# Patient Record
Sex: Male | Born: 1955 | Race: White | Hispanic: No | Smoking: Current some day smoker
Health system: Southern US, Community
[De-identification: ages and names within clinical notes are randomized; demographics above are authoritative.]

## PROBLEM LIST (undated history)

## (undated) DIAGNOSIS — Z8669 Personal history of other diseases of the nervous system and sense organs: Secondary | ICD-10-CM

## (undated) DIAGNOSIS — G2581 Restless legs syndrome: Secondary | ICD-10-CM

## (undated) DIAGNOSIS — J45909 Unspecified asthma, uncomplicated: Secondary | ICD-10-CM

## (undated) DIAGNOSIS — I2510 Atherosclerotic heart disease of native coronary artery without angina pectoris: Secondary | ICD-10-CM

## (undated) DIAGNOSIS — I5021 Acute systolic (congestive) heart failure: Secondary | ICD-10-CM

## (undated) DIAGNOSIS — H9191 Unspecified hearing loss, right ear: Secondary | ICD-10-CM

## (undated) DIAGNOSIS — S12100A Unspecified displaced fracture of second cervical vertebra, initial encounter for closed fracture: Secondary | ICD-10-CM

## (undated) DIAGNOSIS — I2119 ST elevation (STEMI) myocardial infarction involving other coronary artery of inferior wall: Secondary | ICD-10-CM

## (undated) DIAGNOSIS — M1990 Unspecified osteoarthritis, unspecified site: Secondary | ICD-10-CM

## (undated) DIAGNOSIS — G4700 Insomnia, unspecified: Secondary | ICD-10-CM

## (undated) DIAGNOSIS — J449 Chronic obstructive pulmonary disease, unspecified: Secondary | ICD-10-CM

## (undated) DIAGNOSIS — F419 Anxiety disorder, unspecified: Secondary | ICD-10-CM

## (undated) DIAGNOSIS — I10 Essential (primary) hypertension: Secondary | ICD-10-CM

## (undated) DIAGNOSIS — S7291XA Unspecified fracture of right femur, initial encounter for closed fracture: Secondary | ICD-10-CM

## (undated) DIAGNOSIS — K219 Gastro-esophageal reflux disease without esophagitis: Secondary | ICD-10-CM

## (undated) DIAGNOSIS — E785 Hyperlipidemia, unspecified: Secondary | ICD-10-CM

## (undated) DIAGNOSIS — F329 Major depressive disorder, single episode, unspecified: Secondary | ICD-10-CM

## (undated) HISTORY — DX: Insomnia, unspecified: G47.00

## (undated) HISTORY — PX: TONSILLECTOMY: SUR1361

## (undated) HISTORY — PX: CORONARY ARTERY BYPASS GRAFT: SHX141

## (undated) HISTORY — DX: Anxiety disorder, unspecified: F41.9

## (undated) HISTORY — DX: Chronic obstructive pulmonary disease, unspecified: J44.9

## (undated) HISTORY — DX: Gastro-esophageal reflux disease without esophagitis: K21.9

## (undated) HISTORY — DX: Major depressive disorder, single episode, unspecified: F32.9

## (undated) HISTORY — DX: Unspecified osteoarthritis, unspecified site: M19.90

## (undated) HISTORY — DX: Restless legs syndrome: G25.81

## (undated) HISTORY — DX: Unspecified asthma, uncomplicated: J45.909

## (undated) HISTORY — PX: TRACHEOSTOMY: SUR1362

## (undated) HISTORY — DX: Personal history of other diseases of the nervous system and sense organs: Z86.69

## (undated) HISTORY — DX: Hyperlipidemia, unspecified: E78.5

## (undated) HISTORY — PX: CORONARY STENT PLACEMENT: SHX1402

## (undated) HISTORY — DX: Essential (primary) hypertension: I10

## (undated) HISTORY — DX: Atherosclerotic heart disease of native coronary artery without angina pectoris: I25.10

## (undated) HISTORY — DX: Unspecified hearing loss, right ear: H91.91

---
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
## 1984-03-13 DIAGNOSIS — I10 Essential (primary) hypertension: Secondary | ICD-10-CM

## 1984-03-13 HISTORY — DX: Essential (primary) hypertension: I10

## 1988-03-13 DIAGNOSIS — F32A Depression, unspecified: Secondary | ICD-10-CM

## 1988-03-13 DIAGNOSIS — K219 Gastro-esophageal reflux disease without esophagitis: Secondary | ICD-10-CM

## 1988-03-13 DIAGNOSIS — F419 Anxiety disorder, unspecified: Secondary | ICD-10-CM

## 1988-03-13 HISTORY — DX: Anxiety disorder, unspecified: F41.9

## 1988-03-13 HISTORY — DX: Depression, unspecified: F32.A

## 1988-03-13 HISTORY — DX: Gastro-esophageal reflux disease without esophagitis: K21.9

## 1994-03-13 DIAGNOSIS — E785 Hyperlipidemia, unspecified: Secondary | ICD-10-CM

## 1994-03-13 HISTORY — DX: Hyperlipidemia, unspecified: E78.5

## 1997-10-07 ENCOUNTER — Ambulatory Visit (HOSPITAL_COMMUNITY): Admission: RE | Admit: 1997-10-07 | Discharge: 1997-10-07 | Payer: Self-pay | Admitting: Family Medicine

## 1997-10-26 ENCOUNTER — Encounter: Admission: RE | Admit: 1997-10-26 | Discharge: 1998-01-24 | Payer: Self-pay | Admitting: *Deleted

## 1998-03-02 ENCOUNTER — Inpatient Hospital Stay (HOSPITAL_COMMUNITY): Admission: EM | Admit: 1998-03-02 | Discharge: 1998-03-03 | Payer: Self-pay | Admitting: Emergency Medicine

## 1998-03-02 ENCOUNTER — Encounter: Payer: Self-pay | Admitting: Emergency Medicine

## 1998-03-03 ENCOUNTER — Encounter: Payer: Self-pay | Admitting: Cardiology

## 1999-03-14 DIAGNOSIS — M1990 Unspecified osteoarthritis, unspecified site: Secondary | ICD-10-CM

## 1999-03-14 HISTORY — DX: Unspecified osteoarthritis, unspecified site: M19.90

## 1999-05-06 ENCOUNTER — Encounter: Payer: Self-pay | Admitting: Emergency Medicine

## 1999-05-06 ENCOUNTER — Emergency Department (HOSPITAL_COMMUNITY): Admission: EM | Admit: 1999-05-06 | Discharge: 1999-05-06 | Payer: Self-pay | Admitting: Emergency Medicine

## 1999-05-13 ENCOUNTER — Emergency Department (HOSPITAL_COMMUNITY): Admission: EM | Admit: 1999-05-13 | Discharge: 1999-05-13 | Payer: Self-pay | Admitting: Emergency Medicine

## 2001-05-21 ENCOUNTER — Emergency Department (HOSPITAL_COMMUNITY): Admission: EM | Admit: 2001-05-21 | Discharge: 2001-05-21 | Payer: Self-pay | Admitting: Emergency Medicine

## 2001-05-21 ENCOUNTER — Encounter: Payer: Self-pay | Admitting: Emergency Medicine

## 2001-12-17 ENCOUNTER — Emergency Department (HOSPITAL_COMMUNITY): Admission: EM | Admit: 2001-12-17 | Discharge: 2001-12-17 | Payer: Self-pay | Admitting: Emergency Medicine

## 2001-12-17 ENCOUNTER — Encounter: Payer: Self-pay | Admitting: Emergency Medicine

## 2002-02-24 ENCOUNTER — Ambulatory Visit (HOSPITAL_COMMUNITY): Admission: RE | Admit: 2002-02-24 | Discharge: 2002-02-24 | Payer: Self-pay | Admitting: Internal Medicine

## 2002-02-24 ENCOUNTER — Encounter: Payer: Self-pay | Admitting: Internal Medicine

## 2002-08-13 ENCOUNTER — Encounter: Payer: Self-pay | Admitting: *Deleted

## 2002-08-13 ENCOUNTER — Encounter: Admission: RE | Admit: 2002-08-13 | Discharge: 2002-08-13 | Payer: Self-pay | Admitting: *Deleted

## 2002-08-26 ENCOUNTER — Encounter: Admission: RE | Admit: 2002-08-26 | Discharge: 2002-08-26 | Payer: Self-pay | Admitting: *Deleted

## 2002-08-26 ENCOUNTER — Encounter: Payer: Self-pay | Admitting: *Deleted

## 2002-09-09 ENCOUNTER — Encounter: Payer: Self-pay | Admitting: *Deleted

## 2002-09-09 ENCOUNTER — Encounter: Admission: RE | Admit: 2002-09-09 | Discharge: 2002-09-09 | Payer: Self-pay | Admitting: *Deleted

## 2004-01-26 ENCOUNTER — Encounter: Admission: RE | Admit: 2004-01-26 | Discharge: 2004-01-26 | Payer: Self-pay | Admitting: Family Medicine

## 2004-02-12 ENCOUNTER — Encounter: Admission: RE | Admit: 2004-02-12 | Discharge: 2004-02-12 | Payer: Self-pay | Admitting: Family Medicine

## 2004-02-25 ENCOUNTER — Encounter: Admission: RE | Admit: 2004-02-25 | Discharge: 2004-02-25 | Payer: Self-pay | Admitting: Family Medicine

## 2004-03-13 DIAGNOSIS — J449 Chronic obstructive pulmonary disease, unspecified: Secondary | ICD-10-CM

## 2004-03-13 HISTORY — DX: Chronic obstructive pulmonary disease, unspecified: J44.9

## 2004-08-19 ENCOUNTER — Encounter: Admission: RE | Admit: 2004-08-19 | Discharge: 2004-08-19 | Payer: Self-pay | Admitting: Family Medicine

## 2010-05-18 DIAGNOSIS — R0609 Other forms of dyspnea: Secondary | ICD-10-CM | POA: Insufficient documentation

## 2010-05-18 DIAGNOSIS — R0602 Shortness of breath: Secondary | ICD-10-CM | POA: Insufficient documentation

## 2010-09-12 DIAGNOSIS — R059 Cough, unspecified: Secondary | ICD-10-CM | POA: Insufficient documentation

## 2010-09-12 DIAGNOSIS — J40 Bronchitis, not specified as acute or chronic: Secondary | ICD-10-CM | POA: Insufficient documentation

## 2010-09-12 DIAGNOSIS — R05 Cough: Secondary | ICD-10-CM | POA: Insufficient documentation

## 2012-03-20 DIAGNOSIS — Z1389 Encounter for screening for other disorder: Secondary | ICD-10-CM | POA: Insufficient documentation

## 2012-12-07 DIAGNOSIS — E785 Hyperlipidemia, unspecified: Secondary | ICD-10-CM | POA: Insufficient documentation

## 2012-12-11 DIAGNOSIS — I2510 Atherosclerotic heart disease of native coronary artery without angina pectoris: Secondary | ICD-10-CM

## 2012-12-11 HISTORY — DX: Atherosclerotic heart disease of native coronary artery without angina pectoris: I25.10

## 2013-01-16 DIAGNOSIS — I10 Essential (primary) hypertension: Secondary | ICD-10-CM | POA: Insufficient documentation

## 2013-02-12 DIAGNOSIS — Z951 Presence of aortocoronary bypass graft: Secondary | ICD-10-CM | POA: Insufficient documentation

## 2013-05-07 DIAGNOSIS — I2119 ST elevation (STEMI) myocardial infarction involving other coronary artery of inferior wall: Secondary | ICD-10-CM | POA: Insufficient documentation

## 2013-05-07 HISTORY — DX: ST elevation (STEMI) myocardial infarction involving other coronary artery of inferior wall: I21.19

## 2013-08-22 DIAGNOSIS — N179 Acute kidney failure, unspecified: Secondary | ICD-10-CM | POA: Insufficient documentation

## 2014-03-13 DIAGNOSIS — H9191 Unspecified hearing loss, right ear: Secondary | ICD-10-CM

## 2014-03-13 HISTORY — PX: ORIF FEMUR FRACTURE: SHX2119

## 2014-03-13 HISTORY — PX: CERVICAL FUSION: SHX112

## 2014-03-13 HISTORY — DX: Unspecified hearing loss, right ear: H91.91

## 2014-08-15 DIAGNOSIS — F329 Major depressive disorder, single episode, unspecified: Secondary | ICD-10-CM | POA: Insufficient documentation

## 2014-08-15 DIAGNOSIS — F32A Depression, unspecified: Secondary | ICD-10-CM | POA: Insufficient documentation

## 2014-09-20 DIAGNOSIS — S0101XA Laceration without foreign body of scalp, initial encounter: Secondary | ICD-10-CM | POA: Insufficient documentation

## 2014-09-20 DIAGNOSIS — S12100A Unspecified displaced fracture of second cervical vertebra, initial encounter for closed fracture: Secondary | ICD-10-CM

## 2014-09-20 DIAGNOSIS — T1490XA Injury, unspecified, initial encounter: Secondary | ICD-10-CM | POA: Insufficient documentation

## 2014-09-20 HISTORY — DX: Unspecified displaced fracture of second cervical vertebra, initial encounter for closed fracture: S12.100A

## 2014-09-22 DIAGNOSIS — S7291XA Unspecified fracture of right femur, initial encounter for closed fracture: Secondary | ICD-10-CM | POA: Insufficient documentation

## 2014-09-22 DIAGNOSIS — J810 Acute pulmonary edema: Secondary | ICD-10-CM | POA: Insufficient documentation

## 2014-09-22 DIAGNOSIS — N179 Acute kidney failure, unspecified: Secondary | ICD-10-CM | POA: Insufficient documentation

## 2014-09-22 DIAGNOSIS — M532X2 Spinal instabilities, cervical region: Secondary | ICD-10-CM | POA: Insufficient documentation

## 2014-09-22 HISTORY — DX: Unspecified fracture of right femur, initial encounter for closed fracture: S72.91XA

## 2015-01-04 ENCOUNTER — Encounter: Payer: Self-pay | Admitting: Physician Assistant

## 2015-01-04 ENCOUNTER — Ambulatory Visit: Payer: Self-pay | Admitting: Physician Assistant

## 2015-01-04 VITALS — BP 168/100 | HR 102 | Temp 98.2°F | Ht 71.0 in | Wt 206.6 lb

## 2015-01-04 DIAGNOSIS — E785 Hyperlipidemia, unspecified: Secondary | ICD-10-CM

## 2015-01-04 DIAGNOSIS — I2510 Atherosclerotic heart disease of native coronary artery without angina pectoris: Secondary | ICD-10-CM | POA: Insufficient documentation

## 2015-01-04 DIAGNOSIS — Z131 Encounter for screening for diabetes mellitus: Secondary | ICD-10-CM

## 2015-01-04 DIAGNOSIS — I10 Essential (primary) hypertension: Secondary | ICD-10-CM | POA: Insufficient documentation

## 2015-01-04 DIAGNOSIS — J449 Chronic obstructive pulmonary disease, unspecified: Secondary | ICD-10-CM

## 2015-01-04 LAB — GLUCOSE, POCT (MANUAL RESULT ENTRY): POC Glucose: 108 mg/dl — AB (ref 70–99)

## 2015-01-04 MED ORDER — OMEPRAZOLE 20 MG PO CPDR: 20.0000 mg | DELAYED_RELEASE_CAPSULE | Freq: Every day | ORAL | Status: DC

## 2015-01-04 MED ORDER — METOPROLOL SUCCINATE ER 25 MG PO TB24: 25.0000 mg | ORAL_TABLET | Freq: Every day | ORAL | Status: DC

## 2015-01-04 MED ORDER — CLOPIDOGREL BISULFATE 75 MG PO TABS: 75.0000 mg | ORAL_TABLET | Freq: Every day | ORAL | Status: DC

## 2015-01-04 MED ORDER — ATORVASTATIN CALCIUM 80 MG PO TABS: 80.0000 mg | ORAL_TABLET | Freq: Every day | ORAL | Status: DC
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
## 2015-02-09 ENCOUNTER — Ambulatory Visit: Payer: Self-pay | Admitting: Physician Assistant

## 2015-03-14 DIAGNOSIS — I719 Aortic aneurysm of unspecified site, without rupture: Secondary | ICD-10-CM | POA: Insufficient documentation

## 2015-03-14 HISTORY — DX: Aortic aneurysm of unspecified site, without rupture: I71.9

## 2015-04-09 ENCOUNTER — Other Ambulatory Visit: Payer: Self-pay | Admitting: Physician Assistant

## 2015-11-22 DIAGNOSIS — F17200 Nicotine dependence, unspecified, uncomplicated: Secondary | ICD-10-CM | POA: Insufficient documentation

## 2015-11-22 DIAGNOSIS — J449 Chronic obstructive pulmonary disease, unspecified: Secondary | ICD-10-CM | POA: Insufficient documentation

## 2015-11-25 ENCOUNTER — Ambulatory Visit (HOSPITAL_COMMUNITY)
Admission: RE | Admit: 2015-11-25 | Discharge: 2015-11-25 | Disposition: A | Discharge status: Home / Self Care | Payer: Medicaid Other | Source: Ambulatory Visit | Attending: Physician Assistant | Admitting: Physician Assistant

## 2015-11-25 ENCOUNTER — Other Ambulatory Visit (HOSPITAL_COMMUNITY): Payer: Self-pay | Admitting: Physician Assistant

## 2015-11-25 DIAGNOSIS — R918 Other nonspecific abnormal finding of lung field: Secondary | ICD-10-CM | POA: Diagnosis not present

## 2015-11-25 DIAGNOSIS — R0602 Shortness of breath: Secondary | ICD-10-CM

## 2015-11-25 DIAGNOSIS — I700 Atherosclerosis of aorta: Secondary | ICD-10-CM | POA: Insufficient documentation

## 2015-11-29 ENCOUNTER — Other Ambulatory Visit (HOSPITAL_COMMUNITY): Payer: Self-pay | Admitting: Physician Assistant

## 2015-11-29 DIAGNOSIS — R918 Other nonspecific abnormal finding of lung field: Secondary | ICD-10-CM

## 2015-12-07 ENCOUNTER — Ambulatory Visit (HOSPITAL_COMMUNITY)
Admission: RE | Admit: 2015-12-07 | Discharge: 2015-12-07 | Disposition: A | Discharge status: Home / Self Care | Payer: Medicaid Other | Source: Ambulatory Visit | Attending: Physician Assistant | Admitting: Physician Assistant

## 2015-12-07 DIAGNOSIS — I2510 Atherosclerotic heart disease of native coronary artery without angina pectoris: Secondary | ICD-10-CM | POA: Insufficient documentation

## 2015-12-07 DIAGNOSIS — I700 Atherosclerosis of aorta: Secondary | ICD-10-CM | POA: Diagnosis not present

## 2015-12-07 DIAGNOSIS — Z951 Presence of aortocoronary bypass graft: Secondary | ICD-10-CM | POA: Insufficient documentation

## 2015-12-07 DIAGNOSIS — I712 Thoracic aortic aneurysm, without rupture: Secondary | ICD-10-CM | POA: Diagnosis not present

## 2015-12-07 DIAGNOSIS — R918 Other nonspecific abnormal finding of lung field: Secondary | ICD-10-CM | POA: Insufficient documentation

## 2015-12-07 MED ORDER — IOPAMIDOL (ISOVUE-300) INJECTION 61%
75.0000 mL | Freq: Once | INTRAVENOUS | Status: AC | PRN
  Administered 2015-12-07: 75 mL via INTRAVENOUS

## 2015-12-14 ENCOUNTER — Ambulatory Visit (HOSPITAL_COMMUNITY): Payer: Medicaid Other

## 2015-12-21 ENCOUNTER — Encounter: Payer: Self-pay | Admitting: Thoracic Surgery (Cardiothoracic Vascular Surgery)

## 2015-12-21 ENCOUNTER — Institutional Professional Consult (permissible substitution) (INDEPENDENT_AMBULATORY_CARE_PROVIDER_SITE_OTHER): Payer: Medicaid Other | Admitting: Thoracic Surgery (Cardiothoracic Vascular Surgery)

## 2015-12-21 VITALS — BP 111/77 | HR 89 | Resp 20 | Ht 71.0 in | Wt 218.0 lb

## 2015-12-21 DIAGNOSIS — I712 Thoracic aortic aneurysm, without rupture, unspecified: Secondary | ICD-10-CM

## 2015-12-21 DIAGNOSIS — R079 Chest pain, unspecified: Secondary | ICD-10-CM | POA: Insufficient documentation
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
## 2015-12-28 ENCOUNTER — Institutional Professional Consult (permissible substitution): Payer: Medicaid Other | Admitting: Internal Medicine

## 2016-01-13 ENCOUNTER — Institutional Professional Consult (permissible substitution): Payer: Medicaid Other | Admitting: Internal Medicine

## 2016-11-29 ENCOUNTER — Other Ambulatory Visit: Payer: Self-pay | Admitting: *Deleted

## 2016-11-29 DIAGNOSIS — I712 Thoracic aortic aneurysm, without rupture, unspecified: Secondary | ICD-10-CM

## 2017-01-09 ENCOUNTER — Inpatient Hospital Stay: Admission: RE | Admit: 2017-01-09 | Payer: Self-pay | Source: Ambulatory Visit

## 2017-01-09 ENCOUNTER — Encounter: Payer: Self-pay | Admitting: Thoracic Surgery (Cardiothoracic Vascular Surgery)

## 2017-03-11 ENCOUNTER — Emergency Department (HOSPITAL_COMMUNITY): Payer: Medicaid Other

## 2017-03-11 ENCOUNTER — Encounter (HOSPITAL_COMMUNITY)
Admission: EM | Disposition: A | Discharge status: Home / Self Care | Payer: Self-pay | Source: Home / Self Care | Attending: Cardiovascular Disease

## 2017-03-11 ENCOUNTER — Encounter (HOSPITAL_COMMUNITY): Payer: Self-pay | Admitting: *Deleted

## 2017-03-11 ENCOUNTER — Inpatient Hospital Stay (HOSPITAL_COMMUNITY)
Admission: EM | Admit: 2017-03-11 | Discharge: 2017-03-14 | DRG: 228 | Disposition: A | Discharge status: Home / Self Care | Payer: Medicaid Other | Attending: Cardiovascular Disease | Admitting: Cardiovascular Disease

## 2017-03-11 DIAGNOSIS — Z79899 Other long term (current) drug therapy: Secondary | ICD-10-CM

## 2017-03-11 DIAGNOSIS — I110 Hypertensive heart disease with heart failure: Secondary | ICD-10-CM | POA: Diagnosis present

## 2017-03-11 DIAGNOSIS — Z955 Presence of coronary angioplasty implant and graft: Secondary | ICD-10-CM | POA: Diagnosis not present

## 2017-03-11 DIAGNOSIS — Z9114 Patient's other noncompliance with medication regimen: Secondary | ICD-10-CM

## 2017-03-11 DIAGNOSIS — F419 Anxiety disorder, unspecified: Secondary | ICD-10-CM | POA: Diagnosis present

## 2017-03-11 DIAGNOSIS — I2119 ST elevation (STEMI) myocardial infarction involving other coronary artery of inferior wall: Secondary | ICD-10-CM | POA: Diagnosis present

## 2017-03-11 DIAGNOSIS — I10 Essential (primary) hypertension: Secondary | ICD-10-CM | POA: Diagnosis present

## 2017-03-11 DIAGNOSIS — Z981 Arthrodesis status: Secondary | ICD-10-CM

## 2017-03-11 DIAGNOSIS — F17210 Nicotine dependence, cigarettes, uncomplicated: Secondary | ICD-10-CM | POA: Diagnosis present

## 2017-03-11 DIAGNOSIS — I5023 Acute on chronic systolic (congestive) heart failure: Secondary | ICD-10-CM | POA: Diagnosis present

## 2017-03-11 DIAGNOSIS — J440 Chronic obstructive pulmonary disease with acute lower respiratory infection: Secondary | ICD-10-CM | POA: Diagnosis present

## 2017-03-11 DIAGNOSIS — Z9119 Patient's noncompliance with other medical treatment and regimen: Secondary | ICD-10-CM

## 2017-03-11 DIAGNOSIS — Z599 Problem related to housing and economic circumstances, unspecified: Secondary | ICD-10-CM

## 2017-03-11 DIAGNOSIS — J189 Pneumonia, unspecified organism: Secondary | ICD-10-CM | POA: Diagnosis present

## 2017-03-11 DIAGNOSIS — K219 Gastro-esophageal reflux disease without esophagitis: Secondary | ICD-10-CM | POA: Diagnosis present

## 2017-03-11 DIAGNOSIS — I712 Thoracic aortic aneurysm, without rupture: Secondary | ICD-10-CM | POA: Diagnosis present

## 2017-03-11 DIAGNOSIS — I252 Old myocardial infarction: Secondary | ICD-10-CM

## 2017-03-11 DIAGNOSIS — F329 Major depressive disorder, single episode, unspecified: Secondary | ICD-10-CM | POA: Diagnosis present

## 2017-03-11 DIAGNOSIS — Z9981 Dependence on supplemental oxygen: Secondary | ICD-10-CM

## 2017-03-11 DIAGNOSIS — I5043 Acute on chronic combined systolic (congestive) and diastolic (congestive) heart failure: Secondary | ICD-10-CM | POA: Diagnosis present

## 2017-03-11 DIAGNOSIS — I213 ST elevation (STEMI) myocardial infarction of unspecified site: Secondary | ICD-10-CM

## 2017-03-11 DIAGNOSIS — H9191 Unspecified hearing loss, right ear: Secondary | ICD-10-CM | POA: Diagnosis present

## 2017-03-11 DIAGNOSIS — I2111 ST elevation (STEMI) myocardial infarction involving right coronary artery: Principal | ICD-10-CM | POA: Diagnosis present

## 2017-03-11 DIAGNOSIS — R0602 Shortness of breath: Secondary | ICD-10-CM

## 2017-03-11 DIAGNOSIS — Z8249 Family history of ischemic heart disease and other diseases of the circulatory system: Secondary | ICD-10-CM | POA: Diagnosis not present

## 2017-03-11 DIAGNOSIS — J449 Chronic obstructive pulmonary disease, unspecified: Secondary | ICD-10-CM

## 2017-03-11 DIAGNOSIS — I25709 Atherosclerosis of coronary artery bypass graft(s), unspecified, with unspecified angina pectoris: Secondary | ICD-10-CM | POA: Diagnosis present

## 2017-03-11 DIAGNOSIS — E785 Hyperlipidemia, unspecified: Secondary | ICD-10-CM | POA: Diagnosis present

## 2017-03-11 DIAGNOSIS — I2510 Atherosclerotic heart disease of native coronary artery without angina pectoris: Secondary | ICD-10-CM | POA: Diagnosis present

## 2017-03-11 DIAGNOSIS — Z951 Presence of aortocoronary bypass graft: Secondary | ICD-10-CM

## 2017-03-11 HISTORY — DX: Acute systolic (congestive) heart failure: I50.21

## 2017-03-11 HISTORY — PX: CORONARY/GRAFT ACUTE MI REVASCULARIZATION: CATH118305

## 2017-03-11 HISTORY — PX: LEFT HEART CATH AND CORONARY ANGIOGRAPHY: CATH118249

## 2017-03-11 HISTORY — PX: CORONARY THROMBECTOMY: CATH118304

## 2017-03-11 HISTORY — PX: CORONARY STENT INTERVENTION: CATH118234

## 2017-03-11 LAB — BASIC METABOLIC PANEL
ANION GAP: 13 (ref 5–15)
BUN: 16 mg/dL (ref 6–20)
CALCIUM: 9.1 mg/dL (ref 8.9–10.3)
CO2: 23 mmol/L (ref 22–32)
Chloride: 102 mmol/L (ref 101–111)
Creatinine, Ser: 1.21 mg/dL (ref 0.61–1.24)
Glucose, Bld: 109 mg/dL — ABNORMAL HIGH (ref 65–99)
POTASSIUM: 4.1 mmol/L (ref 3.5–5.1)
Sodium: 138 mmol/L (ref 135–145)

## 2017-03-11 LAB — CBC WITH DIFFERENTIAL/PLATELET
BASOS ABS: 0 10*3/uL (ref 0.0–0.1)
BASOS PCT: 0 %
Eosinophils Absolute: 0.2 10*3/uL (ref 0.0–0.7)
Eosinophils Relative: 1 %
HEMATOCRIT: 50.3 % (ref 39.0–52.0)
HEMOGLOBIN: 17.1 g/dL — AB (ref 13.0–17.0)
LYMPHS PCT: 26 %
Lymphs Abs: 3.7 10*3/uL (ref 0.7–4.0)
MCH: 31.8 pg (ref 26.0–34.0)
MCHC: 34 g/dL (ref 30.0–36.0)
MCV: 93.7 fL (ref 78.0–100.0)
Monocytes Absolute: 0.5 10*3/uL (ref 0.1–1.0)
Monocytes Relative: 4 %
NEUTROS ABS: 9.6 10*3/uL — AB (ref 1.7–7.7)
NEUTROS PCT: 69 %
Platelets: 271 10*3/uL (ref 150–400)
RBC: 5.37 MIL/uL (ref 4.22–5.81)
RDW: 14 % (ref 11.5–15.5)
WBC: 14 10*3/uL — ABNORMAL HIGH (ref 4.0–10.5)

## 2017-03-11 LAB — I-STAT CHEM 8, ED
BUN: 19 mg/dL (ref 6–20)
Calcium, Ion: 1.06 mmol/L — ABNORMAL LOW (ref 1.15–1.40)
Chloride: 104 mmol/L (ref 101–111)
Creatinine, Ser: 1.2 mg/dL (ref 0.61–1.24)
GLUCOSE: 110 mg/dL — AB (ref 65–99)
HEMATOCRIT: 53 % — AB (ref 39.0–52.0)
HEMOGLOBIN: 18 g/dL — AB (ref 13.0–17.0)
POTASSIUM: 4.2 mmol/L (ref 3.5–5.1)
SODIUM: 140 mmol/L (ref 135–145)
TCO2: 23 mmol/L (ref 22–32)

## 2017-03-11 LAB — HEMOGLOBIN A1C
HEMOGLOBIN A1C: 5.3 % (ref 4.8–5.6)
MEAN PLASMA GLUCOSE: 105.41 mg/dL

## 2017-03-11 LAB — TROPONIN I: TROPONIN I: 0.04 ng/mL — AB (ref <0.03)

## 2017-03-11 LAB — I-STAT TROPONIN, ED: Troponin i, poc: 0.01 ng/mL (ref 0.00–0.08)

## 2017-03-11 LAB — BRAIN NATRIURETIC PEPTIDE: B NATRIURETIC PEPTIDE 5: 43.8 pg/mL (ref 0.0–100.0)

## 2017-03-11 SURGERY — LEFT HEART CATH AND CORONARY ANGIOGRAPHY
Anesthesia: LOCAL

## 2017-03-11 MED ORDER — IOPAMIDOL (ISOVUE-370) INJECTION 76%
INTRAVENOUS | Status: AC
  Filled 2017-03-11: qty 100

## 2017-03-11 MED ORDER — HEPARIN SODIUM (PORCINE) 1000 UNIT/ML IJ SOLN
INTRAMUSCULAR | Status: AC
  Filled 2017-03-11: qty 1

## 2017-03-11 MED ORDER — FENTANYL CITRATE (PF) 100 MCG/2ML IJ SOLN
INTRAMUSCULAR | Status: AC
  Filled 2017-03-11: qty 2

## 2017-03-11 MED ORDER — ADENOSINE 6 MG/2ML IV SOLN
INTRAVENOUS | Status: AC
  Filled 2017-03-11: qty 2

## 2017-03-11 MED ORDER — TICAGRELOR 90 MG PO TABS
ORAL_TABLET | ORAL | Status: AC
  Filled 2017-03-11: qty 2

## 2017-03-11 MED ORDER — ASPIRIN EC 81 MG PO TBEC
81.0000 mg | DELAYED_RELEASE_TABLET | Freq: Every day | ORAL | Status: DC
  Administered 2017-03-12 – 2017-03-14 (×3): 81 mg via ORAL
  Filled 2017-03-11 (×3): qty 1

## 2017-03-11 MED ORDER — LABETALOL HCL 5 MG/ML IV SOLN: 10.0000 mg | INTRAVENOUS | Status: AC | PRN

## 2017-03-11 MED ORDER — VERAPAMIL HCL 2.5 MG/ML IV SOLN
INTRAVENOUS | Status: DC | PRN
  Administered 2017-03-11: 100 ug via INTRACORONARY
  Administered 2017-03-11: 200 ug via INTRACORONARY
  Administered 2017-03-11: 100 ug via INTRACORONARY
  Administered 2017-03-11: 200 ug via INTRACORONARY
  Administered 2017-03-11 (×4): 100 ug via INTRACORONARY

## 2017-03-11 MED ORDER — HYDRALAZINE HCL 20 MG/ML IJ SOLN
5.0000 mg | INTRAMUSCULAR | Status: AC | PRN
  Filled 2017-03-11: qty 1

## 2017-03-11 MED ORDER — MIDAZOLAM HCL 2 MG/2ML IJ SOLN
INTRAMUSCULAR | Status: AC
  Filled 2017-03-11: qty 2

## 2017-03-11 MED ORDER — MIDAZOLAM HCL 2 MG/2ML IJ SOLN
INTRAMUSCULAR | Status: DC | PRN
  Administered 2017-03-11 (×2): 1 mg via INTRAVENOUS

## 2017-03-11 MED ORDER — LIDOCAINE HCL (PF) 1 % IJ SOLN
INTRAMUSCULAR | Status: AC
  Filled 2017-03-11: qty 30

## 2017-03-11 MED ORDER — NITROGLYCERIN 1 MG/10 ML FOR IR/CATH LAB
INTRA_ARTERIAL | Status: AC
  Filled 2017-03-11: qty 10

## 2017-03-11 MED ORDER — LIDOCAINE HCL (PF) 1 % IJ SOLN
INTRAMUSCULAR | Status: DC | PRN
  Administered 2017-03-11: 15 mL

## 2017-03-11 MED ORDER — NITROGLYCERIN 0.4 MG SL SUBL: 0.4000 mg | SUBLINGUAL_TABLET | SUBLINGUAL | Status: DC | PRN

## 2017-03-11 MED ORDER — BIVALIRUDIN TRIFLUOROACETATE 250 MG IV SOLR
INTRAVENOUS | Status: AC
  Filled 2017-03-11: qty 250

## 2017-03-11 MED ORDER — FENTANYL CITRATE (PF) 100 MCG/2ML IJ SOLN
INTRAMUSCULAR | Status: DC | PRN
  Administered 2017-03-11 (×2): 25 ug via INTRAVENOUS

## 2017-03-11 MED ORDER — METOPROLOL SUCCINATE ER 25 MG PO TB24
25.0000 mg | ORAL_TABLET | Freq: Every day | ORAL | Status: DC
  Administered 2017-03-12 – 2017-03-14 (×3): 25 mg via ORAL
  Filled 2017-03-11 (×3): qty 1

## 2017-03-11 MED ORDER — PANTOPRAZOLE SODIUM 40 MG PO TBEC
40.0000 mg | DELAYED_RELEASE_TABLET | Freq: Every day | ORAL | Status: DC
  Administered 2017-03-12 – 2017-03-14 (×3): 40 mg via ORAL
  Filled 2017-03-11 (×3): qty 1

## 2017-03-11 MED ORDER — HEPARIN (PORCINE) IN NACL 2-0.9 UNIT/ML-% IJ SOLN
INTRAMUSCULAR | Status: AC
  Filled 2017-03-11: qty 500

## 2017-03-11 MED ORDER — SODIUM CHLORIDE 0.9 % WEIGHT BASED INFUSION
1.0000 mL/kg/h | INTRAVENOUS | Status: AC
  Administered 2017-03-11: 1 mL/kg/h via INTRAVENOUS

## 2017-03-11 MED ORDER — SODIUM CHLORIDE 0.9 % IV SOLN
INTRAVENOUS | Status: AC | PRN
  Administered 2017-03-11 (×2): 1.75 mg/kg/h via INTRAVENOUS

## 2017-03-11 MED ORDER — SODIUM CHLORIDE 0.9% FLUSH
3.0000 mL | INTRAVENOUS | Status: DC | PRN
  Administered 2017-03-14: 3 mL via INTRAVENOUS
  Filled 2017-03-11: qty 3

## 2017-03-11 MED ORDER — LIDOCAINE-EPINEPHRINE 1 %-1:100000 IJ SOLN
INTRAMUSCULAR | Status: AC
Start: 2017-03-11 — End: ?
  Filled 2017-03-11: qty 1

## 2017-03-11 MED ORDER — ONDANSETRON HCL 4 MG/2ML IJ SOLN
4.0000 mg | Freq: Four times a day (QID) | INTRAMUSCULAR | Status: DC | PRN
  Administered 2017-03-12 – 2017-03-13 (×3): 4 mg via INTRAVENOUS
  Filled 2017-03-11 (×3): qty 2

## 2017-03-11 MED ORDER — OXYCODONE HCL 5 MG PO TABS
5.0000 mg | ORAL_TABLET | ORAL | Status: DC | PRN
  Administered 2017-03-13: 5 mg via ORAL
  Filled 2017-03-11: qty 1

## 2017-03-11 MED ORDER — VERAPAMIL HCL 2.5 MG/ML IV SOLN
INTRAVENOUS | Status: AC
  Filled 2017-03-11: qty 2

## 2017-03-11 MED ORDER — VERAPAMIL HCL 2.5 MG/ML IV SOLN
INTRAVENOUS | Status: DC | PRN
  Administered 2017-03-11: 200 ug via INTRACORONARY
  Administered 2017-03-11 (×2): 100 ug via INTRACORONARY

## 2017-03-11 MED ORDER — SODIUM CHLORIDE 0.9 % IV SOLN: 250.0000 mL | INTRAVENOUS | Status: DC | PRN

## 2017-03-11 MED ORDER — ESCITALOPRAM OXALATE 20 MG PO TABS
20.0000 mg | ORAL_TABLET | Freq: Every day | ORAL | Status: DC
  Administered 2017-03-12 – 2017-03-14 (×3): 20 mg via ORAL
  Filled 2017-03-11: qty 2
  Filled 2017-03-11: qty 1
  Filled 2017-03-11: qty 2

## 2017-03-11 MED ORDER — AMITRIPTYLINE HCL 10 MG PO TABS
10.0000 mg | ORAL_TABLET | Freq: Every day | ORAL | Status: DC
Start: 2017-03-12 — End: 2017-03-14
  Administered 2017-03-12 – 2017-03-14 (×3): 10 mg via ORAL
  Filled 2017-03-11 (×3): qty 1

## 2017-03-11 MED ORDER — ACETAMINOPHEN 325 MG PO TABS: 650.0000 mg | ORAL_TABLET | ORAL | Status: DC | PRN

## 2017-03-11 MED ORDER — HEPARIN BOLUS VIA INFUSION: 4000.0000 [IU] | Freq: Once | INTRAVENOUS | Status: DC

## 2017-03-11 MED ORDER — TICAGRELOR 90 MG PO TABS
ORAL_TABLET | ORAL | Status: DC | PRN
  Administered 2017-03-11: 180 mg via ORAL

## 2017-03-11 MED ORDER — HEPARIN (PORCINE) IN NACL 2-0.9 UNIT/ML-% IJ SOLN
INTRAMUSCULAR | Status: AC
Start: 2017-03-11 — End: ?
  Filled 2017-03-11: qty 1000

## 2017-03-11 MED ORDER — BIVALIRUDIN BOLUS VIA INFUSION - CUPID
INTRAVENOUS | Status: DC | PRN
  Administered 2017-03-11: 74.25 mg via INTRAVENOUS

## 2017-03-11 MED ORDER — ROPINIROLE HCL 1 MG PO TABS
1.0000 mg | ORAL_TABLET | Freq: Every day | ORAL | Status: DC
  Administered 2017-03-12 – 2017-03-14 (×3): 1 mg via ORAL
  Filled 2017-03-11 (×3): qty 1

## 2017-03-11 MED ORDER — NITROGLYCERIN 1 MG/10 ML FOR IR/CATH LAB
INTRA_ARTERIAL | Status: DC | PRN
  Administered 2017-03-11: 200 ug via INTRACORONARY

## 2017-03-11 MED ORDER — MORPHINE SULFATE (PF) 4 MG/ML IV SOLN
2.0000 mg | INTRAVENOUS | Status: DC | PRN
  Administered 2017-03-12 (×4): 2 mg via INTRAVENOUS
  Filled 2017-03-11 (×4): qty 1

## 2017-03-11 MED ORDER — ATORVASTATIN CALCIUM 80 MG PO TABS
80.0000 mg | ORAL_TABLET | Freq: Every day | ORAL | Status: DC
  Administered 2017-03-12 – 2017-03-14 (×3): 80 mg via ORAL
  Filled 2017-03-11 (×3): qty 1

## 2017-03-11 MED ORDER — TICAGRELOR 90 MG PO TABS
90.0000 mg | ORAL_TABLET | Freq: Two times a day (BID) | ORAL | Status: DC
  Administered 2017-03-12 – 2017-03-13 (×3): 90 mg via ORAL
  Filled 2017-03-11 (×3): qty 1

## 2017-03-11 MED ORDER — AMLODIPINE BESYLATE 10 MG PO TABS
10.0000 mg | ORAL_TABLET | Freq: Every day | ORAL | Status: DC
  Administered 2017-03-12 – 2017-03-13 (×2): 10 mg via ORAL
  Filled 2017-03-11 (×2): qty 1

## 2017-03-11 MED ORDER — IOPAMIDOL (ISOVUE-370) INJECTION 76%
INTRAVENOUS | Status: DC | PRN
  Administered 2017-03-11: 160 mL via INTRA_ARTERIAL

## 2017-03-11 MED ORDER — LIDOCAINE-EPINEPHRINE 1 %-1:100000 IJ SOLN
INTRAMUSCULAR | Status: DC | PRN
  Administered 2017-03-11: 3 mL

## 2017-03-11 MED ORDER — SODIUM CHLORIDE 0.9% FLUSH
3.0000 mL | Freq: Two times a day (BID) | INTRAVENOUS | Status: DC
  Administered 2017-03-12 – 2017-03-14 (×4): 3 mL via INTRAVENOUS

## 2017-03-11 MED ORDER — HEPARIN (PORCINE) IN NACL 2-0.9 UNIT/ML-% IJ SOLN
INTRAMUSCULAR | Status: AC | PRN
  Administered 2017-03-11: 1000 mL

## 2017-03-11 SURGICAL SUPPLY — 23 items
BALLN SAPPHIRE 2.5X12 (BALLOONS) ×2
BALLOON SAPPHIRE 2.5X12 (BALLOONS) ×1 IMPLANT
CATH ANGIOJET SPIRO ULTR 135CM (CATHETERS) ×2 IMPLANT
CATH INFINITI 5 FR JL6.0 (CATHETERS) ×2 IMPLANT
CATH INFINITI 5FR ANG PIGTAIL (CATHETERS) ×2 IMPLANT
CATH INFINITI 5FR JL4 (CATHETERS) ×2 IMPLANT
CATH INFINITI 5FR JL5 (CATHETERS) ×2 IMPLANT
CATH INFINITI JR4 5F (CATHETERS) ×2 IMPLANT
CATH S G BIP PACING (SET/KITS/TRAYS/PACK) ×2 IMPLANT
CATH VISTA GUIDE RCB (CATHETERS) ×2 IMPLANT
DEVICE CLOSURE PERCLS PRGLD 6F (VASCULAR PRODUCTS) ×1 IMPLANT
KIT ENCORE 26 ADVANTAGE (KITS) ×2 IMPLANT
KIT HEART LEFT (KITS) ×2 IMPLANT
PACK CARDIAC CATHETERIZATION (CUSTOM PROCEDURE TRAY) ×2 IMPLANT
PERCLOSE PROGLIDE 6F (VASCULAR PRODUCTS) ×2
SHEATH PINNACLE 5F 10CM (SHEATH) ×2 IMPLANT
SHEATH PINNACLE 6F 10CM (SHEATH) ×4 IMPLANT
SLEEVE REPOSITIONING LENGTH 30 (MISCELLANEOUS) ×2 IMPLANT
STENT SYNERGY DES 3.5X16 (Permanent Stent) ×2 IMPLANT
TRANSDUCER W/STOPCOCK (MISCELLANEOUS) ×2 IMPLANT
TUBING CIL FLEX 10 FLL-RA (TUBING) ×2 IMPLANT
VALVE GUARDIAN II ~~LOC~~ HEMO (MISCELLANEOUS) ×2 IMPLANT
WIRE COUGAR XT STRL 190CM (WIRE) ×2 IMPLANT
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
## 2017-03-11 NOTE — ED Triage Notes (Signed)
Pt reports severe cp that radiates to his L arm around 1730 today thinking it was acid refux.  Pt has hx of cardiac stents and CABG in 2014.

## 2017-03-11 NOTE — ED Notes (Signed)
Pt received Morphine 10mg , ASA 81mg  x 3, nitro SL x 1 en route

## 2017-03-12 ENCOUNTER — Inpatient Hospital Stay (HOSPITAL_COMMUNITY): Payer: Medicaid Other

## 2017-03-12 ENCOUNTER — Encounter (HOSPITAL_COMMUNITY): Payer: Self-pay | Admitting: Cardiovascular Disease

## 2017-03-12 DIAGNOSIS — Z951 Presence of aortocoronary bypass graft: Secondary | ICD-10-CM

## 2017-03-12 DIAGNOSIS — I25709 Atherosclerosis of coronary artery bypass graft(s), unspecified, with unspecified angina pectoris: Secondary | ICD-10-CM

## 2017-03-12 DIAGNOSIS — I10 Essential (primary) hypertension: Secondary | ICD-10-CM

## 2017-03-12 DIAGNOSIS — I2119 ST elevation (STEMI) myocardial infarction involving other coronary artery of inferior wall: Secondary | ICD-10-CM

## 2017-03-12 DIAGNOSIS — I5030 Unspecified diastolic (congestive) heart failure: Secondary | ICD-10-CM

## 2017-03-12 DIAGNOSIS — I5033 Acute on chronic diastolic (congestive) heart failure: Secondary | ICD-10-CM

## 2017-03-12 DIAGNOSIS — E785 Hyperlipidemia, unspecified: Secondary | ICD-10-CM

## 2017-03-12 DIAGNOSIS — J449 Chronic obstructive pulmonary disease, unspecified: Secondary | ICD-10-CM

## 2017-03-12 DIAGNOSIS — I5023 Acute on chronic systolic (congestive) heart failure: Secondary | ICD-10-CM | POA: Diagnosis present

## 2017-03-12 LAB — LIPID PANEL
CHOLESTEROL: 237 mg/dL — AB (ref 0–200)
HDL: 43 mg/dL (ref >40)
LDL Cholesterol: 151 mg/dL — ABNORMAL HIGH (ref 0–99)
Total CHOL/HDL Ratio: 5.5 RATIO
Triglycerides: 217 mg/dL — ABNORMAL HIGH (ref <150)
VLDL: 43 mg/dL — ABNORMAL HIGH (ref 0–40)

## 2017-03-12 LAB — ECHOCARDIOGRAM COMPLETE
AOASC: 45 cm
CHL CUP MV DEC (S): 197
CHL CUP STROKE VOLUME: 27 mL
E decel time: 197 msec
EERAT: 7.26
FS: 17 % — AB (ref 28–44)
HEIGHTINCHES: 71 in
IV/PV OW: 1.21
LA diam index: 1.63 cm/m2
LA vol A4C: 45.8 ml
LASIZE: 35 mm
LAVOL: 45.8 mL
LAVOLIN: 21.3 mL/m2
LDCA: 3.8 cm2
LEFT ATRIUM END SYS DIAM: 35 mm
LV PW d: 14 mm — AB (ref 0.6–1.1)
LV dias vol: 73 mL (ref 62–150)
LV sys vol index: 22 mL/m2
LV sys vol: 46 mL (ref 21–61)
LVDIAVOLIN: 34 mL/m2
LVEEAVG: 7.26
LVEEMED: 7.26
LVELAT: 7.8 cm/s
LVOT VTI: 17.4 cm
LVOT diameter: 22 mm
LVOT peak grad rest: 3 mmHg
LVOT peak vel: 92 cm/s
LVOTSV: 66 mL
MV pk A vel: 77.1 m/s
MVPKEVEL: 56.6 m/s
P 1/2 time: 58 ms
Simpson's disk: 37
TAPSE: 11.2 mm
TDI e' lateral: 7.8
TDI e' medial: 8.61
WEIGHTICAEL: 3361.57 [oz_av]

## 2017-03-12 LAB — BASIC METABOLIC PANEL
Anion gap: 11 (ref 5–15)
BUN: 13 mg/dL (ref 6–20)
CALCIUM: 8.4 mg/dL — AB (ref 8.9–10.3)
CHLORIDE: 102 mmol/L (ref 101–111)
CO2: 22 mmol/L (ref 22–32)
CREATININE: 0.92 mg/dL (ref 0.61–1.24)
GFR calc Af Amer: >60 mL/min (ref >60)
Glucose, Bld: 109 mg/dL — ABNORMAL HIGH (ref 65–99)
Potassium: 3.7 mmol/L (ref 3.5–5.1)
SODIUM: 135 mmol/L (ref 135–145)

## 2017-03-12 LAB — CBC
HCT: 47.3 % (ref 39.0–52.0)
Hemoglobin: 15.8 g/dL (ref 13.0–17.0)
MCH: 31.3 pg (ref 26.0–34.0)
MCHC: 33.4 g/dL (ref 30.0–36.0)
MCV: 93.8 fL (ref 78.0–100.0)
PLATELETS: 241 10*3/uL (ref 150–400)
RBC: 5.04 MIL/uL (ref 4.22–5.81)
RDW: 14.1 % (ref 11.5–15.5)
WBC: 10.7 10*3/uL — AB (ref 4.0–10.5)

## 2017-03-12 LAB — POCT ACTIVATED CLOTTING TIME
Activated Clotting Time: 417 seconds
Activated Clotting Time: 164 seconds

## 2017-03-12 LAB — TROPONIN I
TROPONIN I: 25.89 ng/mL — AB (ref <0.03)
TROPONIN I: 40.89 ng/mL — AB (ref <0.03)
Troponin I: 19.88 ng/mL (ref <0.03)

## 2017-03-12 LAB — HIV ANTIBODY (ROUTINE TESTING W REFLEX): HIV Screen 4th Generation wRfx: NONREACTIVE

## 2017-03-12 LAB — MRSA PCR SCREENING: MRSA by PCR: NEGATIVE

## 2017-03-12 MED ORDER — ALBUTEROL SULFATE (2.5 MG/3ML) 0.083% IN NEBU
2.5000 mg | INHALATION_SOLUTION | Freq: Four times a day (QID) | RESPIRATORY_TRACT | Status: DC | PRN
  Administered 2017-03-14: 2.5 mg via RESPIRATORY_TRACT
  Filled 2017-03-12: qty 3

## 2017-03-12 MED ORDER — MOMETASONE FURO-FORMOTEROL FUM 200-5 MCG/ACT IN AERO
2.0000 | INHALATION_SPRAY | Freq: Two times a day (BID) | RESPIRATORY_TRACT | Status: DC
Start: 2017-03-12 — End: 2017-03-14
  Administered 2017-03-12 – 2017-03-14 (×4): 2 via RESPIRATORY_TRACT
  Filled 2017-03-12 (×2): qty 8.8

## 2017-03-12 MED ORDER — FUROSEMIDE 10 MG/ML IJ SOLN
40.0000 mg | Freq: Once | INTRAMUSCULAR | Status: AC
  Administered 2017-03-12: 40 mg via INTRAVENOUS
  Filled 2017-03-12: qty 4

## 2017-03-12 MED ORDER — PERFLUTREN LIPID MICROSPHERE
INTRAVENOUS | Status: AC
  Administered 2017-03-12: 2 mL
  Filled 2017-03-12: qty 10

## 2017-03-12 MED ORDER — PERFLUTREN LIPID MICROSPHERE
1.0000 mL | INTRAVENOUS | Status: AC | PRN
  Administered 2017-03-12: 2 mL via INTRAVENOUS
  Filled 2017-03-12: qty 10

## 2017-03-12 MED FILL — Heparin Sodium (Porcine) Inj 1000 Unit/ML: INTRAMUSCULAR | Qty: 10 | Status: AC

## 2017-03-12 MED FILL — Heparin Sodium (Porcine) 2 Unit/ML in Sodium Chloride 0.9%: INTRAMUSCULAR | Qty: 500 | Status: AC

## 2017-03-12 NOTE — Progress Notes (Signed)
  Echocardiogram 2D Echocardiogram has been performed.  Janalyn HarderWest, Zohaib Heeney R 03/12/2017, 9:52 AM

## 2017-03-12 NOTE — Progress Notes (Signed)
Progress Note  Patient Name: Kevin CaperRoger Lynde Date of Encounter: 03/12/2017  Primary Cardiologist: No primary care provider on file.   Patient Profile     61 y.o. male with history of CAD s/p CABG (01/2013, LIMA to LAD, SVG to OM, SVG to PDA), subsequent STEMI in 04/2013 with SVG to OM lesion as the culprit, ultimately fixed the native LCx despite attempts to stent the vein grafts. He presented overnight 12/30-31 with inferior STEMI found to have a acute occlusion of the SVG-RCA with extensive thrombus and downstream embolization.  This was treated with related thrombectomy followed by PTCA and DES PCI. He also has a history of 4.7 mm Thoracic Ascending Aortic Aneurysm  Subjective   No further chest pain/angina -he notes that he just feels "weird " Mostly notes some tightness with associated breathing and wheezing rated his COPD.  Apparently he is on Symbicort and inhalers at home.  These are being ordered No nosebleeds or coughing.  Notable wheezing.  Inpatient Medications    Scheduled Meds: . amitriptyline  10 mg Oral Daily  . amLODipine  10 mg Oral Daily  . aspirin EC  81 mg Oral Daily  . atorvastatin  80 mg Oral Daily  . escitalopram  20 mg Oral Daily  . metoprolol succinate  25 mg Oral Daily  . mometasone-formoterol  2 puff Inhalation BID  . pantoprazole  40 mg Oral Daily  . rOPINIRole  1 mg Oral Daily  . sodium chloride flush  3 mL Intravenous Q12H  . ticagrelor  90 mg Oral Q12H   Continuous Infusions: . sodium chloride     PRN Meds: sodium chloride, acetaminophen, albuterol, morphine injection, nitroGLYCERIN, ondansetron (ZOFRAN) IV, oxyCODONE, sodium chloride flush   Vital Signs    Vitals:   03/12/17 0400 03/12/17 0500 03/12/17 0600 03/12/17 0700  BP: (!) 133/100 (!) 132/97 (!) 127/95 (!) 127/97  Pulse: 77 72 76 73  Resp: 13 13 11 12   Temp:      TempSrc:      SpO2: 95% 95% 93% 94%  Weight:      Height:        Intake/Output Summary (Last 24 hours) at  03/12/2017 0845 Last data filed at 03/12/2017 0700 Gross per 24 hour  Intake 1305.35 ml  Output -  Net 1305.35 ml   Filed Weights   03/11/17 2130  Weight: 210 lb 1.6 oz (95.3 kg)    Telemetry    Sinus rhythm in the 70s.  Inferior infarct, age undetermined- Personally Reviewed  ECG    Sinus rhythm with RBBB- Personally Reviewed  Physical Exam   Physical Exam  Constitutional: He is oriented to person, place, and time. He appears well-developed and well-nourished. No distress.  HENT:  Head: Normocephalic and atraumatic.  Eyes: EOM are normal.  Neck: Neck supple. JVD (Mildly elevated) present.  Posterior cervical scar noted  Cardiovascular: Normal rate, regular rhythm and normal pulses.  No extrasystoles are present. PMI is not displaced. Exam reveals gallop and S4 (Cannot exclude soft). Exam reveals no friction rub.  No murmur heard. Pulmonary/Chest: No respiratory distress. He has wheezes. He has no rales.  Mild baseline neck accessory muscle use for breathing, but no distress. Diffuse coarse rhonchi and rales.  Abdominal: Soft. Bowel sounds are normal. He exhibits no distension. There is no tenderness. There is no rebound and no guarding.  Musculoskeletal: Normal range of motion. He exhibits no edema.  Neurological: He is alert and oriented to person, place, and time.  Skin: Skin is warm and dry. No rash noted. No erythema.  Psychiatric: He has a normal mood and affect. His behavior is normal. Judgment and thought content normal.  Nursing note and vitals reviewed.   Labs    Chemistry Recent Labs  Lab 03/11/17 1853 03/11/17 1857 03/12/17 0316  NA 138 140 135  K 4.1 4.2 3.7  CL 102 104 102  CO2 23  --  22  GLUCOSE 109* 110* 109*  BUN 16 19 13   CREATININE 1.21 1.20 0.92  CALCIUM 9.1  --  8.4*  GFRNONAA >60  --  >60  GFRAA >60  --  >60  ANIONGAP 13  --  11     Hematology Recent Labs  Lab 03/11/17 1853 03/11/17 1857 03/12/17 0316  WBC 14.0*  --  10.7*    RBC 5.37  --  5.04  HGB 17.1* 18.0* 15.8  HCT 50.3 53.0* 47.3  MCV 93.7  --  93.8  MCH 31.8  --  31.3  MCHC 34.0  --  33.4  RDW 14.0  --  14.1  PLT 271  --  241    Cardiac Enzymes Recent Labs  Lab 03/11/17 1853 03/12/17 0100 03/12/17 0316  TROPONINI 0.04* 19.88* 25.89*    Recent Labs  Lab 03/11/17 1855  TROPIPOC 0.01     BNP Recent Labs  Lab 03/11/17 2230  BNP 43.8     DDimer No results for input(s): DDIMER in the last 168 hours.   Radiology    No results found.  Cardiac Studies    Cardiac Cath/PCI 03/11/2017: 1. Severe native vessel CAD with total occlusion of the RCA and LAD 2. Patent native left circumflex and left main 3. S/P CABG with patency of the LIMA-LAD and chronic total occlusion of the SVG-OM 4. Acute total occlusion of the SVG-PDA--> PTCA, angiojet thrombectomy, and DES PCI (Synergy Des 3.5x16) 5. Moderate segmental LV systolic dysfunction with elevated LVEDP (25 mmHg = acute diastolic heart failure) Recommend: DAPT with ASA and ticagrelor at least 12 months, tobacco cessation, medication adherence (pt does not take ASA any longer).  Anticipate DC 48 hours if no complications arise. Check echo tomorrow for better assessment of LV function. Post-Intervention Diagram     2D echocardiogram pending  Assessment & Plan    Principal Problem:   Acute ST elevation myocardial infarction (STEMI) of inferior wall (HCC) Active Problems:   S/P CABG x 3   Acute on chronic diastolic heart failure (HCC)   Coronary artery disease involving 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 (HCC)   Essential hypertension, benign   COPD with chronic bronchitis and emphysema (HCC)   Hyperlipidemia with target LDL less than 70  Principal Problem:   Acute ST elevation myocardial infarction (STEMI) of inferior wall (HCC) / S/P CABG x 3 /Coronary artery disease involving 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 (HCC) --despite  extensive thrombus in distal embolization, troponin only peaked at roughly 26.  Successful PCI of culprit lesion in the SVG-RCA.  (Personally reviewed films with Dr. Excell Seltzerooper) extensive thrombus noted throughout the graft treated with related thrombectomy.  There was diffuse distal embolization, however there appeared to be dense inferior hypokinesis likely from a prior infarct.  Currently on aspirin and Brilinta (had issues with Brilinta cost in the past) will need to ensure that he will be taking dual antiplatelet therapy for minimum 1 year.  High-dose statin, beta-blocker ordered  2D echo ordered to better assess EF/wall  motion and filling pressures      Acute on chronic diastolic heart failure (HCC): Mild dyspnea and rales on exam.  Most likely related to COPD  Will give 1 dose IV Lasix today  Currently on Toprol and amlodipine for afterload reduction.    Was not an ARB prior to arrival, will hold for now until we see how her renal function and blood pressure responxds to Lasix.  Echo pending      Essential hypertension, benign  Blood pressure well controlled currently on amlodipine and Toprol.  Would consider ARB if blood pressure allows    COPD with chronic bronchitis and emphysema (HCC) -wheezing on exam.  ordered nebulizers and Dulera (as replacement for Symbicort)  We will check chest x-ray    Hyperlipidemia with target LDL less than 70 -  high-dose atorvastatin  He is on Elavil and Lexapro for anxiety/depression.  Continue  He has a history of medical nonadherence mostly because of financial issues.  May have some concerns with Brilinta.  Will ask care management to help with assessment.  May need to convert back to Plavix.  Relatively stable currently, but still recovering from pneumonia.  We would like to monitor in the ICU setting for now.  If stable would be okay to transfer out later on this afternoon versus this evening.  Per Dr. Excell Seltzer, if remains stable  anticipate possible discharge tomorrow versus the next day --likely depending on his dyspnea and echo.   Time Spent Directly with Patient:  I have spent a total of  30 minutes with the patient reviewing hospitalnotes, telemetry, EKGs, labs and examining the patient as well as establishing an assessment and plan that was discussed personally with the patient.>50% of time was spent in direct patient care.   For questions or updates, please contact [facility] Please consult [email or web address] for contact info under Cardiology/STEMI.      Signed, Bryan Lemma, MD  03/12/2017, 8:45 AM

## 2017-03-12 NOTE — Progress Notes (Signed)
CARDIAC REHAB PHASE I   PRE:  Rate/Rhythm: 78 SR  BP:  Supine: 123/86  Sitting:   Standing:    SaO2: 95% 2L  MODE:  Ambulation: 290 ft   POST:  Rate/Rhythm: 100 SR  BP:  Supine:   Sitting: 122/97  Standing:    SaO2: 95%RA  95 2L 1020-1132 Pt walked 290 ft on RA with little assistance. Did well until he had to cough severely and stated he felt a little dizzy. To recliner after walk and sats good on RA. Put back on oxygen since pt appeared SOB and for comfort. No CP. MI education done and I noticed pt grimacing and rubbing chest. Stated he thought the coughing made his chest hurt. 4 on scale of 1 to 10. Asked to go back to bed. Called for RN and assisted pt back to bed. RN in to see pt.  Reviewed MI restrictions, stent/brilinta, heart healthy food choices, ex ed and NTG use. Needs to see case manager re brilinta.  Discussed smoking cessation and gave pt fake cigarette and smoking cessation handout. Discussed CRP 2 and will refer to [place]. Pt will not be able to attend CRP 2 due to not driving.  Pt drinks about 5 can of regular soda a day and puts 10 teaspoons of sugar in each cup of coffee he has. Discussed cutting back on sugar and drinking more water.   Luetta Nuttingharlene Chaniyah Jahr, RN BSN  03/12/2017 11:28 AM

## 2017-03-12 NOTE — Care Management Note (Signed)
Case Management Note Donn PieriniKristi Shataya Winkles RN, BSN Unit 4E-Case Manager-- 2H coverage [phone]   Patient Details  Name: Katharina CaperRoger Fera MRN: [id number] Date of Birth: 10/27/1955  Subjective/Objective:    Pt admitted with STEMI, s/p DES- started on Brilinta              Action/Plan: PTA pt lived at home- referral for Brilinta needs- spoke with pt at bedside to discuss Brilinta- per pt he will have both Medicare/Medicaid coverage starting tomorrow Jan. 1- Brilinta is covered under Medicaid- copay $3.70- pt uses [facility] in [place]- pt provided 30 day free card to use on discharge with his medicaid -   Expected Discharge Date:                  Expected Discharge Plan:  Home/Self Care  In-House Referral:     Discharge planning Services  CM Consult, Medication Assistance  Post Acute Care Choice:    Choice offered to:     DME Arranged:    DME Agency:     HH Arranged:    HH Agency:     Status of Service:  In process, will continue to follow  If discussed at Long Length of Stay Meetings, dates discussed:    Discharge Disposition:   Additional Comments:  Darrold SpanWebster, Kayleena Eke Hall, RN 03/12/2017, 1:39 PM

## 2017-03-13 ENCOUNTER — Inpatient Hospital Stay (HOSPITAL_COMMUNITY): Payer: Medicaid Other

## 2017-03-13 ENCOUNTER — Other Ambulatory Visit: Payer: Self-pay

## 2017-03-13 ENCOUNTER — Encounter (HOSPITAL_COMMUNITY): Payer: Self-pay

## 2017-03-13 DIAGNOSIS — I5043 Acute on chronic combined systolic (congestive) and diastolic (congestive) heart failure: Secondary | ICD-10-CM

## 2017-03-13 MED ORDER — CLOPIDOGREL BISULFATE 75 MG PO TABS
300.0000 mg | ORAL_TABLET | Freq: Once | ORAL | Status: AC
  Administered 2017-03-13: 300 mg via ORAL
  Filled 2017-03-13: qty 4

## 2017-03-13 MED ORDER — CLOPIDOGREL BISULFATE 75 MG PO TABS: 75.0000 mg | ORAL_TABLET | Freq: Every day | ORAL | Status: DC

## 2017-03-13 MED ORDER — LOSARTAN POTASSIUM 25 MG PO TABS
25.0000 mg | ORAL_TABLET | Freq: Every day | ORAL | Status: DC
  Administered 2017-03-14: 25 mg via ORAL
  Filled 2017-03-13: qty 1

## 2017-03-13 NOTE — Progress Notes (Signed)
Progress Note  Patient Name: Kevin CaperRoger Iwanicki Date of Encounter: 03/13/2017  Primary Cardiologist: No primary care provider on file.   Subjective   The patient is complaining of shortness of breath this morning.  States it is difficult to take a deep breath.  No chest pain.  Inpatient Medications    Scheduled Meds: . amitriptyline  10 mg Oral Daily  . amLODipine  10 mg Oral Daily  . aspirin EC  81 mg Oral Daily  . atorvastatin  80 mg Oral Daily  . escitalopram  20 mg Oral Daily  . metoprolol succinate  25 mg Oral Daily  . mometasone-formoterol  2 puff Inhalation BID  . pantoprazole  40 mg Oral Daily  . rOPINIRole  1 mg Oral Daily  . sodium chloride flush  3 mL Intravenous Q12H  . ticagrelor  90 mg Oral Q12H   Continuous Infusions: . sodium chloride     PRN Meds: sodium chloride, acetaminophen, albuterol, morphine injection, nitroGLYCERIN, ondansetron (ZOFRAN) IV, oxyCODONE, sodium chloride flush   Vital Signs    Vitals:   03/13/17 0820 03/13/17 0900 03/13/17 0915 03/13/17 0933  BP: 109/84 98/79 105/89   Pulse: 77 76 78   Resp: 16 (!) 21 15   Temp: 98.3 F (36.8 C)     TempSrc: Oral     SpO2: 96% 95% 94% 93%  Weight:      Height:        Intake/Output Summary (Last 24 hours) at 03/13/2017 [id number] Last data filed at 03/13/2017 0600 Gross per 24 hour  Intake 300 ml  Output 1840 ml  Net -1540 ml   Filed Weights   03/11/17 2130  Weight: 210 lb 1.6 oz (95.3 kg)    Telemetry    Sinus rhythm with occasional PVCs, no sustained arrhythmia- Personally Reviewed   Physical Exam  Alert, oriented male in no distress GEN: No acute distress.   Neck: No JVD Cardiac: RRR, no murmurs, rubs, or gallops.  Respiratory: Clear to auscultation bilaterally. GI: Soft, nontender, non-distended  MS: No edema; No deformity.  Right groin site is clear with mild tenderness but no hematoma or ecchymosis Neuro:  Nonfocal  Psych: Normal affect   Labs    Chemistry Recent Labs  Lab  03/11/17 1853 03/11/17 1857 03/12/17 0316  NA 138 140 135  K 4.1 4.2 3.7  CL 102 104 102  CO2 23  --  22  GLUCOSE 109* 110* 109*  BUN 16 19 13   CREATININE 1.21 1.20 0.92  CALCIUM 9.1  --  8.4*  GFRNONAA >60  --  >60  GFRAA >60  --  >60  ANIONGAP 13  --  11     Hematology Recent Labs  Lab 03/11/17 1853 03/11/17 1857 03/12/17 0316  WBC 14.0*  --  10.7*  RBC 5.37  --  5.04  HGB 17.1* 18.0* 15.8  HCT 50.3 53.0* 47.3  MCV 93.7  --  93.8  MCH 31.8  --  31.3  MCHC 34.0  --  33.4  RDW 14.0  --  14.1  PLT 271  --  241    Cardiac Enzymes Recent Labs  Lab 03/11/17 1853 03/12/17 0100 03/12/17 0316 03/12/17 1004  TROPONINI 0.04* 19.88* 25.89* 40.89*    Recent Labs  Lab 03/11/17 1855  TROPIPOC 0.01     BNP Recent Labs  Lab 03/11/17 2230  BNP 43.8     DDimer No results for input(s): DDIMER in the last 168 hours.   Radiology  Dg Chest Port 1 View  Result Date: 03/12/2017 CLINICAL DATA:  Shortness of Breath EXAM: PORTABLE CHEST 1 VIEW COMPARISON:  November 25, 2015 and December 07, 2015 FINDINGS: There is a small area of consolidation in the left base. Lungs elsewhere are clear. Heart is borderline enlarged with pulmonary vascularity within normal limits. No adenopathy. Patient is status post coronary artery bypass grafting. There is aortic atherosclerosis. No evident bone lesions. IMPRESSION: Smaller left base consolidation felt to represent focal pneumonia. Lungs elsewhere clear. Heart borderline enlarged. There is aortic atherosclerosis. Status post coronary artery bypass grafting. Aortic Atherosclerosis (ICD10-I70.0). Followup PA and lateral chest radiographs recommended in 3-4 weeks following trial of antibiotic therapy to ensure resolution and exclude underlying malignancy. Electronically Signed   By: Bretta Bang III M.D.   On: 03/12/2017 10:28    Cardiac Studies   2D Echo: Study Conclusions  - Left ventricle: The cavity size was normal. There  was severe   concentric hypertrophy. Systolic function was mildly to   moderately reduced. The estimated ejection fraction was in the   range of 40% to 45%. Doppler parameters are consistent with   abnormal left ventricular relaxation (grade 1 diastolic   dysfunction). Doppler parameters are consistent with high   ventricular filling pressure. - Aortic valve: There was trivial regurgitation. - Aortic root: The aortic root was mildly dilated measuring 41 mm. - Ascending aorta: The ascending aorta was moderately dilated   measuring 45 mm. - Mitral valve: There was trivial regurgitation. - Left atrium: The atrium was normal in size. - Right ventricle: Systolic function was normal. - Right atrium: The atrium was normal in size. - Tricuspid valve: There was trivial regurgitation. - Pulmonary arteries: Systolic pressure was within the normal   range. - Inferior vena cava: The vessel was normal in size. - Pericardium, extracardiac: There was no pericardial effusion.  Impressions:  - Akinesis of the mid and basal inferior and inferolateral walls.   LVEF 40-45%. Dilated aortic root and ascending aorta measuring 45   mm. No prior echocardiogram is available for comparison.   Patient Profile     62 y.o. male with extensive CAD status post remote CABG presents with an acute inferior MI secondary to saphenous vein graft occlusion  Assessment & Plan    1.  Acute STEMI of the inferior wall: The patient underwent PCI with AngioJet thrombectomy and stenting of the saphenous vein graft to PDA.  Currently on aspirin and ticagrelor for antiplatelet therapy.  The patient's description of shortness of breath is somewhat typical for ticagrelor-related side effects.  I am going to switch him to clopidogrel today.  Will add a low-dose ARB in the setting of his reduced LVEF.  Continue a high intensity statin and beta-blocker.  2.  Acute on chronic combined systolic and diastolic heart failure: We will  continue medical therapy.  Hold on diuretics as he does not appear acutely volume overloaded.  Add ARB as outlined.  Repeat PA and lateral chest x-ray.  3.  COPD: Apparently has been advised to be on home O2 at times but does not do this.  Appears stable without evidence of acute exacerbation.  4.  Hyperlipidemia: Continue high intensity statin drug.  5.  Tobacco abuse: Cessation counseling done today.  He smokes about a pack per day and rolls his own cigarettes.   6.  Medication nonadherence: Counseled today regarding the importance of medication adherence in the setting of his recent infarct, saphenous vein graft occlusion, and extensive  CAD.  Disposition: Today will check a chest x-ray, change ticagrelor to clopidogrel.  Will transfer out of the cardiovascular ICU to a telemetry bed.  Possible discharge tomorrow.  For questions or updates, please contact [facility] Please consult [email or web address] for contact info under Cardiology/STEMI.      Signed, Tonny Bollman, MD  03/13/2017, 9:38 AM

## 2017-03-13 NOTE — Progress Notes (Signed)
Pt transferred from [facility]. VSS. Telemetry applied. CHG bath complete. Pt oriented to room. Dinner tray ordered. Will continue to monitor.  Versie StarksHanna  Rhianon Zabawa, RN

## 2017-03-14 ENCOUNTER — Encounter (HOSPITAL_COMMUNITY): Payer: Self-pay | Admitting: Cardiology

## 2017-03-14 DIAGNOSIS — Z955 Presence of coronary angioplasty implant and graft: Secondary | ICD-10-CM

## 2017-03-14 DIAGNOSIS — I5023 Acute on chronic systolic (congestive) heart failure: Secondary | ICD-10-CM

## 2017-03-14 MED ORDER — PANTOPRAZOLE SODIUM 40 MG PO TBEC: 40.0000 mg | DELAYED_RELEASE_TABLET | Freq: Every day | ORAL | 0 refills | Status: DC

## 2017-03-14 MED ORDER — ATORVASTATIN CALCIUM 80 MG PO TABS: 80.0000 mg | ORAL_TABLET | Freq: Every day | ORAL | 0 refills | Status: DC

## 2017-03-14 MED ORDER — ASPIRIN 81 MG PO TBEC: 81.0000 mg | DELAYED_RELEASE_TABLET | Freq: Every day | ORAL | Status: DC

## 2017-03-14 MED ORDER — NITROGLYCERIN 0.4 MG SL SUBL: 0.4000 mg | SUBLINGUAL_TABLET | SUBLINGUAL | 0 refills | Status: DC | PRN

## 2017-03-14 MED ORDER — LOSARTAN POTASSIUM 25 MG PO TABS: 25.0000 mg | ORAL_TABLET | Freq: Every day | ORAL | 0 refills | Status: DC

## 2017-03-14 MED ORDER — METOPROLOL SUCCINATE ER 25 MG PO TB24: 25.0000 mg | ORAL_TABLET | Freq: Every day | ORAL | 0 refills | Status: DC

## 2017-03-14 MED ORDER — CLOPIDOGREL BISULFATE 75 MG PO TABS: 75.0000 mg | ORAL_TABLET | Freq: Every day | ORAL | 0 refills | Status: DC

## 2017-03-14 NOTE — Progress Notes (Addendum)
NCM checked with financial counseling, patient does not have medicaid yet.  MD has switched patient to plavix. NCM informed patient of this information, patient has hospital follow up with patient care center on Jan 17th at 1pm , he can also utilize the [facility] for medication ast.  NCM gave him brouchures.  [facility] closes at 5:30 pm.

## 2017-03-14 NOTE — Discharge Summary (Signed)
Discharge Summary    Patient ID: Kevin Smith,  MRN: [id number], DOB/AGE: 1955-08-23 62 y.o.  Admit date: 03/11/2017 Discharge date: 03/14/2017  Primary Care Provider: Domenick Bookbinder Primary Cardiologist: Roosvelt Maser)  Discharge Diagnoses    Principal Problem:   Acute ST elevation myocardial infarction (STEMI) of inferior wall (HCC) Active Problems:   Essential hypertension, benign   COPD with chronic bronchitis and emphysema (HCC)   Hyperlipidemia with target LDL less than 70   S/P CABG x 3   Acute on chronic systolic heart failure (HCC)   Coronary artery disease involving 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 (HCC)   Allergies No Known Allergies  Diagnostic Studies/Procedures    Cath: 03/11/17  Conclusion   1. Severe native vessel CAD with total occlusion of the RCA and LAD 2. Patent native left circumflex and left main 3. S/P CABG with patency of the LIMA-LAD and chronic total occlusion of the SVG-OM 4. Acute total occlusion of the SVG-PDA, treated successfully with PTCA, angiojet thrombectomy, and stenting 5. Moderate segmental LV systolic dysfunction with elevated LVEDP (acute diastolic heart failure)  Recommend: DAPT with ASA and ticagrelor at least 12 months, tobacco cessation, medication adherence (pt does not take ASA any longer).  Anticipate DC 48 hours if no complications arise. Check echo tomorrow for better assessment of LV function.   TTE: 03/12/17  Study Conclusions  - Left ventricle: The cavity size was normal. There was severe   concentric hypertrophy. Systolic function was mildly to   moderately reduced. The estimated ejection fraction was in the   range of 40% to 45%. Doppler parameters are consistent with   abnormal left ventricular relaxation (grade 1 diastolic   dysfunction). Doppler parameters are consistent with high   ventricular filling pressure. - Aortic valve: There was trivial regurgitation. - Aortic  root: The aortic root was mildly dilated measuring 41 mm. - Ascending aorta: The ascending aorta was moderately dilated   measuring 45 mm. - Mitral valve: There was trivial regurgitation. - Left atrium: The atrium was normal in size. - Right ventricle: Systolic function was normal. - Right atrium: The atrium was normal in size. - Tricuspid valve: There was trivial regurgitation. - Pulmonary arteries: Systolic pressure was within the normal   range. - Inferior vena cava: The vessel was normal in size. - Pericardium, extracardiac: There was no pericardial effusion.  Impressions:  - Akinesis of the mid and basal inferior and inferolateral walls.   LVEF 40-45%. Dilated aortic root and ascending aorta measuring 45   mm. No prior echocardiogram is available for comparison. _____________   History of Present Illness     62 y.o. male with history of CAD s/p CABG (01/2015, LIMA to LAD, SVG to OM, SVG to PDA), subsequent STEMI in 04/2013 with SVG to OM lesion as the culprit, ultimately fixed the native LCx despite attempts to stent the vein grafts. The patient also has a history of 4.7 cm thoracic ascending aortic aneurysm. He presented the evening of admission with acute chest pain and was found to have inferior ST elevations on ECG. He had no neurologic symptoms and symmetric pulses bilaterally.  He was given full dose ASA and 4000 units IV heparin between EMS and the ED. Transferred to the Cath lab for emergent cardiac cath.   Hospital Course     Underwent cardiac cath noted above with severe native disease in the RCA and LAD with patent LCx stent. Patent LIMA--LAD, with CTO  of SVG--OM, and acute occlusion of SVG-PDA with successful PTCA, thrombectomy and DESx1 placed. Planned for DAPT with ASA/Brilinta for at least 12 months, but patient developed significant dyspnea with Brilinta. Therefore was loaded with plavix and switched to 75mg  daily. Troponin peaked at 40.89. LDL 151. LV gram showed  decreased EF of 35-45%. Follow up echo showed EF of 40-45% with akinesis in the mid/basal inferior and inferolateral walls. He was continued on BB, and high dose statin. Added low dose ARB given his reduced EF. Worked well with cardiac rehab without recurrent chest pain. Tobacco cessation discussed with the patient. Seen by the CM to set up for PCP prior to discharge. Plans to use [facility] for Rx, and given paper copies at discharge.   General: Well developed, well nourished, male appearing in no acute distress. Head: Normocephalic, atraumatic.  Neck: Supple without bruits, JVD. Lungs:  Resp regular and unlabored, CTA. Heart: RRR, S1, S2, no S3, S4, or murmur; no rub. Abdomen: Soft, non-tender, non-distended with normoactive bowel sounds. No hepatomegaly. No rebound/guarding. No obvious abdominal masses. Extremities: No clubbing, cyanosis, edema. Distal pedal pulses are 2+ bilaterally.  Neuro: Alert and oriented X 3. Moves all extremities spontaneously. Psych: Normal affect.  Tadeo Besecker was seen by Dr. Herbie Baltimore and determined stable for discharge home. Follow up in the office has been arranged. Medications are listed below.   _____________  Discharge Vitals Blood pressure 120/79, pulse 86, temperature 97.9 F (36.6 C), temperature source Oral, resp. rate 18, height 5\' 11"  (1.803 m), weight 210 lb 1.6 oz (95.3 kg), SpO2 94 %.  Filed Weights   03/11/17 2130  Weight: 210 lb 1.6 oz (95.3 kg)    Labs & Radiologic Studies    CBC Recent Labs    03/11/17 1853 03/11/17 1857 03/12/17 0316  WBC 14.0*  --  10.7*  NEUTROABS 9.6*  --   --   HGB 17.1* 18.0* 15.8  HCT 50.3 53.0* 47.3  MCV 93.7  --  93.8  PLT 271  --  241   Basic Metabolic Panel Recent Labs    54/09/81 1853 03/11/17 1857 03/12/17 0316  NA 138 140 135  K 4.1 4.2 3.7  CL 102 104 102  CO2 23  --  22  GLUCOSE 109* 110* 109*  BUN 16 19 13   CREATININE 1.21 1.20 0.92  CALCIUM 9.1  --  8.4*   Liver Function Tests No  results for input(s): AST, ALT, ALKPHOS, BILITOT, PROT, ALBUMIN in the last 72 hours. No results for input(s): LIPASE, AMYLASE in the last 72 hours. Cardiac Enzymes Recent Labs    03/12/17 0100 03/12/17 0316 03/12/17 1004  TROPONINI 19.88* 25.89* 40.89*   BNP Invalid input(s): POCBNP D-Dimer No results for input(s): DDIMER in the last 72 hours. Hemoglobin A1C Recent Labs    03/11/17 2230  HGBA1C 5.3   Fasting Lipid Panel Recent Labs    03/12/17 0316  CHOL 237*  HDL 43  LDLCALC 151*  TRIG 217*  CHOLHDL 5.5   Thyroid Function Tests No results for input(s): TSH, T4TOTAL, T3FREE, THYROIDAB in the last 72 hours.  Invalid input(s): FREET3 _____________  Dg Chest 2 View  Result Date: 03/13/2017 CLINICAL DATA:  Chest pain EXAM: CHEST  2 VIEW COMPARISON:  03/12/2017 FINDINGS: Lungs are hyperexpanded. Interstitial markings are diffusely coarsened with chronic features. Stable atelectasis or infiltrate at the left base. Cardiopericardial silhouette is at upper limits of normal for size. Patient is status post CABG. Telemetry leads overlie the chest. Old  right rib fractures again noted. IMPRESSION: Stable exam. Electronically Signed   By: Kennith Center M.D.   On: 03/13/2017 12:07   Dg Chest Port 1 View  Result Date: 03/12/2017 CLINICAL DATA:  Shortness of Breath EXAM: PORTABLE CHEST 1 VIEW COMPARISON:  November 25, 2015 and December 07, 2015 FINDINGS: There is a small area of consolidation in the left base. Lungs elsewhere are clear. Heart is borderline enlarged with pulmonary vascularity within normal limits. No adenopathy. Patient is status post coronary artery bypass grafting. There is aortic atherosclerosis. No evident bone lesions. IMPRESSION: Smaller left base consolidation felt to represent focal pneumonia. Lungs elsewhere clear. Heart borderline enlarged. There is aortic atherosclerosis. Status post coronary artery bypass grafting. Aortic Atherosclerosis (ICD10-I70.0). Followup  PA and lateral chest radiographs recommended in 3-4 weeks following trial of antibiotic therapy to ensure resolution and exclude underlying malignancy. Electronically Signed   By: Bretta Bang III M.D.   On: 03/12/2017 10:28   Disposition   Pt is being discharged home today in good condition.  Follow-up Plans & Appointments    Follow-up Information    [facility] Follow up on 03/29/2017.   Why:  1 pm for hospital follow up Contact information: [address] [id number] [place] [address] [phone]       [facility] Follow up.   Why:  you can utilize this clinic pharmacy for medication ast. Contact information: [address] [facility] [address] [phone]       Dyann Kief, PA-C Follow up on 03/28/2017.   Specialty:  Cardiology Why:  at 11am for your follow up appt.  Contact information: [address] [place] [address] [phone]          Discharge Instructions    Amb Referral to Cardiac Rehabilitation   Complete by:  As directed    Diagnosis:   STEMI Coronary Stents     Call MD for:  redness, tenderness, or signs of infection (pain, swelling, redness, odor or green/yellow discharge around incision site)   Complete by:  As directed    Diet - low sodium heart healthy   Complete by:  As directed    Discharge instructions   Complete by:  As directed    Groin Site Care Refer to this sheet in the next few weeks. These instructions provide you with information on caring for yourself after your procedure. Your caregiver may also give you more specific instructions. Your treatment has been planned according to current medical practices, but problems sometimes occur. Call your caregiver if you have any problems or questions after your procedure. HOME CARE INSTRUCTIONS You may shower 24 hours after the procedure. Remove the bandage (dressing) and gently wash the  site with plain soap and water. Gently pat the site dry.  Do not apply powder or lotion to the site.  Do not sit in a bathtub, swimming pool, or whirlpool for 5 to 7 days.  No bending, squatting, or lifting anything over 10 pounds (4.5 kg) as directed by your caregiver.  Inspect the site at least twice daily.  Do not drive home if you are discharged the same day of the procedure. Have someone else drive you.  You may drive 24 hours after the procedure unless otherwise instructed by your caregiver.  What to expect: Any bruising will usually fade within 1 to 2 weeks.  Blood that collects in the tissue (hematoma) may be painful  to the touch. It should usually decrease in size and tenderness within 1 to 2 weeks.  SEEK IMMEDIATE MEDICAL CARE IF: You have unusual pain at the groin site or down the affected leg.  You have redness, warmth, swelling, or pain at the groin site.  You have drainage (other than a small amount of blood on the dressing).  You have chills.  You have a fever or persistent symptoms for more than 72 hours.  You have a fever and your symptoms suddenly get worse.  Your leg becomes pale, cool, tingly, or numb.  You have heavy bleeding from the site. Hold pressure on the site. Marland Kitchen  PLEASE DO NOT MISS ANY DOSES OF YOUR PLAVIX!!!!! Also keep a log of you blood pressures and bring back to your follow up appt. Please call the office with any questions.   Patients taking blood thinners should generally stay away from medicines like ibuprofen, Advil, Motrin, naproxen, and Aleve due to risk of stomach bleeding. You may take Tylenol as directed or talk to your primary doctor about alternatives.  Some studies suggest Prilosec/Omeprazole interacts with Plavix. We changed your Prilosec/Omeprazole to the equivalent dose of Protonix for less chance of interaction.   Increase activity slowly   Complete by:  As directed       Discharge Medications     Medication List    STOP taking  these medications   amLODipine 10 MG tablet Commonly known as:  NORVASC   omeprazole 20 MG capsule Commonly known as:  PRILOSEC Replaced by:  pantoprazole 40 MG tablet     TAKE these medications   amitriptyline 50 MG tablet Commonly known as:  ELAVIL Take 50 mg by mouth at bedtime as needed for sleep.   aspirin 81 MG EC tablet Take 1 tablet (81 mg total) by mouth daily. Start taking on:  03/15/2017   atorvastatin 80 MG tablet Commonly known as:  LIPITOR Take 1 tablet (80 mg total) by mouth daily.   clopidogrel 75 MG tablet Commonly known as:  PLAVIX Take 1 tablet (75 mg total) by mouth daily.   escitalopram 20 MG tablet Commonly known as:  LEXAPRO Take 20 mg by mouth daily.   losartan 25 MG tablet Commonly known as:  COZAAR Take 1 tablet (25 mg total) by mouth daily. Start taking on:  03/15/2017   metoprolol succinate 25 MG 24 hr tablet Commonly known as:  TOPROL-XL Take 1 tablet (25 mg total) by mouth daily.   nitroGLYCERIN 0.4 MG SL tablet Commonly known as:  NITROSTAT Place 1 tablet (0.4 mg total) under the tongue every 5 (five) minutes x 3 doses as needed for chest pain.   pantoprazole 40 MG tablet Commonly known as:  PROTONIX Take 1 tablet (40 mg total) by mouth daily. Start taking on:  03/15/2017 Replaces:  omeprazole 20 MG capsule   rOPINIRole 1 MG tablet Commonly known as:  REQUIP Take 1 mg by mouth at bedtime.        Aspirin prescribed at discharge?  Yes High Intensity Statin Prescribed? (Lipitor 40-80mg  or Crestor 20-40mg ): Yes Beta Blocker Prescribed? Yes For EF <40%, was ACEI/ARB Prescribed? Yes ADP Receptor Inhibitor Prescribed? (i.e. Plavix etc.-Includes Medically Managed Patients): Yes For EF <40%, Aldosterone Inhibitor Prescribed? No Was EF assessed during THIS hospitalization? Yes Was Cardiac Rehab II ordered? (Included Medically managed Patients): Yes   Outstanding Labs/Studies   FLP/LFTs in 6 weeks if tolerating statin.   Duration of  Discharge Encounter   Greater than  30 minutes including physician time.  Signed, Laverda PageLindsay Munachimso Palin NP-C 03/14/2017, 1:08 PM

## 2017-03-14 NOTE — Progress Notes (Signed)
Patient is ready for discharge. Patient has all of his belongings. Patient has had all of his questions regarding discharge answered. Patient is alert and oriented and VS are stable. Patient IV has been DC'd and he tolerated well. Patient telemetry has been DC'd and removed. Patient will be transported home by his daughter, April. Patient will leave unit via wheelchair and meet daughter at the front entrance of the hospital.

## 2017-03-14 NOTE — Progress Notes (Signed)
CARDIAC REHAB PHASE I   PRE:  Rate/Rhythm: 79 SR  BP:  Supine: 120/79  Sitting:   Standing:    SaO2: 94%RA  MODE:  Ambulation: 470 ft   POST:  Rate/Rhythm: 93 SR  BP:  Supine:   Sitting: 130/88  Standing:    SaO2: 100%RA 1050-1122 Pt walked 470 ft on RA with steady gait. Not as SOB as last walk we had. He tolerated well. No CP. Encouraged pt to take his plavix and not to smoke. Wants to go home.   Luetta Nuttingharlene Maycol Hoying, RN BSN  03/14/2017 11:18 AM

## 2017-03-28 ENCOUNTER — Ambulatory Visit: Payer: Self-pay | Admitting: Physician Assistant

## 2017-03-29 ENCOUNTER — Ambulatory Visit: Payer: Self-pay | Admitting: Family Medicine

## 2017-04-16 ENCOUNTER — Other Ambulatory Visit: Payer: Self-pay | Admitting: Cardiology

## 2017-04-17 NOTE — Telephone Encounter (Signed)
Pt was in the hospital and was D/C with instructions to see Herm[place]le [place]. Pt does not have an appt set up yet. Please address
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
## 2017-04-30 ENCOUNTER — Encounter: Payer: Self-pay | Admitting: Student

## 2017-04-30 ENCOUNTER — Ambulatory Visit (INDEPENDENT_AMBULATORY_CARE_PROVIDER_SITE_OTHER): Payer: Medicare HMO | Admitting: Student

## 2017-04-30 VITALS — BP 120/76 | HR 87 | Ht 71.0 in | Wt 217.0 lb

## 2017-04-30 DIAGNOSIS — I712 Thoracic aortic aneurysm, without rupture, unspecified: Secondary | ICD-10-CM

## 2017-04-30 DIAGNOSIS — I10 Essential (primary) hypertension: Secondary | ICD-10-CM

## 2017-04-30 DIAGNOSIS — I25810 Atherosclerosis of coronary artery bypass graft(s) without angina pectoris: Secondary | ICD-10-CM | POA: Diagnosis not present

## 2017-04-30 DIAGNOSIS — E785 Hyperlipidemia, unspecified: Secondary | ICD-10-CM | POA: Diagnosis not present

## 2017-04-30 DIAGNOSIS — I255 Ischemic cardiomyopathy: Secondary | ICD-10-CM | POA: Diagnosis not present

## 2017-04-30 DIAGNOSIS — Z720 Tobacco use: Secondary | ICD-10-CM | POA: Diagnosis not present

## 2017-04-30 MED ORDER — CLOPIDOGREL BISULFATE 75 MG PO TABS: 75.0000 mg | ORAL_TABLET | Freq: Every day | ORAL | 3 refills | Status: DC

## 2017-04-30 MED ORDER — METOPROLOL SUCCINATE ER 25 MG PO TB24: 25.0000 mg | ORAL_TABLET | Freq: Every day | ORAL | 3 refills | Status: DC

## 2017-04-30 MED ORDER — ATORVASTATIN CALCIUM 80 MG PO TABS: 80.0000 mg | ORAL_TABLET | Freq: Every day | ORAL | 3 refills | Status: DC

## 2017-04-30 MED ORDER — LOSARTAN POTASSIUM 25 MG PO TABS: 25.0000 mg | ORAL_TABLET | Freq: Every day | ORAL | 3 refills | Status: DC

## 2017-04-30 NOTE — Patient Instructions (Signed)
Medication Instructions:  Your physician recommends that you continue on your current medications as directed. Please refer to the Current Medication list given to you today.   Labwork: NONE  Testing/Procedures: NOME  Follow-Up: Your physician recommends that you schedule a follow-up appointment in: 3-4 MONTHS   Any Other Special Instructions Will Be Listed Below (If Applicable).     If you need a refill on your cardiac medications before your next appointment, please call your pharmacy.

## 2017-05-14 ENCOUNTER — Other Ambulatory Visit (HOSPITAL_COMMUNITY): Payer: Self-pay | Admitting: Physician Assistant

## 2017-05-14 ENCOUNTER — Ambulatory Visit (HOSPITAL_COMMUNITY)
Admission: RE | Admit: 2017-05-14 | Discharge: 2017-05-14 | Disposition: A | Discharge status: Home / Self Care | Payer: Medicare HMO | Source: Ambulatory Visit | Attending: Physician Assistant | Admitting: Physician Assistant

## 2017-05-14 DIAGNOSIS — J209 Acute bronchitis, unspecified: Secondary | ICD-10-CM | POA: Diagnosis present

## 2017-05-14 DIAGNOSIS — R05 Cough: Secondary | ICD-10-CM

## 2017-05-14 DIAGNOSIS — R918 Other nonspecific abnormal finding of lung field: Secondary | ICD-10-CM | POA: Insufficient documentation

## 2017-05-14 DIAGNOSIS — R059 Cough, unspecified: Secondary | ICD-10-CM

## 2017-05-24 ENCOUNTER — Other Ambulatory Visit: Payer: Self-pay | Admitting: Cardiology

## 2017-05-24 NOTE — Telephone Encounter (Signed)
This is a  pt.  °

## 2017-08-09 ENCOUNTER — Ambulatory Visit: Payer: Medicare HMO | Admitting: Cardiovascular Disease

## 2017-08-10 ENCOUNTER — Encounter: Payer: Self-pay | Admitting: Cardiovascular Disease

## 2017-10-17 ENCOUNTER — Encounter: Payer: Self-pay | Admitting: Cardiovascular Disease

## 2017-12-03 ENCOUNTER — Other Ambulatory Visit: Payer: Self-pay | Admitting: Student

## 2018-04-22 ENCOUNTER — Other Ambulatory Visit: Payer: Self-pay | Admitting: Student

## 2018-05-27 ENCOUNTER — Other Ambulatory Visit: Payer: Self-pay | Admitting: Student

## 2018-06-22 ENCOUNTER — Other Ambulatory Visit: Payer: Self-pay | Admitting: Student

## 2018-06-24 ENCOUNTER — Other Ambulatory Visit: Payer: Self-pay

## 2018-06-24 MED ORDER — PANTOPRAZOLE SODIUM 40 MG PO TBEC: 40.0000 mg | DELAYED_RELEASE_TABLET | Freq: Every day | ORAL | 1 refills | Status: DC

## 2018-06-24 NOTE — Telephone Encounter (Signed)
Refilled protonix

## 2018-07-06 ENCOUNTER — Other Ambulatory Visit: Payer: Self-pay | Admitting: Student

## 2018-07-08 ENCOUNTER — Telehealth: Payer: Self-pay | Admitting: Student

## 2018-07-08 ENCOUNTER — Other Ambulatory Visit: Payer: Self-pay | Admitting: Student

## 2018-07-08 MED ORDER — METOPROLOL SUCCINATE ER 25 MG PO TB24: 25.0000 mg | ORAL_TABLET | Freq: Every day | ORAL | 3 refills | Status: DC

## 2018-07-08 MED ORDER — LOSARTAN POTASSIUM 25 MG PO TABS: 25.0000 mg | ORAL_TABLET | Freq: Every day | ORAL | 3 refills | Status: DC

## 2018-07-08 NOTE — Telephone Encounter (Signed)
losartan (COZAAR) 25 MG tablet   metoprolol succinate (TOPROL-XL) 25 MG 24 hr tablet

## 2018-07-08 NOTE — Telephone Encounter (Signed)
Done

## 2018-07-08 NOTE — Telephone Encounter (Signed)
°*  STAT* If patient is at the pharmacy, call can be transferred to refill team.   1. Which medications need to be refilled? atorvastatin (LIPITOR) 80 MG tablet   2. Which pharmacy/location (including street and city if local pharmacy) is medication to be sent to? [facility] - [place], [place]  3. Do they need a 30 day or 90 day supply? 30

## 2018-08-27 LAB — HM HEPATITIS C SCREENING LAB: HM Hepatitis Screen: NEGATIVE

## 2018-09-15 ENCOUNTER — Other Ambulatory Visit: Payer: Self-pay

## 2018-09-15 ENCOUNTER — Encounter (HOSPITAL_COMMUNITY): Payer: Self-pay | Admitting: Emergency Medicine

## 2018-09-15 ENCOUNTER — Observation Stay (HOSPITAL_COMMUNITY)
Admission: EM | Admit: 2018-09-15 | Discharge: 2018-09-16 | Disposition: A | Discharge status: Home / Self Care | Payer: Medicare HMO | Attending: Internal Medicine | Admitting: Internal Medicine

## 2018-09-15 ENCOUNTER — Emergency Department (HOSPITAL_COMMUNITY): Payer: Medicare HMO

## 2018-09-15 DIAGNOSIS — I160 Hypertensive urgency: Secondary | ICD-10-CM | POA: Diagnosis not present

## 2018-09-15 DIAGNOSIS — I208 Other forms of angina pectoris: Secondary | ICD-10-CM

## 2018-09-15 DIAGNOSIS — J45909 Unspecified asthma, uncomplicated: Secondary | ICD-10-CM | POA: Diagnosis not present

## 2018-09-15 DIAGNOSIS — I110 Hypertensive heart disease with heart failure: Secondary | ICD-10-CM | POA: Diagnosis not present

## 2018-09-15 DIAGNOSIS — I2510 Atherosclerotic heart disease of native coronary artery without angina pectoris: Secondary | ICD-10-CM | POA: Insufficient documentation

## 2018-09-15 DIAGNOSIS — Z79899 Other long term (current) drug therapy: Secondary | ICD-10-CM | POA: Insufficient documentation

## 2018-09-15 DIAGNOSIS — R079 Chest pain, unspecified: Secondary | ICD-10-CM | POA: Diagnosis not present

## 2018-09-15 DIAGNOSIS — I10 Essential (primary) hypertension: Secondary | ICD-10-CM | POA: Diagnosis not present

## 2018-09-15 DIAGNOSIS — Z951 Presence of aortocoronary bypass graft: Secondary | ICD-10-CM | POA: Diagnosis not present

## 2018-09-15 DIAGNOSIS — J441 Chronic obstructive pulmonary disease with (acute) exacerbation: Secondary | ICD-10-CM | POA: Diagnosis not present

## 2018-09-15 DIAGNOSIS — Z7902 Long term (current) use of antithrombotics/antiplatelets: Secondary | ICD-10-CM | POA: Insufficient documentation

## 2018-09-15 DIAGNOSIS — Z03818 Encounter for observation for suspected exposure to other biological agents ruled out: Secondary | ICD-10-CM | POA: Insufficient documentation

## 2018-09-15 DIAGNOSIS — F17210 Nicotine dependence, cigarettes, uncomplicated: Secondary | ICD-10-CM | POA: Insufficient documentation

## 2018-09-15 DIAGNOSIS — I252 Old myocardial infarction: Secondary | ICD-10-CM | POA: Diagnosis not present

## 2018-09-15 DIAGNOSIS — I25709 Atherosclerosis of coronary artery bypass graft(s), unspecified, with unspecified angina pectoris: Secondary | ICD-10-CM | POA: Diagnosis not present

## 2018-09-15 DIAGNOSIS — Z7982 Long term (current) use of aspirin: Secondary | ICD-10-CM | POA: Diagnosis not present

## 2018-09-15 DIAGNOSIS — E785 Hyperlipidemia, unspecified: Secondary | ICD-10-CM | POA: Diagnosis not present

## 2018-09-15 DIAGNOSIS — I5020 Unspecified systolic (congestive) heart failure: Secondary | ICD-10-CM | POA: Diagnosis not present

## 2018-09-15 LAB — CBC WITH DIFFERENTIAL/PLATELET
Abs Immature Granulocytes: 0.02 10*3/uL (ref 0.00–0.07)
Basophils Absolute: 0.1 10*3/uL (ref 0.0–0.1)
Basophils Relative: 1 %
Eosinophils Absolute: 0.2 10*3/uL (ref 0.0–0.5)
Eosinophils Relative: 2 %
HCT: 48.4 % (ref 39.0–52.0)
Hemoglobin: 16.4 g/dL (ref 13.0–17.0)
Immature Granulocytes: 0 %
Lymphocytes Relative: 28 %
Lymphs Abs: 2.9 10*3/uL (ref 0.7–4.0)
MCH: 32.3 pg (ref 26.0–34.0)
MCHC: 33.9 g/dL (ref 30.0–36.0)
MCV: 95.3 fL (ref 80.0–100.0)
Monocytes Absolute: 0.9 10*3/uL (ref 0.1–1.0)
Monocytes Relative: 8 %
Neutro Abs: 6.3 10*3/uL (ref 1.7–7.7)
Neutrophils Relative %: 61 %
Platelets: 216 10*3/uL (ref 150–400)
RBC: 5.08 MIL/uL (ref 4.22–5.81)
RDW: 14.9 % (ref 11.5–15.5)
WBC: 10.3 10*3/uL (ref 4.0–10.5)
nRBC: 0 % (ref 0.0–0.2)

## 2018-09-15 LAB — BASIC METABOLIC PANEL
Anion gap: 14 (ref 5–15)
BUN: 16 mg/dL (ref 8–23)
CO2: 24 mmol/L (ref 22–32)
Calcium: 9.3 mg/dL (ref 8.9–10.3)
Chloride: 100 mmol/L (ref 98–111)
Creatinine, Ser: 1.07 mg/dL (ref 0.61–1.24)
GFR calc Af Amer: >60 mL/min (ref >60)
GFR calc non Af Amer: >60 mL/min (ref >60)
Glucose, Bld: 96 mg/dL (ref 70–99)
Potassium: 3.9 mmol/L (ref 3.5–5.1)
Sodium: 138 mmol/L (ref 135–145)

## 2018-09-15 LAB — TROPONIN I (HIGH SENSITIVITY)
Troponin I (High Sensitivity): 9 ng/L (ref <18)
Troponin I (High Sensitivity): 8 ng/L (ref <18)
Troponin I (High Sensitivity): 6 ng/L (ref <18)
Troponin I (High Sensitivity): 4 ng/L (ref <18)

## 2018-09-15 LAB — SARS CORONAVIRUS 2 BY RT PCR (HOSPITAL ORDER, PERFORMED IN ~~LOC~~ HOSPITAL LAB): SARS Coronavirus 2: NEGATIVE

## 2018-09-15 MED ORDER — BUDESONIDE 0.5 MG/2ML IN SUSP
0.5000 mg | Freq: Two times a day (BID) | RESPIRATORY_TRACT | Status: DC
  Administered 2018-09-15 – 2018-09-16 (×2): 0.5 mg via RESPIRATORY_TRACT
  Filled 2018-09-15 (×2): qty 2

## 2018-09-15 MED ORDER — ROPINIROLE HCL 1 MG PO TABS
1.0000 mg | ORAL_TABLET | Freq: Every day | ORAL | Status: DC
  Administered 2018-09-15: 1 mg via ORAL
  Filled 2018-09-15: qty 1

## 2018-09-15 MED ORDER — CLOPIDOGREL BISULFATE 75 MG PO TABS
75.0000 mg | ORAL_TABLET | Freq: Every day | ORAL | Status: DC
  Administered 2018-09-15 – 2018-09-16 (×2): 75 mg via ORAL
  Filled 2018-09-15 (×2): qty 1

## 2018-09-15 MED ORDER — ENOXAPARIN SODIUM 40 MG/0.4ML ~~LOC~~ SOLN
40.0000 mg | SUBCUTANEOUS | Status: DC
  Administered 2018-09-15: 18:00:00 40 mg via SUBCUTANEOUS
  Filled 2018-09-15: qty 0.4

## 2018-09-15 MED ORDER — PANTOPRAZOLE SODIUM 40 MG PO TBEC
40.0000 mg | DELAYED_RELEASE_TABLET | Freq: Every day | ORAL | Status: DC
  Administered 2018-09-15 – 2018-09-16 (×2): 40 mg via ORAL
  Filled 2018-09-15 (×2): qty 1

## 2018-09-15 MED ORDER — METHYLPREDNISOLONE SODIUM SUCC 125 MG IJ SOLR
60.0000 mg | Freq: Two times a day (BID) | INTRAMUSCULAR | Status: DC
  Administered 2018-09-16: 03:00:00 60 mg via INTRAVENOUS
  Filled 2018-09-15: qty 2

## 2018-09-15 MED ORDER — ONDANSETRON HCL 4 MG PO TABS: 4.0000 mg | ORAL_TABLET | Freq: Four times a day (QID) | ORAL | Status: DC | PRN

## 2018-09-15 MED ORDER — METOPROLOL TARTRATE 25 MG PO TABS
25.0000 mg | ORAL_TABLET | Freq: Once | ORAL | Status: AC
  Administered 2018-09-15: 25 mg via ORAL
  Filled 2018-09-15: qty 1

## 2018-09-15 MED ORDER — ACETAMINOPHEN 325 MG PO TABS
650.0000 mg | ORAL_TABLET | Freq: Four times a day (QID) | ORAL | Status: DC | PRN
  Administered 2018-09-16: 08:00:00 650 mg via ORAL
  Filled 2018-09-15: qty 2

## 2018-09-15 MED ORDER — LOSARTAN POTASSIUM 50 MG PO TABS
25.0000 mg | ORAL_TABLET | Freq: Every day | ORAL | Status: DC
  Administered 2018-09-15 – 2018-09-16 (×2): 25 mg via ORAL
  Filled 2018-09-15 (×2): qty 1

## 2018-09-15 MED ORDER — ATORVASTATIN CALCIUM 40 MG PO TABS
80.0000 mg | ORAL_TABLET | Freq: Every day | ORAL | Status: DC
  Administered 2018-09-15: 80 mg via ORAL
  Filled 2018-09-15 (×2): qty 2

## 2018-09-15 MED ORDER — ESCITALOPRAM OXALATE 10 MG PO TABS
20.0000 mg | ORAL_TABLET | Freq: Every day | ORAL | Status: DC
  Administered 2018-09-15 – 2018-09-16 (×2): 20 mg via ORAL
  Filled 2018-09-15 (×2): qty 2

## 2018-09-15 MED ORDER — SODIUM CHLORIDE 0.9% FLUSH
3.0000 mL | Freq: Once | INTRAVENOUS | Status: AC
  Administered 2018-09-15: 3 mL via INTRAVENOUS

## 2018-09-15 MED ORDER — AMITRIPTYLINE HCL 25 MG PO TABS: 50.0000 mg | ORAL_TABLET | Freq: Every evening | ORAL | Status: DC | PRN

## 2018-09-15 MED ORDER — ACETAMINOPHEN 650 MG RE SUPP: 650.0000 mg | Freq: Four times a day (QID) | RECTAL | Status: DC | PRN

## 2018-09-15 MED ORDER — IPRATROPIUM-ALBUTEROL 0.5-2.5 (3) MG/3ML IN SOLN
3.0000 mL | Freq: Once | RESPIRATORY_TRACT | Status: AC
  Administered 2018-09-15: 14:00:00 3 mL via RESPIRATORY_TRACT
  Filled 2018-09-15: qty 3

## 2018-09-15 MED ORDER — LOSARTAN POTASSIUM 25 MG PO TABS
25.0000 mg | ORAL_TABLET | Freq: Once | ORAL | Status: AC
  Administered 2018-09-15: 10:00:00 25 mg via ORAL
  Filled 2018-09-15: qty 1

## 2018-09-15 MED ORDER — ASPIRIN 81 MG PO CHEW: 324.0000 mg | CHEWABLE_TABLET | Freq: Once | ORAL | Status: AC

## 2018-09-15 MED ORDER — BUPROPION HCL ER (XL) 150 MG PO TB24
150.0000 mg | ORAL_TABLET | Freq: Every morning | ORAL | Status: DC
  Administered 2018-09-16: 08:00:00 150 mg via ORAL
  Filled 2018-09-15: qty 1

## 2018-09-15 MED ORDER — METHYLPREDNISOLONE SODIUM SUCC 125 MG IJ SOLR
125.0000 mg | Freq: Once | INTRAMUSCULAR | Status: AC
  Administered 2018-09-15: 125 mg via INTRAVENOUS
  Filled 2018-09-15: qty 2

## 2018-09-15 MED ORDER — ASPIRIN EC 81 MG PO TBEC
81.0000 mg | DELAYED_RELEASE_TABLET | Freq: Every day | ORAL | Status: DC
  Administered 2018-09-15 – 2018-09-16 (×2): 81 mg via ORAL
  Filled 2018-09-15 (×4): qty 1

## 2018-09-15 MED ORDER — ONDANSETRON HCL 4 MG/2ML IJ SOLN
4.0000 mg | Freq: Four times a day (QID) | INTRAMUSCULAR | Status: DC | PRN
  Administered 2018-09-16: 4 mg via INTRAVENOUS
  Filled 2018-09-15: qty 2

## 2018-09-15 MED ORDER — LABETALOL HCL 5 MG/ML IV SOLN
10.0000 mg | Freq: Once | INTRAVENOUS | Status: AC
  Administered 2018-09-15: 13:00:00 10 mg via INTRAVENOUS
  Filled 2018-09-15: qty 4

## 2018-09-15 MED ORDER — IPRATROPIUM-ALBUTEROL 0.5-2.5 (3) MG/3ML IN SOLN
3.0000 mL | Freq: Four times a day (QID) | RESPIRATORY_TRACT | Status: DC
  Administered 2018-09-15 (×2): 3 mL via RESPIRATORY_TRACT
  Filled 2018-09-15: qty 3

## 2018-09-15 MED ORDER — METOPROLOL SUCCINATE ER 25 MG PO TB24
25.0000 mg | ORAL_TABLET | Freq: Every day | ORAL | Status: DC
  Administered 2018-09-15 – 2018-09-16 (×2): 25 mg via ORAL
  Filled 2018-09-15 (×2): qty 1

## 2018-09-15 NOTE — ED Triage Notes (Signed)
Patient came in by EMS. Patient states chest pain that started about 5 hours ago. Patient states chest pressure. Patient does have a cardiac history. EMS EKG report depression and a bundle branch block. Patient is hypertensive and ran out of his blood pressure medications today. Patient last took blood pressure medication yesterday. Patient had taken 324 mg's of ASA and 1 nitro given by EMS.
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
## 2018-09-15 NOTE — Progress Notes (Signed)
Patient requesting prescription for Albuterol for his home nebulizer machine/

## 2018-09-16 ENCOUNTER — Observation Stay (HOSPITAL_BASED_OUTPATIENT_CLINIC_OR_DEPARTMENT_OTHER): Payer: Medicare HMO

## 2018-09-16 ENCOUNTER — Other Ambulatory Visit: Payer: Self-pay

## 2018-09-16 ENCOUNTER — Encounter (HOSPITAL_COMMUNITY): Payer: Self-pay | Admitting: Physician Assistant

## 2018-09-16 DIAGNOSIS — I10 Essential (primary) hypertension: Secondary | ICD-10-CM | POA: Diagnosis not present

## 2018-09-16 DIAGNOSIS — R0789 Other chest pain: Secondary | ICD-10-CM

## 2018-09-16 DIAGNOSIS — I351 Nonrheumatic aortic (valve) insufficiency: Secondary | ICD-10-CM

## 2018-09-16 DIAGNOSIS — R079 Chest pain, unspecified: Secondary | ICD-10-CM | POA: Diagnosis not present

## 2018-09-16 DIAGNOSIS — I25709 Atherosclerosis of coronary artery bypass graft(s), unspecified, with unspecified angina pectoris: Secondary | ICD-10-CM | POA: Diagnosis not present

## 2018-09-16 DIAGNOSIS — J441 Chronic obstructive pulmonary disease with (acute) exacerbation: Secondary | ICD-10-CM | POA: Diagnosis not present

## 2018-09-16 LAB — HEMOGLOBIN A1C
Hgb A1c MFr Bld: 5.4 % (ref 4.8–5.6)
Mean Plasma Glucose: 108.28 mg/dL

## 2018-09-16 LAB — BASIC METABOLIC PANEL
Anion gap: 12 (ref 5–15)
BUN: 20 mg/dL (ref 8–23)
CO2: 23 mmol/L (ref 22–32)
Calcium: 9.3 mg/dL (ref 8.9–10.3)
Chloride: 100 mmol/L (ref 98–111)
Creatinine, Ser: 1.13 mg/dL (ref 0.61–1.24)
GFR calc Af Amer: >60 mL/min (ref >60)
GFR calc non Af Amer: >60 mL/min (ref >60)
Glucose, Bld: 136 mg/dL — ABNORMAL HIGH (ref 70–99)
Potassium: 4.1 mmol/L (ref 3.5–5.1)
Sodium: 135 mmol/L (ref 135–145)

## 2018-09-16 LAB — LIPID PANEL
Cholesterol: 189 mg/dL (ref 0–200)
HDL: 67 mg/dL (ref >40)
LDL Cholesterol: 105 mg/dL — ABNORMAL HIGH (ref 0–99)
Total CHOL/HDL Ratio: 2.8 RATIO
Triglycerides: 87 mg/dL (ref <150)
VLDL: 17 mg/dL (ref 0–40)

## 2018-09-16 LAB — ECHOCARDIOGRAM COMPLETE
Height: 71 in
Weight: 3499.14 oz

## 2018-09-16 LAB — TROPONIN I (HIGH SENSITIVITY): Troponin I (High Sensitivity): 4 ng/L (ref <18)

## 2018-09-16 MED ORDER — PREDNISONE 20 MG PO TABS: 50.0000 mg | ORAL_TABLET | Freq: Every day | ORAL | Status: DC

## 2018-09-16 MED ORDER — NICOTINE 21 MG/24HR TD PT24
21.0000 mg | MEDICATED_PATCH | Freq: Every day | TRANSDERMAL | Status: DC
  Administered 2018-09-16: 21 mg via TRANSDERMAL
  Filled 2018-09-16: qty 1

## 2018-09-16 MED ORDER — IPRATROPIUM-ALBUTEROL 0.5-2.5 (3) MG/3ML IN SOLN: 3.0000 mL | Freq: Three times a day (TID) | RESPIRATORY_TRACT | 0 refills | Status: DC

## 2018-09-16 MED ORDER — IPRATROPIUM-ALBUTEROL 0.5-2.5 (3) MG/3ML IN SOLN
3.0000 mL | Freq: Three times a day (TID) | RESPIRATORY_TRACT | Status: DC
  Administered 2018-09-16 (×2): 3 mL via RESPIRATORY_TRACT
  Filled 2018-09-16 (×2): qty 3

## 2018-09-16 MED ORDER — PREDNISONE 50 MG PO TABS: 50.0000 mg | ORAL_TABLET | Freq: Every day | ORAL | 0 refills | Status: DC

## 2018-09-16 NOTE — Care Management Obs Status (Signed)
[place] NOTIFICATION   Patient Details  Name: Kevin Smith MRN: [id number] Date of Birth: April 04, 1955   Medicare Observation Status Notification Given:  Yes    Tommy Medal 09/16/2018, 2:52 PM
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
## 2018-09-16 NOTE — Progress Notes (Signed)
*  PRELIMINARY RESULTS* Echocardiogram 2D Echocardiogram has been performed.  Leavy Cella 09/16/2018, 11:58 AM

## 2018-09-16 NOTE — Progress Notes (Signed)
Iv, tele removed. D/C instructions reviewed with patient, verbalized understanding. Left unit ambulatory accompanied by this nurse to shirt stay entrance to await ride.

## 2018-09-16 NOTE — Discharge Summary (Signed)
Physician Discharge Summary  Katharina CaperRoger Canepa ZOX:[id number] DOB: 03/27/1955 DOA: 09/15/2018  PCP: Domenick BookbinderHess, Kristin M, PA-C  Admit date: 09/15/2018 Discharge date: 09/16/2018  Admitted From: Home Disposition:  Home  Recommendations for Outpatient Follow-up:  1. Follow up with PCP in 1-2 weeks 2. Please obtain BMP/CBC in one week   Discharge Condition: Stable CODE STATUS:FULL Diet recommendation: Heart Healthy    Brief/Interim Summary: 63 y.o. male with medical history of thoracic aortic aneurysm COPD, tobacco abuse, hypertension, depression, hyperlipidemia, and coronary artery disease presenting with 2-day history of chest discomfort with associated shortness of breath.  He is s/p CABG in 01/2013 with LIMA-LAD, SVG-OM, and SVG-PDA, subsequent STEMI in 2015 with occluded SVG-OM and intervention with DES to native LCx at that time.  The patient had a code [facility]I at [facility] on 07/10/2016 and underwent urgent cardiac catheterization he was noted to have an acute total occlusion of the SVG-PDA which was treated with PTCA, AngioJet thrombectomy, and stent placement.  He was discharged home with aspirin and Plavix.  He has been lost to follow-up for over 1 year, but continues to endorse compliance with his medicines.  However when reviewing his medication bottles, it appears that he has been out of medications for approximately 3 to 4 days.  Nevertheless, the patient states that he has noted some chest discomfort with walking that started on 10/10/2018.  In addition, the patient was "up and about" in the early morning of 09/15/2018 and experienced chest discomfort with associated shortness of breath and nausea.  EMS was activated.  The patient was given aspirin and nitroglycerin by EMS with relief of his symptoms.  He has pain-free at the time of my interview.  The patient is a vague historian requiring asking similar questions more on multiple occasions.  In essence, the patient has endorsed "not feeling right"  with shortness of breath and chest discomfort for the past 2 days.  He denies any fevers, chills, nausea, vomiting, diarrhea, abdominal pain, coughing, hemoptysis, flulike symptoms.  Unfortunately, he continues to smoke 2 packs/day.  When he is stressed out, he states that he is can smoke up to 80 cigarettes a day.  In the emergency department, the patient was afebrile hemodynamically stable saturating 95% on room air.  BMP was unremarkable.  Troponin was 9 >>>8.  EKG shows sinus rhythm with right bundle branch block.  Patient was admitted for further evaluation and observation.  Discharge Diagnoses:  COPD exacerbation -Started duo nebs -Started Pulmicort -Started IV Solu-Medrol>>>d/c home with prednisone burst 50 mg daily x 4 more days -ambulatory pulse ox on the day of d/c did not show any desaturation <88%  Chest pain -Concerning for angina -appreciate cardiology consult-->with stable echo, pt can d/c then follow up outpt for myoview -troponin 9>>>8>>>6>>>4 -Echocardiogram--EF 45-50%, impaired relaxation, mild AI -Continue aspirin and Plavix -Continue statin  Tobacco abuse -He has little desire to quit -I have discussed tobacco cessation with the patient.  I have counseled the patient regarding the negative impacts of continued tobacco use including but not limited to lung cancer, COPD, and cardiovascular disease.  I have discussed alternatives to tobacco and modalities that may help facilitate tobacco cessation including but not limited to biofeedback, hypnosis, and medications.  Total time spent with tobacco counseling was 4 minutes.  Essential hypertension -After reviewing his bottles, the patient appears to be out of medications for the last 3 to 4 days -restarted metoprolol succinate, losartan  Coronary artery disease s/p CABG in 01/2013 with LIMA-LAD,  SVG-OM, and SVG-PDA, subsequent STEMI in 2015 with occluded SVG-OM and intervention with DES to native LCx at that  time. -Continue aspirin, Plavix, metoprolol, statin  Hyperlipidemia -Continue statin    Discharge Instructions   Allergies as of 09/16/2018      Reactions   Sulfa Antibiotics Rash      Medication List    TAKE these medications   acetaminophen 325 MG tablet Commonly known as: TYLENOL Take 650 mg by mouth every 6 (six) hours as needed.   amitriptyline 50 MG tablet Commonly known as: ELAVIL Take 50 mg by mouth at bedtime as needed for sleep.   aspirin 81 MG EC tablet Take 1 tablet (81 mg total) by mouth daily.   atorvastatin 80 MG tablet Commonly known as: LIPITOR TAKE 1 TABLET BY MOUTH ONCE DAILY. PLEASE CALL TO SCHEDULE OVERDUE APPT FOR FUTURE REFILLS   budesonide-formoterol 160-4.5 MCG/ACT inhaler Commonly known as: SYMBICORT Inhale 2 puffs into the lungs 2 (two) times a day.   buPROPion 150 MG 24 hr tablet Commonly known as: WELLBUTRIN XL Take 150 mg by mouth every morning.   clopidogrel 75 MG tablet Commonly known as: PLAVIX Take 1 tablet (75 mg total) by mouth daily.   escitalopram 20 MG tablet Commonly known as: LEXAPRO Take 20 mg by mouth daily.   ipratropium-albuterol 0.5-2.5 (3) MG/3ML Soln Commonly known as: DUONEB Take 3 mLs by nebulization 3 (three) times daily.   losartan 25 MG tablet Commonly known as: COZAAR Take 1 tablet (25 mg total) by mouth daily. Please call to schedule overdue appt for future refills (1st attempt)   metoprolol succinate 25 MG 24 hr tablet Commonly known as: TOPROL-XL Take 1 tablet (25 mg total) by mouth daily. Please schedule appt for future refills 1st attempt.   nitroGLYCERIN 0.4 MG SL tablet Commonly known as: NITROSTAT Place 1 tablet (0.4 mg total) under the tongue every 5 (five) minutes x 3 doses as needed for chest pain.   pantoprazole 40 MG tablet Commonly known as: PROTONIX Take 1 tablet (40 mg total) by mouth daily.   predniSONE 50 MG tablet Commonly known as: DELTASONE Take 1 tablet (50 mg total) by  mouth daily with breakfast. Start taking on: September 17, 2018   rOPINIRole 1 MG tablet Commonly known as: REQUIP Take 1 mg by mouth at bedtime.   Thera Tabs Take 1 tablet by mouth daily.   Ventolin HFA 108 (90 Base) MCG/ACT inhaler Generic drug: albuterol Inhale 2 puffs into the lungs every 6 (six) hours as needed.       Allergies  Allergen Reactions   Sulfa Antibiotics Rash    Consultations:  cardiology   Procedures/Studies: Dg Chest Portable 1 View  Result Date: 09/15/2018 CLINICAL DATA:  Chest pain beginning 5 hours ago.  Chest pressure. EXAM: PORTABLE CHEST 1 VIEW COMPARISON:  05/14/2017 FINDINGS: Previous median sternotomy and CABG. Heart size upper limits of normal. Aortic atherosclerosis. The pulmonary vascularity is normal. Lungs show mild scarring but are otherwise clear. No effusions. No acute bone finding. IMPRESSION: No active disease.  Previous CABG.  Aortic atherosclerosis. Electronically Signed   By: Paulina FusiMark  Shogry M.D.   On: 09/15/2018 07:07        Discharge Exam: Vitals:   09/16/18 0714 09/16/18 1250  BP: (!) 139/98 127/80  Pulse: 87 85  Resp: 18 19  Temp: 98.2 F (36.8 C) 98.5 F (36.9 C)  SpO2: (!) 89% 91%   Vitals:   09/15/18 1949 09/15/18 2333 09/16/18 [id number]  09/16/18 1250  BP:  (!) 142/99 (!) 139/98 127/80  Pulse: 82 88 87 85  Resp: 18 18 18 19   Temp:  98.2 F (36.8 C) 98.2 F (36.8 C) 98.5 F (36.9 C)  TempSrc:  Oral    SpO2: 92% 92% (!) 89% 91%  Weight:      Height:        General: Pt is alert, awake, not in acute distress Cardiovascular: RRR, S1/S2 +, no rubs, no gallops Respiratory: bibasilar rales. No wheeze Abdominal: Soft, NT, ND, bowel sounds + Extremities: no edema, no cyanosis   The results of significant diagnostics from this hospitalization (including imaging, microbiology, ancillary and laboratory) are listed below for reference.    Significant Diagnostic Studies: Dg Chest Portable 1 View  Result Date:  09/15/2018 CLINICAL DATA:  Chest pain beginning 5 hours ago.  Chest pressure. EXAM: PORTABLE CHEST 1 VIEW COMPARISON:  05/14/2017 FINDINGS: Previous median sternotomy and CABG. Heart size upper limits of normal. Aortic atherosclerosis. The pulmonary vascularity is normal. Lungs show mild scarring but are otherwise clear. No effusions. No acute bone finding. IMPRESSION: No active disease.  Previous CABG.  Aortic atherosclerosis. Electronically Signed   By: Paulina FusiMark  Shogry M.D.   On: 09/15/2018 07:07     Microbiology: Recent Results (from the past 240 hour(s))  SARS Coronavirus 2 (CEPHEID - Performed in [facility] hospital lab), Hosp Order     Status: None   Collection Time: 09/15/18 12:40 PM   Specimen: Nasopharyngeal Swab  Result Value Ref Range Status   SARS Coronavirus 2 NEGATIVE NEGATIVE Final    Comment: (NOTE) If result is NEGATIVE SARS-CoV-2 target nucleic acids are NOT DETECTED. The SARS-CoV-2 RNA is generally detectable in upper and lower  respiratory specimens during the acute phase of infection. The lowest  concentration of SARS-CoV-2 viral copies this assay can detect is 250  copies / mL. A negative result does not preclude SARS-CoV-2 infection  and should not be used as the sole basis for treatment or other  patient management decisions.  A negative result may occur with  improper specimen collection / handling, submission of specimen other  than nasopharyngeal swab, presence of viral mutation(s) within the  areas targeted by this assay, and inadequate number of viral copies  (<250 copies / mL). A negative result must be combined with clinical  observations, patient history, and epidemiological information. If result is POSITIVE SARS-CoV-2 target nucleic acids are DETECTED. The SARS-CoV-2 RNA is generally detectable in upper and lower  respiratory specimens dur ing the acute phase of infection.  Positive  results are indicative of active infection with SARS-CoV-2.  Clinical   correlation with patient history and other diagnostic information is  necessary to determine patient infection status.  Positive results do  not rule out bacterial infection or co-infection with other viruses. If result is PRESUMPTIVE POSTIVE SARS-CoV-2 nucleic acids MAY BE PRESENT.   A presumptive positive result was obtained on the submitted specimen  and confirmed on repeat testing.  While 2019 novel coronavirus  (SARS-CoV-2) nucleic acids may be present in the submitted sample  additional confirmatory testing may be necessary for epidemiological  and / or clinical management purposes  to differentiate between  SARS-CoV-2 and other Sarbecovirus currently known to infect humans.  If clinically indicated additional testing with an alternate test  methodology [phone] is advised. The SARS-CoV-2 RNA is generally  detectable in upper and lower respiratory sp ecimens during the acute  phase of infection. The expected result is  Negative. Fact Sheet for Patients:  StrictlyIdeas.no Fact Sheet for Healthcare Providers: BankingDealers.co.za This test is not yet approved or cleared by the [place] FDA and has been authorized for detection and/or diagnosis of SARS-CoV-2 by FDA under an Emergency Use Authorization (EUA).  This EUA will remain in effect (meaning this test can be used) for the duration of the COVID-19 declaration under Section 564(b)(1) of the Act, 21 U.S.C. section 360bbb-3(b)(1), unless the authorization is terminated or revoked sooner. Performed at [facility], [address] [place], [place] [address]      Labs: Basic Metabolic Panel: Recent Labs  Lab 09/15/18 0755 09/16/18 0504  NA 138 135  K 3.9 4.1  CL 100 100  CO2 24 23  GLUCOSE 96 136*  BUN 16 20  CREATININE 1.07 1.13  CALCIUM 9.3 9.3   Liver Function Tests: No results for input(s): AST, ALT, ALKPHOS, BILITOT, PROT, ALBUMIN in the last 168 hours. No  results for input(s): LIPASE, AMYLASE in the last 168 hours. No results for input(s): AMMONIA in the last 168 hours. CBC: Recent Labs  Lab 09/15/18 0755  WBC 10.3  NEUTROABS 6.3  HGB 16.4  HCT 48.4  MCV 95.3  PLT 216   Cardiac Enzymes: No results for input(s): CKTOTAL, CKMB, CKMBINDEX, TROPONINI in the last 168 hours. BNP: Invalid input(s): POCBNP CBG: No results for input(s): GLUCAP in the last 168 hours.  Time coordinating discharge:  36 minutes  Signed:  Orson Eva, DO [facility] Pager: [phone] 09/16/2018, 2:59 PM

## 2018-09-17 LAB — HIV ANTIBODY (ROUTINE TESTING W REFLEX): HIV Screen 4th Generation wRfx: NONREACTIVE

## 2018-10-10 ENCOUNTER — Ambulatory Visit (INDEPENDENT_AMBULATORY_CARE_PROVIDER_SITE_OTHER): Payer: Medicare HMO | Admitting: Student

## 2018-10-10 ENCOUNTER — Other Ambulatory Visit: Payer: Self-pay

## 2018-10-10 ENCOUNTER — Encounter: Payer: Self-pay | Admitting: Student

## 2018-10-10 VITALS — BP 145/95 | HR 92 | Temp 97.1°F | Ht 71.0 in | Wt 220.0 lb

## 2018-10-10 DIAGNOSIS — I712 Thoracic aortic aneurysm, without rupture, unspecified: Secondary | ICD-10-CM

## 2018-10-10 DIAGNOSIS — E785 Hyperlipidemia, unspecified: Secondary | ICD-10-CM

## 2018-10-10 DIAGNOSIS — I2510 Atherosclerotic heart disease of native coronary artery without angina pectoris: Secondary | ICD-10-CM

## 2018-10-10 DIAGNOSIS — I255 Ischemic cardiomyopathy: Secondary | ICD-10-CM

## 2018-10-10 DIAGNOSIS — Z720 Tobacco use: Secondary | ICD-10-CM

## 2018-10-10 DIAGNOSIS — I10 Essential (primary) hypertension: Secondary | ICD-10-CM

## 2018-10-10 DIAGNOSIS — F1010 Alcohol abuse, uncomplicated: Secondary | ICD-10-CM

## 2018-10-10 MED ORDER — CLOPIDOGREL BISULFATE 75 MG PO TABS: 75.0000 mg | ORAL_TABLET | Freq: Every day | ORAL | 3 refills | Status: DC

## 2018-10-10 MED ORDER — LOSARTAN POTASSIUM 25 MG PO TABS: 25.0000 mg | ORAL_TABLET | Freq: Every day | ORAL | 3 refills | Status: DC

## 2018-10-10 MED ORDER — ATORVASTATIN CALCIUM 80 MG PO TABS: ORAL_TABLET | ORAL | 3 refills | Status: DC

## 2018-10-10 MED ORDER — METOPROLOL SUCCINATE ER 25 MG PO TB24: 25.0000 mg | ORAL_TABLET | Freq: Every day | ORAL | 3 refills | Status: DC

## 2018-10-10 NOTE — Patient Instructions (Signed)
Medication Instructions:  Your physician recommends that you continue on your current medications as directed. Please refer to the Current Medication list given to you today.  If you need a refill on your cardiac medications before your next appointment, please call your pharmacy.   Lab work: NONE   If you have labs (blood work) drawn today and your tests are completely normal, you will receive your results only by: . MyChart Message (if you have MyChart) OR . A paper copy in the mail If you have any lab test that is abnormal or we need to change your treatment, we will call you to review the results.  Testing/Procedures: NONE   Follow-Up: At [facility], you and your health needs are our priority.  As part of our continuing mission to provide you with exceptional heart care, we have created designated Provider Care Teams.  These Care Teams include your primary Cardiologist (physician) and Advanced Practice Providers (APPs -  Physician Assistants and Nurse Practitioners) who all work together to provide you with the care you need, when you need it. You will need a follow up appointment in 6 months.  Please call our office 2 months in advance to schedule this appointment.  You may see No primary care provider on file. or one of the following Advanced Practice Providers on your designated Care Team:   Brittany Strader, PA-C ([place] Office) . Michele Lenze, PA-C ( Office)  Any Other Special Instructions Will Be Listed Below (If Applicable). Thank you for choosing [facility]!     
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
## 2018-10-18 ENCOUNTER — Encounter (INDEPENDENT_AMBULATORY_CARE_PROVIDER_SITE_OTHER): Payer: Self-pay

## 2018-10-18 ENCOUNTER — Other Ambulatory Visit: Payer: Self-pay

## 2018-10-18 ENCOUNTER — Ambulatory Visit (INDEPENDENT_AMBULATORY_CARE_PROVIDER_SITE_OTHER): Payer: Medicare HMO | Admitting: Family Medicine

## 2018-10-18 ENCOUNTER — Encounter: Payer: Self-pay | Admitting: Family Medicine

## 2018-10-18 VITALS — BP 125/88 | HR 89 | Temp 96.9°F | Ht 71.0 in | Wt 222.0 lb

## 2018-10-18 DIAGNOSIS — N529 Male erectile dysfunction, unspecified: Secondary | ICD-10-CM | POA: Diagnosis not present

## 2018-10-18 DIAGNOSIS — F331 Major depressive disorder, recurrent, moderate: Secondary | ICD-10-CM

## 2018-10-18 DIAGNOSIS — J449 Chronic obstructive pulmonary disease, unspecified: Secondary | ICD-10-CM

## 2018-10-18 DIAGNOSIS — K219 Gastro-esophageal reflux disease without esophagitis: Secondary | ICD-10-CM

## 2018-10-18 DIAGNOSIS — R399 Unspecified symptoms and signs involving the genitourinary system: Secondary | ICD-10-CM

## 2018-10-18 DIAGNOSIS — G479 Sleep disorder, unspecified: Secondary | ICD-10-CM

## 2018-10-18 MED ORDER — AMITRIPTYLINE HCL 50 MG PO TABS: 50.0000 mg | ORAL_TABLET | Freq: Every day | ORAL | 1 refills | Status: DC

## 2018-10-18 MED ORDER — PANTOPRAZOLE SODIUM 40 MG PO TBEC: 40.0000 mg | DELAYED_RELEASE_TABLET | Freq: Every day | ORAL | 3 refills | Status: DC

## 2018-10-18 MED ORDER — TRELEGY ELLIPTA 100-62.5-25 MCG/INH IN AEPB: 1.0000 | INHALATION_SPRAY | Freq: Every day | RESPIRATORY_TRACT | 2 refills | Status: DC

## 2018-10-18 MED ORDER — ESCITALOPRAM OXALATE 20 MG PO TABS: 20.0000 mg | ORAL_TABLET | Freq: Every day | ORAL | 3 refills | Status: DC

## 2018-10-18 MED ORDER — BUPROPION HCL ER (XL) 150 MG PO TB24: 150.0000 mg | ORAL_TABLET | Freq: Every day | ORAL | 3 refills | Status: DC

## 2018-10-18 MED ORDER — VENTOLIN HFA 108 (90 BASE) MCG/ACT IN AERS: 2.0000 | INHALATION_SPRAY | Freq: Four times a day (QID) | RESPIRATORY_TRACT | 5 refills | Status: DC | PRN

## 2018-10-18 MED ORDER — VARDENAFIL HCL 20 MG PO TABS: 10.0000 mg | ORAL_TABLET | Freq: Every day | ORAL | 2 refills | Status: DC | PRN
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
## 2018-10-18 NOTE — Patient Instructions (Signed)

## 2018-10-21 NOTE — Progress Notes (Signed)
I have read nurse practitioner Joyce's note and agree with her assessment and plan. Some things to consider with regards to difficulty with sleep and depressive state, could consider switching from Lexapro and amitriptyline to mirtazapine which would tackle both sleep and mood disorder.  Amitriptyline may be an issue as the patient ages.  Is listed amongst the beers list of medications which may have adverse side effects on the elderly.  Again, he is less than age 63 so this is not such an issue now but going forward if it is found that his sleep and depressive symptoms are not well controlled with his current medication regimen and transition over to mirtazapine may be helpful.  Otherwise, excellent note with very detailed assessment and plan.  Guido Comp M. Lajuana Ripple, [facility]
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
## 2018-12-13 ENCOUNTER — Other Ambulatory Visit: Payer: Self-pay

## 2018-12-13 ENCOUNTER — Ambulatory Visit: Payer: Medicare HMO | Admitting: Family Medicine

## 2018-12-16 ENCOUNTER — Encounter: Payer: Self-pay | Admitting: Family Medicine

## 2019-01-16 ENCOUNTER — Telehealth: Payer: Self-pay | Admitting: Family Medicine

## 2019-01-16 NOTE — Telephone Encounter (Signed)
Left message to please call our office to schedule at daughter's number. Patient phone does not work.

## 2019-01-16 NOTE — Telephone Encounter (Signed)
Can we please call patient and see if he will schedule appointment to f/u on COPD and depression? He missed f/u that was scheduled for 12/13/2018.

## 2019-03-17 ENCOUNTER — Other Ambulatory Visit: Payer: Self-pay | Admitting: Family Medicine

## 2019-03-17 DIAGNOSIS — J449 Chronic obstructive pulmonary disease, unspecified: Secondary | ICD-10-CM

## 2019-04-14 ENCOUNTER — Other Ambulatory Visit: Payer: Self-pay | Admitting: Family Medicine

## 2019-04-14 DIAGNOSIS — J449 Chronic obstructive pulmonary disease, unspecified: Secondary | ICD-10-CM

## 2019-05-04 ENCOUNTER — Other Ambulatory Visit: Payer: Self-pay | Admitting: Family Medicine

## 2019-05-04 DIAGNOSIS — J449 Chronic obstructive pulmonary disease, unspecified: Secondary | ICD-10-CM

## 2019-07-15 ENCOUNTER — Encounter: Payer: Self-pay | Admitting: Family Medicine

## 2019-07-15 ENCOUNTER — Ambulatory Visit (INDEPENDENT_AMBULATORY_CARE_PROVIDER_SITE_OTHER): Payer: Medicare HMO | Admitting: Family Medicine

## 2019-07-15 DIAGNOSIS — J449 Chronic obstructive pulmonary disease, unspecified: Secondary | ICD-10-CM

## 2019-07-15 MED ORDER — VENTOLIN HFA 108 (90 BASE) MCG/ACT IN AERS: 2.0000 | INHALATION_SPRAY | Freq: Four times a day (QID) | RESPIRATORY_TRACT | 5 refills | Status: DC | PRN

## 2019-07-15 MED ORDER — BUDESONIDE-FORMOTEROL FUMARATE 160-4.5 MCG/ACT IN AERO: 2.0000 | INHALATION_SPRAY | Freq: Two times a day (BID) | RESPIRATORY_TRACT | 3 refills | Status: DC
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
## 2019-07-16 ENCOUNTER — Other Ambulatory Visit: Payer: Self-pay

## 2019-07-16 ENCOUNTER — Ambulatory Visit (INDEPENDENT_AMBULATORY_CARE_PROVIDER_SITE_OTHER): Payer: Medicare HMO | Admitting: Pharmacist

## 2019-07-16 DIAGNOSIS — J449 Chronic obstructive pulmonary disease, unspecified: Secondary | ICD-10-CM | POA: Diagnosis not present

## 2019-07-16 MED ORDER — BREZTRI AEROSPHERE 160-9-4.8 MCG/ACT IN AERO: 2.0000 | INHALATION_SPRAY | Freq: Two times a day (BID) | RESPIRATORY_TRACT | 3 refills | Status: DC

## 2019-07-16 NOTE — Progress Notes (Signed)
07/16/2019 Name: Kevin Smith MRN: [id number] DOB: 08-Dec-1955  Referred by: Loman Brooklyn, FNP Reason for referral : COPD  HPI Patient presents today to Pharmacy Clinic for COPD.  Past medical history includes HTN, CAD, HLD, tobacco use, EF 45-50%  Number of hospitalizations in past year: 0 Number of COPD exacerbations in past year:   Per PCP notes and patient report: "Patient reports he is having a hard time breathing. He is using his duoneb nebulizer solution 5-6 times a day. He is out of his Ventolin inhaler and is not using the Trelegy inhaler as he reports he kept getting thrush. He is requesting Symbicort back. When he and I met on 10/18/2018 he was using Symbicort and was not well controlled as he was also using the duoneb once daily and Ventolin once to several times a day. He had been on Spiriva in the past and did not want to add this back at the time. "  Respiratory Medications Current: Ventolin PRN (uses 5-6 puffs daily), Trelegy (not using d/t thrush), uses 4-5 duonebs daily Tried in past: Symbicort Patient reports no known adherence challenges  OBJECTIVE Allergies  Allergen Reactions  . Sulfa Antibiotics Rash    Outpatient Encounter Medications as of 07/16/2019  Medication Sig  . acetaminophen (TYLENOL) 325 MG tablet Take 650 mg by mouth every 6 (six) hours as needed.  Marland Kitchen amitriptyline (ELAVIL) 50 MG tablet Take 1 tablet (50 mg total) by mouth at bedtime.  Marland Kitchen aspirin EC 81 MG EC tablet Take 1 tablet (81 mg total) by mouth daily.  Marland Kitchen atorvastatin (LIPITOR) 80 MG tablet TAKE 1 TABLET BY MOUTH ONCE DAILY.  Marland Kitchen Budeson-Glycopyrrol-Formoterol (BREZTRI AEROSPHERE) 160-9-4.8 MCG/ACT AERO Inhale 2 puffs into the lungs in the morning and at bedtime.  . budesonide-formoterol (SYMBICORT) 160-4.5 MCG/ACT inhaler Inhale 2 puffs into the lungs 2 (two) times daily.  Marland Kitchen buPROPion (WELLBUTRIN XL) 150 MG 24 hr tablet Take 1 tablet (150 mg total) by mouth daily.  . clopidogrel (PLAVIX) 75 MG  tablet Take 1 tablet (75 mg total) by mouth daily.  Marland Kitchen escitalopram (LEXAPRO) 20 MG tablet Take 1 tablet (20 mg total) by mouth daily.  Marland Kitchen ipratropium-albuterol (DUONEB) 0.5-2.5 (3) MG/3ML SOLN Take 3 mLs by nebulization 3 (three) times daily.  Marland Kitchen losartan (COZAAR) 25 MG tablet Take 1 tablet (25 mg total) by mouth daily.  . metoprolol succinate (TOPROL-XL) 25 MG 24 hr tablet Take 1 tablet (25 mg total) by mouth daily.  . Multiple Vitamin (THERA) TABS Take 1 tablet by mouth daily.  . nitroGLYCERIN (NITROSTAT) 0.4 MG SL tablet Place 1 tablet (0.4 mg total) under the tongue every 5 (five) minutes x 3 doses as needed for chest pain. (Patient not taking: Reported on 10/18/2018)  . pantoprazole (PROTONIX) 40 MG tablet Take 1 tablet (40 mg total) by mouth daily.  Marland Kitchen rOPINIRole (REQUIP) 1 MG tablet Take 1 mg by mouth at bedtime.   . vardenafil (LEVITRA) 20 MG tablet Take 0.5-1 tablets (10-20 mg total) by mouth daily as needed for erectile dysfunction.  . VENTOLIN HFA 108 (90 Base) MCG/ACT inhaler Inhale 2 puffs into the lungs every 6 (six) hours as needed for wheezing or shortness of breath.   No facility-administered encounter medications on file as of 07/16/2019.     Immunization History  Administered Date(s) Administered  . Tdap 01/06/2014     Eosinophils Most recent blood eosinophil count was 2 cells/microL taken on 09/15/18.   Assessment   1. Inhaler Optimization  Optimal inhaler for patient would be Breztria MDI considering Trelegy DPI was irritating to the back of his throat.  Patient likely has a low PIFr which infers he would respond well to a MDI (unable to complete test in office at this time due to [place]).  Patient reported increased thrush with DPI use (Trelegy).  Patient was counseled on the purpose, proper use, and adverse effects of Breztri/maintenance inhaler.  Instructed patient to rinse mouth with water after using in order to prevent fungal infection.  Patient verbalized understanding.   Reviewed appropriate use of maintenance vs rescue inhalers.  Stressed importance of using maintenance inhaler daily and rescue inhaler only as needed.  Patient verbalized understanding.  Demonstrated proper inhaler technique using Breztri demo inhaler.  Patient able to demonstrate proper inhaler technique using teach back method. Patient was given sample in office today.  His inhaler was primed and able to administer first dose in office without issue.  2. Medication Reconciliation  A drug regimen assessment was performed, including review of allergies, interactions, disease-state management, dosing and immunization history. Medications were reviewed with the patient, including name, instructions, indication, goals of therapy, potential side effects, importance of adherence, and safe use.  Drug interaction(s): n/a  3. Immunizations  Patient is indicated for the influenzae, pneumonia, and shingles vaccinations.  PLAN  START Breztri (2 puffs twice daily)  Continue albuterol rescue inhaler 1-2 puffs q-6 hrs PRN--use ONLY as needed Sample given: WGY#[id number] [id number], EXP 8/22  All questions encouraged and answered.  Instructed patient to reach out with any further questions or concerns.  Thank you for allowing pharmacy to participate in this patient's care.  This appointment required 28 minutes of patient care (this includes precharting, chart review, review of results, face-to-face care, etc.).   Regina Eck, PharmD, BCPS Clinical Pharmacist, [place]  [facility] Phone [phone]

## 2019-07-24 ENCOUNTER — Encounter: Payer: Self-pay | Admitting: Pharmacist

## 2019-08-01 ENCOUNTER — Ambulatory Visit: Payer: Medicare HMO | Admitting: Family Medicine

## 2019-11-18 ENCOUNTER — Other Ambulatory Visit: Payer: Self-pay | Admitting: Family Medicine

## 2019-11-18 ENCOUNTER — Other Ambulatory Visit: Payer: Self-pay | Admitting: Student

## 2019-11-18 DIAGNOSIS — K219 Gastro-esophageal reflux disease without esophagitis: Secondary | ICD-10-CM

## 2019-11-18 DIAGNOSIS — F331 Major depressive disorder, recurrent, moderate: Secondary | ICD-10-CM

## 2019-12-17 ENCOUNTER — Telehealth: Payer: Self-pay | Admitting: Student

## 2019-12-17 ENCOUNTER — Telehealth: Payer: Self-pay

## 2019-12-17 NOTE — Telephone Encounter (Signed)
Pt will call in the morning with the meds that he needs refilled.

## 2019-12-17 NOTE — Telephone Encounter (Signed)
New message     *STAT* If patient is at the pharmacy, call can be transferred to refill team.   1. Which medications need to be refilled? (please list name of each medication and dose if known) all his medications that [place] fills   2. Which pharmacy/location (including street and city if local pharmacy) is medication to be sent to? [facility] [place]  3. Do they need a 30 day or 90 day supply?  30
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
## 2019-12-17 NOTE — Telephone Encounter (Signed)
New message      Patient Consent for Virtual Visit         Kevin Smith has provided verbal consent on 12/17/2019 for a virtual visit (video or telephone).   CONSENT FOR VIRTUAL VISIT FOR:  Kevin Smith  By participating in this virtual visit I agree to the following:  I hereby voluntarily request, consent and authorize [facility] and its employed or contracted physicians, physician assistants, nurse practitioners or other licensed health care professionals (the Practitioner), to provide me with telemedicine health care services (the "Services") as deemed necessary by the treating Practitioner. I acknowledge and consent to receive the Services by the Practitioner via telemedicine. I understand that the telemedicine visit will involve communicating with the Practitioner through live audiovisual communication technology and the disclosure of certain medical information by electronic transmission. I acknowledge that I have been given the opportunity to request an in-person assessment or other available alternative prior to the telemedicine visit and am voluntarily participating in the telemedicine visit.  I understand that I have the right to withhold or withdraw my consent to the use of telemedicine in the course of my care at any time, without affecting my right to future care or treatment, and that the Practitioner or I may terminate the telemedicine visit at any time. I understand that I have the right to inspect all information obtained and/or recorded in the course of the telemedicine visit and may receive copies of available information for a reasonable fee.  I understand that some of the potential risks of receiving the Services via telemedicine include:  Marland Kitchen Delay or interruption in medical evaluation due to technological equipment failure or disruption; . Information transmitted may not be sufficient (e.g. poor resolution of images) to allow for appropriate medical decision making by the  Practitioner; and/or  . In rare instances, security protocols could fail, causing a breach of personal health information.  Furthermore, I acknowledge that it is my responsibility to provide information about my medical history, conditions and care that is complete and accurate to the best of my ability. I acknowledge that Practitioner's advice, recommendations, and/or decision may be based on factors not within their control, such as incomplete or inaccurate data provided by me or distortions of diagnostic images or specimens that may result from electronic transmissions. I understand that the practice of medicine is not an exact science and that Practitioner makes no warranties or guarantees regarding treatment outcomes. I acknowledge that a copy of this consent can be made available to me via my patient portal [facility] MyChart), or I can request a printed copy by calling the office of [facility].    I understand that my insurance will be billed for this visit.   I have read or had this consent read to me. . I understand the contents of this consent, which adequately explains the benefits and risks of the Services being provided via telemedicine.  . I have been provided ample opportunity to ask questions regarding this consent and the Services and have had my questions answered to my satisfaction. . I give my informed consent for the services to be provided through the use of telemedicine in my medical care

## 2019-12-19 ENCOUNTER — Other Ambulatory Visit: Payer: Self-pay | Admitting: Physician Assistant

## 2019-12-19 ENCOUNTER — Telehealth: Payer: Self-pay

## 2019-12-19 DIAGNOSIS — K219 Gastro-esophageal reflux disease without esophagitis: Secondary | ICD-10-CM

## 2019-12-19 DIAGNOSIS — F331 Major depressive disorder, recurrent, moderate: Secondary | ICD-10-CM

## 2019-12-19 MED ORDER — BUPROPION HCL ER (XL) 150 MG PO TB24: 150.0000 mg | ORAL_TABLET | Freq: Every day | ORAL | 0 refills | Status: DC

## 2019-12-19 MED ORDER — ATORVASTATIN CALCIUM 80 MG PO TABS: ORAL_TABLET | ORAL | 1 refills | Status: DC

## 2019-12-19 MED ORDER — PANTOPRAZOLE SODIUM 40 MG PO TBEC: 40.0000 mg | DELAYED_RELEASE_TABLET | Freq: Every day | ORAL | 0 refills | Status: DC

## 2019-12-19 MED ORDER — LOSARTAN POTASSIUM 25 MG PO TABS
25.0000 mg | ORAL_TABLET | Freq: Every day | ORAL | 1 refills | Status: DC
Start: 2019-12-19 — End: 2019-12-22

## 2019-12-19 MED ORDER — CLOPIDOGREL BISULFATE 75 MG PO TABS: 75.0000 mg | ORAL_TABLET | Freq: Every day | ORAL | 1 refills | Status: DC

## 2019-12-19 MED ORDER — METOPROLOL SUCCINATE ER 25 MG PO TB24
25.0000 mg | ORAL_TABLET | Freq: Every day | ORAL | 1 refills | Status: DC
Start: 2019-12-19 — End: 2020-07-14

## 2019-12-19 MED ORDER — ESCITALOPRAM OXALATE 20 MG PO TABS: 20.0000 mg | ORAL_TABLET | Freq: Every day | ORAL | 0 refills | Status: DC

## 2019-12-19 NOTE — Telephone Encounter (Signed)
New message     *STAT* If patient is at the pharmacy, call can be transferred to refill team.   1. Which medications need to be refilled? (please list name of each medication and dose if known) atorvastatin (LIPITOR) 80 MG tablet clopidogrel (PLAVIX) 75 MG tablet losartan (COZAAR) 25 MG tablet metoprolol succinate (TOPROL-XL) 25 MG 24 hr tablet  2. Which pharmacy/location (including street and city if local pharmacy) is medication to be sent to? [facility] [place]  3. Do they need a 30 day or 90 day supply? 90

## 2019-12-19 NOTE — Telephone Encounter (Signed)
Pt called stating that he was out of his medicine and needs [facility] to send in refills. Pt aware of the message on his pill bottles that says "Needs to be seen for more refills" but waited until he ran out to call and schedule an appt. Pt was very disrespectful towards me about it. Explained to pt that [facility] does not have any openings for the rest of the month. Pt was even more disrespectful. Told pt I would put a message in about him needing an appt and refills to see what could be done.

## 2019-12-19 NOTE — Telephone Encounter (Signed)
I am not sure what patient is requesting a refill of, but he still doesn't have an appointment scheduled. I will be out for maternity leave, so he will need to schedule with one of the other providers.

## 2019-12-19 NOTE — Telephone Encounter (Signed)
meds are pantoprazole, wellbutrin and lexapro. I made him appt with Je on 01/02/20

## 2019-12-19 NOTE — Telephone Encounter (Signed)
Refilled as requested  

## 2019-12-19 NOTE — Telephone Encounter (Signed)
I sent refills to get him to the appointment.

## 2019-12-22 ENCOUNTER — Encounter: Payer: Self-pay | Admitting: Physician Assistant

## 2019-12-22 ENCOUNTER — Telehealth (INDEPENDENT_AMBULATORY_CARE_PROVIDER_SITE_OTHER): Payer: Medicare HMO | Admitting: Physician Assistant

## 2019-12-22 ENCOUNTER — Other Ambulatory Visit: Payer: Self-pay

## 2019-12-22 VITALS — Ht 71.0 in | Wt 222.0 lb

## 2019-12-22 DIAGNOSIS — I712 Thoracic aortic aneurysm, without rupture, unspecified: Secondary | ICD-10-CM

## 2019-12-22 DIAGNOSIS — I255 Ischemic cardiomyopathy: Secondary | ICD-10-CM

## 2019-12-22 DIAGNOSIS — I25810 Atherosclerosis of coronary artery bypass graft(s) without angina pectoris: Secondary | ICD-10-CM

## 2019-12-22 DIAGNOSIS — I10 Essential (primary) hypertension: Secondary | ICD-10-CM | POA: Diagnosis not present

## 2019-12-22 DIAGNOSIS — Z7189 Other specified counseling: Secondary | ICD-10-CM

## 2019-12-22 DIAGNOSIS — F1010 Alcohol abuse, uncomplicated: Secondary | ICD-10-CM

## 2019-12-22 DIAGNOSIS — Z720 Tobacco use: Secondary | ICD-10-CM

## 2019-12-22 DIAGNOSIS — E785 Hyperlipidemia, unspecified: Secondary | ICD-10-CM

## 2019-12-22 MED ORDER — LOSARTAN POTASSIUM 50 MG PO TABS: 50.0000 mg | ORAL_TABLET | Freq: Every day | ORAL | 3 refills | Status: DC

## 2019-12-22 NOTE — Addendum Note (Signed)
Addended by: Marlyn Corporal A on: 12/22/2019 03:06 PM   Modules accepted: Orders

## 2019-12-22 NOTE — Patient Instructions (Addendum)
Medication Instructions:  INCREASE Losartan to 50 mg daily  *If you need a refill on your cardiac medications before your next appointment, please call your pharmacy*   Lab Work:  You will need a BMET lab before CT  If you have labs (blood work) drawn today and your tests are completely normal, you will receive your results only by:  MyChart Message (if you have MyChart) OR  A paper copy in the mail If you have any lab test that is abnormal or we need to change your treatment, we will call you to review the results.   Testing/Procedures: Please schedule Chest Ct to check on aneurysm    Follow-Up: At [facility], you and your health needs are our priority.  As part of our continuing mission to provide you with exceptional heart care, we have created designated Provider Care Teams.  These Care Teams include your primary Cardiologist (physician) and Advanced Practice Providers (APPs -  Physician Assistants and Nurse Practitioners) who all work together to provide you with the care you need, when you need it.  We recommend signing up for the patient portal called "MyChart".  Sign up information is provided on this After Visit Summary.  MyChart is used to connect with patients for Virtual Visits (Telemedicine).  Patients are able to view lab/test results, encounter notes, upcoming appointments, etc.  Non-urgent messages can be sent to your provider as well.   To learn more about what you can do with MyChart, go to [email or web address].    Your next appointment:   4 month(s)  The format for your next appointment:   In Person  Provider:   Dina Rich, MD or Randall An, PA-C   Other Instructions We have contacted [employer] regarding help with obtaining a ride to the hospital.

## 2019-12-22 NOTE — Addendum Note (Signed)
Addended by: Marlyn Corporal A on: 12/22/2019 02:17 PM   Modules accepted: Orders

## 2019-12-23 ENCOUNTER — Telehealth: Payer: Self-pay | Admitting: Licensed Clinical Social Worker

## 2019-12-23 NOTE — Telephone Encounter (Signed)
CSW received referral to assist with transportation to [place] on 11/8 to arrive at 345 pm. CSW attempted to reach patient to inform that transport will be arranged the week prior to procedure. Unable to leave message. Lasandra Beech, LCSW, CCSW-MCS [phone]

## 2020-01-02 ENCOUNTER — Encounter: Payer: Self-pay | Admitting: Nurse Practitioner

## 2020-01-02 ENCOUNTER — Other Ambulatory Visit: Payer: Self-pay

## 2020-01-02 ENCOUNTER — Ambulatory Visit (INDEPENDENT_AMBULATORY_CARE_PROVIDER_SITE_OTHER): Payer: Medicare HMO | Admitting: Nurse Practitioner

## 2020-01-02 VITALS — BP 155/94 | HR 66 | Temp 97.6°F | Ht 71.0 in | Wt 218.6 lb

## 2020-01-02 DIAGNOSIS — F331 Major depressive disorder, recurrent, moderate: Secondary | ICD-10-CM

## 2020-01-02 DIAGNOSIS — R300 Dysuria: Secondary | ICD-10-CM | POA: Diagnosis not present

## 2020-01-02 DIAGNOSIS — K2100 Gastro-esophageal reflux disease with esophagitis, without bleeding: Secondary | ICD-10-CM

## 2020-01-02 DIAGNOSIS — J439 Emphysema, unspecified: Secondary | ICD-10-CM

## 2020-01-02 DIAGNOSIS — J449 Chronic obstructive pulmonary disease, unspecified: Secondary | ICD-10-CM | POA: Diagnosis not present

## 2020-01-02 DIAGNOSIS — N529 Male erectile dysfunction, unspecified: Secondary | ICD-10-CM

## 2020-01-02 DIAGNOSIS — K219 Gastro-esophageal reflux disease without esophagitis: Secondary | ICD-10-CM | POA: Insufficient documentation

## 2020-01-02 LAB — URINALYSIS, MICROSCOPIC ONLY
Cast Type: NONE SEEN
Casts: NONE SEEN /lpf
Crystal Type: NONE SEEN
Crystals: NONE SEEN
Trichomonas, UA: NONE SEEN
Yeast, UA: NONE SEEN

## 2020-01-02 MED ORDER — PANTOPRAZOLE SODIUM 40 MG PO TBEC: 40.0000 mg | DELAYED_RELEASE_TABLET | Freq: Every day | ORAL | 1 refills | Status: DC

## 2020-01-02 MED ORDER — AMITRIPTYLINE HCL 50 MG PO TABS: 50.0000 mg | ORAL_TABLET | Freq: Every day | ORAL | 1 refills | Status: DC

## 2020-01-02 MED ORDER — BREZTRI AEROSPHERE 160-9-4.8 MCG/ACT IN AERO: 2.0000 | INHALATION_SPRAY | Freq: Two times a day (BID) | RESPIRATORY_TRACT | 3 refills | Status: DC

## 2020-01-02 MED ORDER — CLOPIDOGREL BISULFATE 75 MG PO TABS: 75.0000 mg | ORAL_TABLET | Freq: Every day | ORAL | 1 refills | Status: DC

## 2020-01-02 MED ORDER — ESCITALOPRAM OXALATE 20 MG PO TABS: 20.0000 mg | ORAL_TABLET | Freq: Every day | ORAL | 1 refills | Status: DC

## 2020-01-02 MED ORDER — VENTOLIN HFA 108 (90 BASE) MCG/ACT IN AERS: 2.0000 | INHALATION_SPRAY | Freq: Four times a day (QID) | RESPIRATORY_TRACT | 5 refills | Status: DC | PRN

## 2020-01-02 MED ORDER — VARDENAFIL HCL 20 MG PO TABS: 10.0000 mg | ORAL_TABLET | Freq: Every day | ORAL | 2 refills | Status: DC | PRN

## 2020-01-02 MED ORDER — ROPINIROLE HCL 1 MG PO TABS: 1.0000 mg | ORAL_TABLET | Freq: Every day | ORAL | 1 refills | Status: DC

## 2020-01-02 MED ORDER — BUPROPION HCL ER (XL) 150 MG PO TB24: 150.0000 mg | ORAL_TABLET | Freq: Every day | ORAL | 1 refills | Status: DC

## 2020-01-02 MED ORDER — ATORVASTATIN CALCIUM 80 MG PO TABS: ORAL_TABLET | ORAL | 1 refills | Status: DC

## 2020-01-02 NOTE — Patient Instructions (Addendum)
Dysuria Dysuria is pain or discomfort while urinating. The pain or discomfort may be felt in the part of your body that drains urine from the bladder (urethra) or in the surrounding tissue of the genitals. The pain may also be felt in the groin area, lower abdomen, or lower back. You may have to urinate frequently or have the sudden feeling that you have to urinate (urgency). Dysuria can affect both men and women, but it is more common in women. Dysuria can be caused by many different things, including:  Urinary tract infection.  Kidney stones or bladder stones.  Certain sexually transmitted infections (STIs), such as chlamydia.  Dehydration.  Inflammation of the tissues of the vagina.  Use of certain medicines.  Use of certain soaps or scented products that cause irritation. Follow these instructions at home: General instructions  Watch your condition for any changes.  Urinate often. Avoid holding urine for long periods of time.  After a bowel movement or urination, women should cleanse from front to back, using each tissue only once.  Urinate after sexual intercourse.  Keep all follow-up visits as told by your health care provider. This is important.  If you had any tests done to find the cause of dysuria, it is up to you to get your test results. Ask your health care provider, or the department that is doing the test, when your results will be ready. Eating and drinking   Drink enough fluid to keep your urine pale yellow.  Avoid caffeine, tea, and alcohol. They can irritate the bladder and make dysuria worse. In men, alcohol may irritate the prostate. Medicines  Take over-the-counter and prescription medicines only as told by your health care provider.  If you were prescribed an antibiotic medicine, take it as told by your health care provider. Do not stop taking the antibiotic even if you start to feel better. Contact a health care provider if:  You have a  fever.  You develop pain in your back or sides.  You have nausea or vomiting.  You have blood in your urine.  You are not urinating as often as you usually do. Get help right away if:  Your pain is severe and not relieved with medicines.  You cannot eat or drink without vomiting.  You are confused.  You have a rapid heartbeat while at rest.  You have shaking or chills.  You feel extremely weak. Summary  Dysuria is pain or discomfort while urinating. Many different conditions can lead to dysuria.  If you have dysuria, you may have to urinate frequently or have the sudden feeling that you have to urinate (urgency).  Watch your condition for any changes. Keep all follow-up visits as told by your health care provider.  Make sure that you urinate often and drink enough fluid to keep your urine pale yellow. This information is not intended to replace advice given to you by your health care provider. Make sure you discuss any questions you have with your health care provider. Document Revised: 02/09/2017 Document Reviewed: 12/14/2016 Elsevier Patient Education  2020 [employer] Generalized Anxiety Disorder, Adult Generalized anxiety disorder (GAD) is a mental health disorder. People with this condition constantly worry about everyday events. Unlike normal anxiety, worry related to GAD is not triggered by a specific event. These worries also do not fade or get better with time. GAD interferes with life functions, including relationships, work, and school. GAD can vary from mild to severe. People with severe GAD can have  intense waves of anxiety with physical symptoms (panic attacks). What are the causes? The exact cause of GAD is not known. What increases the risk? This condition is more likely to develop in:  Women.  People who have a family history of anxiety disorders.  People who are very shy.  People who experience very stressful life events, such as the death of a  loved one.  People who have a very stressful family environment. What are the signs or symptoms? People with GAD often worry excessively about many things in their lives, such as their health and family. They may also be overly concerned about:  Doing well at work.  Being on time.  Natural disasters.  Friendships. Physical symptoms of GAD include:  Fatigue.  Muscle tension or having muscle twitches.  Trembling or feeling shaky.  Being easily startled.  Feeling like your heart is pounding or racing.  Feeling out of breath or like you cannot take a deep breath.  Having trouble falling asleep or staying asleep.  Sweating.  Nausea, diarrhea, or irritable bowel syndrome (IBS).  Headaches.  Trouble concentrating or remembering facts.  Restlessness.  Irritability. How is this diagnosed? Your health care provider can diagnose GAD based on your symptoms and medical history. You will also have a physical exam. The health care provider will ask specific questions about your symptoms, including how severe they are, when they started, and if they come and go. Your health care provider may ask you about your use of alcohol or drugs, including prescription medicines. Your health care provider may refer you to a mental health specialist for further evaluation. Your health care provider will do a thorough examination and may perform additional tests to rule out other possible causes of your symptoms. To be diagnosed with GAD, a person must have anxiety that:  Is out of his or her control.  Affects several different aspects of his or her life, such as work and relationships.  Causes distress that makes him or her unable to take part in normal activities.  Includes at least three physical symptoms of GAD, such as restlessness, fatigue, trouble concentrating, irritability, muscle tension, or sleep problems. Before your health care provider can confirm a diagnosis of GAD, these  symptoms must be present more days than they are not, and they must last for six months or longer. How is this treated? The following therapies are usually used to treat GAD:  Medicine. Antidepressant medicine is usually prescribed for long-term daily control. Antianxiety medicines may be added in severe cases, especially when panic attacks occur.  Talk therapy (psychotherapy). Certain types of talk therapy can be helpful in treating GAD by providing support, education, and guidance. Options include: ? Cognitive behavioral therapy (CBT). People learn coping skills and techniques to ease their anxiety. They learn to identify unrealistic or negative thoughts and behaviors and to replace them with positive ones. ? Acceptance and commitment therapy (ACT). This treatment teaches people how to be mindful as a way to cope with unwanted thoughts and feelings. ? Biofeedback. This process trains you to manage your body's response (physiological response) through breathing techniques and relaxation methods. You will work with a therapist while machines are used to monitor your physical symptoms.  Stress management techniques. These include yoga, meditation, and exercise. A mental health specialist can help determine which treatment is best for you. Some people see improvement with one type of therapy. However, other people require a combination of therapies. Follow these instructions at home:  Take over-the-counter and prescription medicines only as told by your health care provider.  Try to maintain a normal routine.  Try to anticipate stressful situations and allow extra time to manage them.  Practice any stress management or self-calming techniques as taught by your health care provider.  Do not punish yourself for setbacks or for not making progress.  Try to recognize your accomplishments, even if they are small.  Keep all follow-up visits as told by your health care provider. This is  important. Contact a health care provider if:  Your symptoms do not get better.  Your symptoms get worse.  You have signs of depression, such as: ? A persistently sad, cranky, or irritable mood. ? Loss of enjoyment in activities that used to bring you joy. ? Change in weight or eating. ? Changes in sleeping habits. ? Avoiding friends or family members. ? Loss of energy for normal tasks. ? Feelings of guilt or worthlessness. Get help right away if:  You have serious thoughts about hurting yourself or others. If you ever feel like you may hurt yourself or others, or have thoughts about taking your own life, get help right away. You can go to your nearest emergency department or call:  Your local emergency services (911 in the [place]  A suicide crisis helpline, such as the National Suicide Prevention Lifeline at 430-091-7776. This is open 24 hours a day. Summary  Generalized anxiety disorder (GAD) is a mental health disorder that involves worry that is not triggered by a specific event.  People with GAD often worry excessively about many things in their lives, such as their health and family.  GAD may cause physical symptoms such as restlessness, trouble concentrating, sleep problems, frequent sweating, nausea, diarrhea, headaches, and trembling or muscle twitching.  A mental health specialist can help determine which treatment is best for you. Some people see improvement with one type of therapy. However, other people require a combination of therapies. This information is not intended to replace advice given to you by your health care provider. Make sure you discuss any questions you have with your health care provider. Document Revised: 02/09/2017 Document Reviewed: 01/18/2016 Elsevier Patient Education  2020 [employer] Living With Depression Everyone experiences occasional disappointment, sadness, and loss in their lives. When you are feeling down, blue, or sad for at least  2 weeks in a row, it may mean that you have depression. Depression can affect your thoughts and feelings, relationships, daily activities, and physical health. It is caused by changes in the way your brain functions. If you receive a diagnosis of depression, your health care provider will tell you which type of depression you have and what treatment options are available to you. If you are living with depression, there are ways to help you recover from it and also ways to prevent it from coming back. How to cope with lifestyle changes Coping with stress     Stress is your body's reaction to life changes and events, both good and bad. Stressful situations may include:  Getting married.  The death of a spouse.  Losing a job.  Retiring.  Having a baby. Stress can last just a few hours or it can be ongoing. Stress can play a major role in depression, so it is important to learn both how to cope with stress and how to think about it differently. Talk with your health care provider or a counselor if you would like to learn more about stress reduction. He  or she may suggest some stress reduction techniques, such as:  Music therapy. This can include creating music or listening to music. Choose music that you enjoy and that inspires you.  Mindfulness-based meditation. This kind of meditation can be done while sitting or walking. It involves being aware of your normal breaths, rather than trying to control your breathing.  Centering prayer. This is a kind of meditation that involves focusing on a spiritual word or phrase. Choose a word, phrase, or sacred image that is meaningful to you and that brings you peace.  Deep breathing. To do this, expand your stomach and inhale slowly through your nose. Hold your breath for 3-5 seconds, then exhale slowly, allowing your stomach muscles to relax.  Muscle relaxation. This involves intentionally tensing muscles then relaxing them. Choose a stress reduction  technique that fits your lifestyle and personality. Stress reduction techniques take time and practice to develop. Set aside 5-15 minutes a day to do them. Therapists can offer training in these techniques. The training may be covered by some insurance plans. Other things you can do to manage stress include:  Keeping a stress diary. This can help you learn what triggers your stress and ways to control your response.  Understanding what your limits are and saying no to requests or events that lead to a schedule that is too full.  Thinking about how you respond to certain situations. You may not be able to control everything, but you can control how you react.  Adding humor to your life by watching funny films or TV shows.  Making time for activities that help you relax and not feeling guilty about spending your time this way.  Medicines Your health care provider may suggest certain medicines if he or she feels that they will help improve your condition. Avoid using alcohol and other substances that may prevent your medicines from working properly (may interact). It is also important to:  Talk with your pharmacist or health care provider about all the medicines that you take, their possible side effects, and what medicines are safe to take together.  Make it your goal to take part in all treatment decisions (shared decision-making). This includes giving input on the side effects of medicines. It is best if shared decision-making with your health care provider is part of your total treatment plan. If your health care provider prescribes a medicine, you may not notice the full benefits of it for 4-8 weeks. Most people who are treated for depression need to be on medicine for at least 6-12 months after they feel better. If you are taking medicines as part of your treatment, do not stop taking medicines without first talking to your health care provider. You may need to have the medicine slowly decreased  (tapered) over time to decrease the risk of harmful side effects. Relationships Your health care provider may suggest family therapy along with individual therapy and drug therapy. While there may not be family problems that are causing you to feel depressed, it is still important to make sure your family learns as much as they can about your mental health. Having your family's support can help make your treatment successful. How to recognize changes in your condition Everyone has a different response to treatment for depression. Recovery from major depression happens when you have not had signs of major depression for two months. This may mean that you will start to:  Have more interest in doing activities.  Feel less hopeless than you  did 2 months ago.  Have more energy.  Overeat less often, or have better or improving appetite.  Have better concentration. Your health care provider will work with you to decide the next steps in your recovery. It is also important to recognize when your condition is getting worse. Watch for these signs:  Having fatigue or low energy.  Eating too much or too little.  Sleeping too much or too little.  Feeling restless, agitated, or hopeless.  Having trouble concentrating or making decisions.  Having unexplained physical complaints.  Feeling irritable, angry, or aggressive. Get help as soon as you or your family members notice these symptoms coming back. How to get support and help from others How to talk with friends and family members about your condition  Talking to friends and family members about your condition can provide you with one way to get support and guidance. Reach out to trusted friends or family members, explain your symptoms to them, and let them know that you are working with a health care provider to treat your depression. Financial resources Not all insurance plans cover mental health care, so it is important to check with your  insurance carrier. If paying for co-pays or counseling services is a problem, search for a local or county mental health care center. They may be able to offer public mental health care services at low or no cost when you are not able to see a private health care provider. If you are taking medicine for depression, you may be able to get the generic form, which may be less expensive. Some makers of prescription medicines also offer help to patients who cannot afford the medicines they need. Follow these instructions at home:   Get the right amount and quality of sleep.  Cut down on using caffeine, tobacco, alcohol, and other potentially harmful substances.  Try to exercise, such as walking or lifting small weights.  Take over-the-counter and prescription medicines only as told by your health care provider.  Eat a healthy diet that includes plenty of vegetables, fruits, whole grains, low-fat dairy products, and lean protein. Do not eat a lot of foods that are high in solid fats, added sugars, or salt.  Keep all follow-up visits as told by your health care provider. This is important. Contact a health care provider if:  You stop taking your antidepressant medicines, and you have any of these symptoms: ? Nausea. ? Headache. ? Feeling lightheaded. ? Chills and body aches. ? Not being able to sleep (insomnia).  You or your friends and family think your depression is getting worse. Get help right away if:  You have thoughts of hurting yourself or others. If you ever feel like you may hurt yourself or others, or have thoughts about taking your own life, get help right away. You can go to your nearest emergency department or call:  Your local emergency services (911 in the [place]  A suicide crisis helpline, such as the National Suicide Prevention Lifeline at (317) 164-3378. This is open 24-hours a day. Summary  If you are living with depression, there are ways to help you recover from it  and also ways to prevent it from coming back.  Work with your health care team to create a management plan that includes counseling, stress management techniques, and healthy lifestyle habits. This information is not intended to replace advice given to you by your health care provider. Make sure you discuss any questions you have with your health care provider.  Document Revised: 06/21/2018 Document Reviewed: 01/31/2016 Elsevier Patient Education  2020 [employer].
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
## 2020-01-03 LAB — COMPREHENSIVE METABOLIC PANEL
ALT: 22 IU/L (ref 0–44)
AST: 27 IU/L (ref 0–40)
Albumin/Globulin Ratio: 1.4 (ref 1.2–2.2)
Albumin: 4.6 g/dL (ref 3.8–4.8)
Alkaline Phosphatase: 96 IU/L (ref 44–121)
BUN/Creatinine Ratio: 11 (ref 10–24)
BUN: 13 mg/dL (ref 8–27)
Bilirubin Total: 0.9 mg/dL (ref 0.0–1.2)
CO2: 23 mmol/L (ref 20–29)
Calcium: 9.6 mg/dL (ref 8.6–10.2)
Chloride: 98 mmol/L (ref 96–106)
Creatinine, Ser: 1.22 mg/dL (ref 0.76–1.27)
GFR calc Af Amer: 72 mL/min/{1.73_m2} (ref >59)
GFR calc non Af Amer: 63 mL/min/{1.73_m2} (ref >59)
Globulin, Total: 3.2 g/dL (ref 1.5–4.5)
Glucose: 80 mg/dL (ref 65–99)
Potassium: 5.1 mmol/L (ref 3.5–5.2)
Sodium: 140 mmol/L (ref 134–144)
Total Protein: 7.8 g/dL (ref 6.0–8.5)

## 2020-01-03 LAB — CBC WITH DIFFERENTIAL/PLATELET
Basophils Absolute: 0 10*3/uL (ref 0.0–0.2)
Basos: 1 %
EOS (ABSOLUTE): 0.2 10*3/uL (ref 0.0–0.4)
Eos: 2 %
Hematocrit: 49.5 % (ref 37.5–51.0)
Hemoglobin: 17.3 g/dL (ref 13.0–17.7)
Immature Grans (Abs): 0 10*3/uL (ref 0.0–0.1)
Immature Granulocytes: 0 %
Lymphocytes Absolute: 2.1 10*3/uL (ref 0.7–3.1)
Lymphs: 32 %
MCH: 33.7 pg — ABNORMAL HIGH (ref 26.6–33.0)
MCHC: 34.9 g/dL (ref 31.5–35.7)
MCV: 96 fL (ref 79–97)
Monocytes Absolute: 0.6 10*3/uL (ref 0.1–0.9)
Monocytes: 9 %
Neutrophils Absolute: 3.8 10*3/uL (ref 1.4–7.0)
Neutrophils: 56 %
Platelets: 222 10*3/uL (ref 150–450)
RBC: 5.14 x10E6/uL (ref 4.14–5.80)
RDW: 12.5 % (ref 11.6–15.4)
WBC: 6.7 10*3/uL (ref 3.4–10.8)

## 2020-01-03 LAB — LIPID PANEL
Chol/HDL Ratio: 2.9 ratio (ref 0.0–5.0)
Cholesterol, Total: 184 mg/dL (ref 100–199)
HDL: 64 mg/dL (ref >39)
LDL Chol Calc (NIH): 104 mg/dL — ABNORMAL HIGH (ref 0–99)
Triglycerides: 89 mg/dL (ref 0–149)
VLDL Cholesterol Cal: 16 mg/dL (ref 5–40)

## 2020-01-03 MED ORDER — AMITRIPTYLINE HCL 25 MG PO TABS: 25.0000 mg | ORAL_TABLET | Freq: Every day | ORAL | 1 refills | Status: DC

## 2020-01-03 NOTE — Assessment & Plan Note (Signed)
Well-controlled no changes necessary to current medication dose.  Continue diet and exercise as tolerated. Provided education and printed handouts given.

## 2020-01-03 NOTE — Assessment & Plan Note (Signed)
Patient is reporting new onset of dysuria in the last 3 months.  Patient is reporting frequency of urination and pain with urination.  Patient denies any discharge, pelvic pain, nausea or vomiting. Urinalysis completed provided education to patient with printed handouts given.  Labs pending Follow-up with worsening or unresolved symptoms.

## 2020-01-03 NOTE — Assessment & Plan Note (Signed)
Patient is a 64 year old male who presents to clinic for depression follow-up.  Depression is not well managed.  Patient is not currently taking Elavil as prescribed.  Advised patient to seek psychiatric,/counseling.  Patient reports that he has done that in the past and will continue on current medication dose but would like to cut back on his Elavil due to increased somnolence.  Elavil 50 mg changed to 25 mg.    Rx sent to pharmacy.    Follow-up with worsening or unresolved symptoms.

## 2020-01-05 ENCOUNTER — Telehealth: Payer: Self-pay

## 2020-01-05 DIAGNOSIS — R300 Dysuria: Secondary | ICD-10-CM

## 2020-01-05 NOTE — Telephone Encounter (Signed)
No answer, no voicemail.

## 2020-01-06 NOTE — Telephone Encounter (Signed)
Is having symptoms will come by to leave a urine since we could not do the culture will put in future order.

## 2020-01-06 NOTE — Telephone Encounter (Signed)
Patient aware and verbalized understanding. °

## 2020-01-12 ENCOUNTER — Telehealth: Payer: Self-pay

## 2020-01-12 ENCOUNTER — Telehealth: Payer: Self-pay | Admitting: Licensed Clinical Social Worker

## 2020-01-12 NOTE — Telephone Encounter (Signed)
   Kevin Smith DOB: 06-19-1955 MRN: [id number]   Kevin Smith AND RELEASE OF LIABILITY  For purposes of improving physical access to our facilities, Kevin Smith is pleased to partner with third parties to provide Kevin Smith patients or other authorized individuals the option of convenient, on-demand ground transportation services (the [employer]") through use of the technology service that enables users to request on-demand ground transportation from independent third-party providers.  By opting to use and accept these Kevin Smith, I, the undersigned, hereby agree on behalf of myself, and on behalf of any minor child using the Kevin Smith for whom I am the parent or legal guardian, as follows:  1. Kevin Smith provided to me are provided by independent third-party transportation providers who are not Kevin Smith or employees and who are unaffiliated with Kevin Smith. 2. Kevin Smith is neither a transportation carrier nor a common or public carrier. 3. Kevin Smith has no control over the quality or safety of the transportation that occurs as a result of the Kevin Smith. 4. Kevin Smith cannot guarantee that any third-party transportation provider will complete any arranged transportation service. 5. Kevin Smith makes no representation, warranty, or guarantee regarding the reliability, timeliness, quality, safety, suitability, or availability of any of the Transport Services or that they will be error free. 6. I fully understand that traveling by vehicle involves risks and dangers of serious bodily injury, including permanent disability, paralysis, and death. I agree, on behalf of myself and on behalf of any minor child using the Transport Services for whom I am the parent or legal guardian, that the entire risk arising out of my use of the Kevin Smith remains solely with me, to the maximum extent permitted under applicable law. 7. The Kevin Smith  are provided "as is" and "as available." Kevin Smith disclaims all representations and warranties, express, implied or statutory, not expressly set out in these terms, including the implied warranties of merchantability and fitness for a particular purpose. 8. I hereby waive and release Kevin Smith, its agents, employees, officers, directors, representatives, insurers, attorneys, assigns, successors, subsidiaries, and affiliates from any and all past, present, or future claims, demands, liabilities, actions, causes of action, or suits of any kind directly or indirectly arising from acceptance and use of the Kevin Smith. 9. I further waive and release Kevin Smith and its affiliates from all present and future liability and responsibility for any injury or death to persons or damages to property caused by or related to the use of the Kevin Smith. 10. I have read this Smith and Release of Liability, and I understand the terms used in it and their legal significance. This Smith is freely and voluntarily given with the understanding that my right (as well as the right of any minor child for whom I am the parent or legal guardian using the Kevin Smith) to legal recourse against Kevin Smith in connection with the Kevin Smith is knowingly surrendered in return for use of these services.   I attest that I read the consent document to Kevin Smith, gave Kevin Smith the opportunity to ask questions and answered the questions asked (if any). I affirm that Kevin Smith then provided consent for he's participation in this program.     Kevin Smith

## 2020-01-12 NOTE — Progress Notes (Signed)
CSW received referral to assist patient with transport needs to medical appointments. CSW sent referral for new enrollment to [employer]. Patient provided follow up number and verbalizes understanding of process. CSW available if needed further. Lasandra Beech, LCSW, CCSW-MCS [phone]

## 2020-01-19 ENCOUNTER — Other Ambulatory Visit: Payer: Self-pay

## 2020-01-19 ENCOUNTER — Ambulatory Visit (HOSPITAL_COMMUNITY)
Admission: RE | Admit: 2020-01-19 | Discharge: 2020-01-19 | Disposition: A | Discharge status: Home / Self Care | Payer: Medicare HMO | Source: Ambulatory Visit | Attending: Physician Assistant | Admitting: Physician Assistant

## 2020-01-19 DIAGNOSIS — I712 Thoracic aortic aneurysm, without rupture, unspecified: Secondary | ICD-10-CM

## 2020-01-19 MED ORDER — IOHEXOL 350 MG/ML SOLN
100.0000 mL | Freq: Once | INTRAVENOUS | Status: AC | PRN
  Administered 2020-01-19: 100 mL via INTRAVENOUS

## 2020-02-02 ENCOUNTER — Other Ambulatory Visit: Payer: Self-pay | Admitting: Nurse Practitioner

## 2020-02-02 DIAGNOSIS — R300 Dysuria: Secondary | ICD-10-CM

## 2020-02-06 ENCOUNTER — Emergency Department (HOSPITAL_COMMUNITY)
Admission: EM | Admit: 2020-02-06 | Discharge: 2020-02-06 | Disposition: A | Discharge status: Home / Self Care | Payer: Medicare HMO | Attending: Emergency Medicine | Admitting: Emergency Medicine

## 2020-02-06 ENCOUNTER — Encounter (HOSPITAL_COMMUNITY): Payer: Self-pay

## 2020-02-06 ENCOUNTER — Other Ambulatory Visit: Payer: Self-pay

## 2020-02-06 DIAGNOSIS — R059 Cough, unspecified: Secondary | ICD-10-CM | POA: Diagnosis present

## 2020-02-06 DIAGNOSIS — I25709 Atherosclerosis of coronary artery bypass graft(s), unspecified, with unspecified angina pectoris: Secondary | ICD-10-CM | POA: Diagnosis not present

## 2020-02-06 DIAGNOSIS — I110 Hypertensive heart disease with heart failure: Secondary | ICD-10-CM | POA: Insufficient documentation

## 2020-02-06 DIAGNOSIS — Z7982 Long term (current) use of aspirin: Secondary | ICD-10-CM | POA: Diagnosis not present

## 2020-02-06 DIAGNOSIS — Z7951 Long term (current) use of inhaled steroids: Secondary | ICD-10-CM | POA: Diagnosis not present

## 2020-02-06 DIAGNOSIS — Z955 Presence of coronary angioplasty implant and graft: Secondary | ICD-10-CM | POA: Insufficient documentation

## 2020-02-06 DIAGNOSIS — Z79899 Other long term (current) drug therapy: Secondary | ICD-10-CM | POA: Diagnosis not present

## 2020-02-06 DIAGNOSIS — J45909 Unspecified asthma, uncomplicated: Secondary | ICD-10-CM | POA: Insufficient documentation

## 2020-02-06 DIAGNOSIS — Z20822 Contact with and (suspected) exposure to covid-19: Secondary | ICD-10-CM | POA: Diagnosis not present

## 2020-02-06 DIAGNOSIS — F17210 Nicotine dependence, cigarettes, uncomplicated: Secondary | ICD-10-CM | POA: Insufficient documentation

## 2020-02-06 DIAGNOSIS — J449 Chronic obstructive pulmonary disease, unspecified: Secondary | ICD-10-CM | POA: Insufficient documentation

## 2020-02-06 DIAGNOSIS — Z951 Presence of aortocoronary bypass graft: Secondary | ICD-10-CM | POA: Insufficient documentation

## 2020-02-06 DIAGNOSIS — I5023 Acute on chronic systolic (congestive) heart failure: Secondary | ICD-10-CM | POA: Insufficient documentation

## 2020-02-06 DIAGNOSIS — J0110 Acute frontal sinusitis, unspecified: Secondary | ICD-10-CM | POA: Diagnosis not present

## 2020-02-06 LAB — RESP PANEL BY RT-PCR (FLU A&B, COVID) ARPGX2
Influenza A by PCR: NEGATIVE
Influenza B by PCR: NEGATIVE
SARS Coronavirus 2 by RT PCR: NEGATIVE

## 2020-02-06 MED ORDER — AMOXICILLIN-POT CLAVULANATE 875-125 MG PO TABS: 1.0000 | ORAL_TABLET | Freq: Two times a day (BID) | ORAL | 0 refills | Status: DC

## 2020-02-06 MED ORDER — FLUTICASONE PROPIONATE 50 MCG/ACT NA SUSP: 1.0000 | Freq: Every day | NASAL | 0 refills | Status: DC

## 2020-02-06 NOTE — ED Triage Notes (Signed)
Pt to er, pt states that he is here for general weakness, cough and sneezing and cold like symptoms.  Pt states that he has been feeling ill around Tuesday,.

## 2020-02-06 NOTE — Discharge Instructions (Signed)
You have tested negative for Covid and the flu tonight.  Your exam suggest that you have a sinus infection.  Take the entire course of the antibiotics prescribed.  Increase your fluid intake as discussed.  You may take Tylenol if needed for headache or development of any fever.  Mucinex can also help with congestion.  You have also been prescribed a nasal spray to help open up your nose and sinus passages.
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
## 2020-03-19 ENCOUNTER — Encounter: Payer: Medicare HMO | Admitting: Family Medicine

## 2020-03-19 ENCOUNTER — Encounter: Payer: Self-pay | Admitting: Family Medicine

## 2020-03-19 NOTE — Progress Notes (Signed)
No answer 5:33 Voice mail not active No answer 5:43  No Answer 5:52  Erroneous visit.

## 2020-03-26 ENCOUNTER — Encounter: Payer: Self-pay | Admitting: Family Medicine

## 2020-03-31 ENCOUNTER — Observation Stay (HOSPITAL_COMMUNITY)
Admission: EM | Admit: 2020-03-31 | Discharge: 2020-04-02 | Disposition: A | Discharge status: Home / Self Care | Payer: Medicare HMO | Attending: Family Medicine | Admitting: Family Medicine

## 2020-03-31 ENCOUNTER — Emergency Department (HOSPITAL_COMMUNITY): Payer: Medicare HMO

## 2020-03-31 ENCOUNTER — Other Ambulatory Visit: Payer: Self-pay

## 2020-03-31 ENCOUNTER — Encounter (HOSPITAL_COMMUNITY): Payer: Self-pay

## 2020-03-31 DIAGNOSIS — J90 Pleural effusion, not elsewhere classified: Secondary | ICD-10-CM

## 2020-03-31 DIAGNOSIS — F418 Other specified anxiety disorders: Secondary | ICD-10-CM | POA: Diagnosis present

## 2020-03-31 DIAGNOSIS — I5043 Acute on chronic combined systolic (congestive) and diastolic (congestive) heart failure: Secondary | ICD-10-CM | POA: Diagnosis not present

## 2020-03-31 DIAGNOSIS — Z951 Presence of aortocoronary bypass graft: Secondary | ICD-10-CM | POA: Diagnosis not present

## 2020-03-31 DIAGNOSIS — I2510 Atherosclerotic heart disease of native coronary artery without angina pectoris: Secondary | ICD-10-CM | POA: Insufficient documentation

## 2020-03-31 DIAGNOSIS — U071 COVID-19: Secondary | ICD-10-CM | POA: Diagnosis not present

## 2020-03-31 DIAGNOSIS — I110 Hypertensive heart disease with heart failure: Secondary | ICD-10-CM | POA: Insufficient documentation

## 2020-03-31 DIAGNOSIS — I10 Essential (primary) hypertension: Secondary | ICD-10-CM | POA: Diagnosis present

## 2020-03-31 DIAGNOSIS — Z79899 Other long term (current) drug therapy: Secondary | ICD-10-CM | POA: Insufficient documentation

## 2020-03-31 DIAGNOSIS — Z7982 Long term (current) use of aspirin: Secondary | ICD-10-CM | POA: Diagnosis not present

## 2020-03-31 DIAGNOSIS — J45909 Unspecified asthma, uncomplicated: Secondary | ICD-10-CM | POA: Insufficient documentation

## 2020-03-31 DIAGNOSIS — J9621 Acute and chronic respiratory failure with hypoxia: Secondary | ICD-10-CM | POA: Diagnosis not present

## 2020-03-31 DIAGNOSIS — R0602 Shortness of breath: Secondary | ICD-10-CM | POA: Diagnosis present

## 2020-03-31 DIAGNOSIS — J9601 Acute respiratory failure with hypoxia: Secondary | ICD-10-CM | POA: Diagnosis present

## 2020-03-31 DIAGNOSIS — J449 Chronic obstructive pulmonary disease, unspecified: Secondary | ICD-10-CM | POA: Diagnosis not present

## 2020-03-31 DIAGNOSIS — I509 Heart failure, unspecified: Secondary | ICD-10-CM

## 2020-03-31 DIAGNOSIS — R7989 Other specified abnormal findings of blood chemistry: Secondary | ICD-10-CM | POA: Diagnosis present

## 2020-03-31 DIAGNOSIS — E785 Hyperlipidemia, unspecified: Secondary | ICD-10-CM | POA: Diagnosis present

## 2020-03-31 DIAGNOSIS — I5042 Chronic combined systolic (congestive) and diastolic (congestive) heart failure: Secondary | ICD-10-CM | POA: Diagnosis present

## 2020-03-31 DIAGNOSIS — Z7902 Long term (current) use of antithrombotics/antiplatelets: Secondary | ICD-10-CM | POA: Diagnosis not present

## 2020-03-31 DIAGNOSIS — D7589 Other specified diseases of blood and blood-forming organs: Secondary | ICD-10-CM | POA: Diagnosis present

## 2020-03-31 DIAGNOSIS — J441 Chronic obstructive pulmonary disease with (acute) exacerbation: Secondary | ICD-10-CM | POA: Diagnosis present

## 2020-03-31 DIAGNOSIS — F17210 Nicotine dependence, cigarettes, uncomplicated: Secondary | ICD-10-CM | POA: Insufficient documentation

## 2020-03-31 LAB — CBC WITH DIFFERENTIAL/PLATELET
Abs Immature Granulocytes: 0.02 10*3/uL (ref 0.00–0.07)
Basophils Absolute: 0 10*3/uL (ref 0.0–0.1)
Basophils Relative: 1 %
Eosinophils Absolute: 0.2 10*3/uL (ref 0.0–0.5)
Eosinophils Relative: 3 %
HCT: 44.7 % (ref 39.0–52.0)
Hemoglobin: 14.2 g/dL (ref 13.0–17.0)
Immature Granulocytes: 0 %
Lymphocytes Relative: 28 %
Lymphs Abs: 2.2 10*3/uL (ref 0.7–4.0)
MCH: 33.1 pg (ref 26.0–34.0)
MCHC: 31.8 g/dL (ref 30.0–36.0)
MCV: 104.2 fL — ABNORMAL HIGH (ref 80.0–100.0)
Monocytes Absolute: 0.6 10*3/uL (ref 0.1–1.0)
Monocytes Relative: 7 %
Neutro Abs: 4.8 10*3/uL (ref 1.7–7.7)
Neutrophils Relative %: 61 %
Platelets: 244 10*3/uL (ref 150–400)
RBC: 4.29 MIL/uL (ref 4.22–5.81)
RDW: 14.5 % (ref 11.5–15.5)
WBC: 7.8 10*3/uL (ref 4.0–10.5)
nRBC: 0 % (ref 0.0–0.2)

## 2020-03-31 LAB — COMPREHENSIVE METABOLIC PANEL
ALT: 25 U/L (ref 0–44)
AST: 27 U/L (ref 15–41)
Albumin: 4.2 g/dL (ref 3.5–5.0)
Alkaline Phosphatase: 70 U/L (ref 38–126)
Anion gap: 11 (ref 5–15)
BUN: 22 mg/dL (ref 8–23)
CO2: 25 mmol/L (ref 22–32)
Calcium: 9.2 mg/dL (ref 8.9–10.3)
Chloride: 102 mmol/L (ref 98–111)
Creatinine, Ser: 1.57 mg/dL — ABNORMAL HIGH (ref 0.61–1.24)
GFR, Estimated: 49 mL/min — ABNORMAL LOW (ref >60)
Glucose, Bld: 88 mg/dL (ref 70–99)
Potassium: 4.5 mmol/L (ref 3.5–5.1)
Sodium: 138 mmol/L (ref 135–145)
Total Bilirubin: 1.1 mg/dL (ref 0.3–1.2)
Total Protein: 7.8 g/dL (ref 6.5–8.1)

## 2020-03-31 LAB — BRAIN NATRIURETIC PEPTIDE: B Natriuretic Peptide: 1400 pg/mL — ABNORMAL HIGH (ref 0.0–100.0)

## 2020-03-31 LAB — RESP PANEL BY RT-PCR (FLU A&B, COVID) ARPGX2
Influenza A by PCR: NEGATIVE
Influenza B by PCR: NEGATIVE
SARS Coronavirus 2 by RT PCR: POSITIVE — AB

## 2020-03-31 MED ORDER — ENOXAPARIN SODIUM 40 MG/0.4ML ~~LOC~~ SOLN
40.0000 mg | SUBCUTANEOUS | Status: DC
  Administered 2020-04-01 – 2020-04-02 (×2): 40 mg via SUBCUTANEOUS
  Filled 2020-03-31 (×2): qty 0.4

## 2020-03-31 MED ORDER — ONDANSETRON HCL 4 MG/2ML IJ SOLN: 4.0000 mg | Freq: Four times a day (QID) | INTRAMUSCULAR | Status: DC | PRN

## 2020-03-31 MED ORDER — FUROSEMIDE 10 MG/ML IJ SOLN
40.0000 mg | Freq: Two times a day (BID) | INTRAMUSCULAR | Status: DC
  Administered 2020-04-01 (×2): 40 mg via INTRAVENOUS
  Filled 2020-03-31 (×2): qty 4

## 2020-03-31 MED ORDER — ACETAMINOPHEN 325 MG PO TABS
650.0000 mg | ORAL_TABLET | Freq: Four times a day (QID) | ORAL | Status: DC | PRN
  Administered 2020-04-01: 650 mg via ORAL
  Filled 2020-03-31: qty 2

## 2020-03-31 MED ORDER — FUROSEMIDE 10 MG/ML IJ SOLN
40.0000 mg | Freq: Once | INTRAMUSCULAR | Status: AC
  Administered 2020-03-31: 40 mg via INTRAVENOUS
  Filled 2020-03-31: qty 4

## 2020-03-31 MED ORDER — ACETAMINOPHEN 650 MG RE SUPP
650.0000 mg | Freq: Four times a day (QID) | RECTAL | Status: DC | PRN
  Filled 2020-03-31: qty 1

## 2020-03-31 MED ORDER — ONDANSETRON HCL 4 MG PO TABS
4.0000 mg | ORAL_TABLET | Freq: Four times a day (QID) | ORAL | Status: DC | PRN
  Filled 2020-03-31: qty 1
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
## 2020-03-31 NOTE — ED Triage Notes (Signed)
Pt presents to ED with complaints of SOB and chest congestion since Saturday, denies fever.

## 2020-03-31 NOTE — ED Notes (Signed)
Ambulated pt approx. 20 feet. O2 sat sitting at edge of bed was 97% on room air. Sats dropped between 94-95% during ambulation. Pt with complaints of dizziness, so assisted back to bed. Oxygen recovery back to 98% on room air in bed. Comfort measures provided. Call light w/in reach.

## 2020-04-01 ENCOUNTER — Observation Stay (HOSPITAL_BASED_OUTPATIENT_CLINIC_OR_DEPARTMENT_OTHER): Payer: Medicare HMO

## 2020-04-01 ENCOUNTER — Other Ambulatory Visit: Payer: Self-pay

## 2020-04-01 ENCOUNTER — Observation Stay (HOSPITAL_COMMUNITY): Payer: Medicare HMO

## 2020-04-01 ENCOUNTER — Other Ambulatory Visit (HOSPITAL_COMMUNITY): Payer: Self-pay

## 2020-04-01 DIAGNOSIS — R7989 Other specified abnormal findings of blood chemistry: Secondary | ICD-10-CM

## 2020-04-01 DIAGNOSIS — J9621 Acute and chronic respiratory failure with hypoxia: Secondary | ICD-10-CM | POA: Diagnosis present

## 2020-04-01 DIAGNOSIS — I2510 Atherosclerotic heart disease of native coronary artery without angina pectoris: Secondary | ICD-10-CM

## 2020-04-01 DIAGNOSIS — E785 Hyperlipidemia, unspecified: Secondary | ICD-10-CM

## 2020-04-01 DIAGNOSIS — J441 Chronic obstructive pulmonary disease with (acute) exacerbation: Secondary | ICD-10-CM | POA: Diagnosis not present

## 2020-04-01 DIAGNOSIS — I10 Essential (primary) hypertension: Secondary | ICD-10-CM

## 2020-04-01 DIAGNOSIS — U071 COVID-19: Secondary | ICD-10-CM

## 2020-04-01 DIAGNOSIS — I509 Heart failure, unspecified: Secondary | ICD-10-CM

## 2020-04-01 DIAGNOSIS — I5041 Acute combined systolic (congestive) and diastolic (congestive) heart failure: Secondary | ICD-10-CM

## 2020-04-01 DIAGNOSIS — F418 Other specified anxiety disorders: Secondary | ICD-10-CM | POA: Diagnosis not present

## 2020-04-01 LAB — BASIC METABOLIC PANEL
Anion gap: 10 (ref 5–15)
BUN: 24 mg/dL — ABNORMAL HIGH (ref 8–23)
CO2: 26 mmol/L (ref 22–32)
Calcium: 9.3 mg/dL (ref 8.9–10.3)
Chloride: 100 mmol/L (ref 98–111)
Creatinine, Ser: 1.64 mg/dL — ABNORMAL HIGH (ref 0.61–1.24)
GFR, Estimated: 47 mL/min — ABNORMAL LOW (ref >60)
Glucose, Bld: 125 mg/dL — ABNORMAL HIGH (ref 70–99)
Potassium: 4.7 mmol/L (ref 3.5–5.1)
Sodium: 136 mmol/L (ref 135–145)

## 2020-04-01 LAB — ECHOCARDIOGRAM COMPLETE
Area-P 1/2: 5.7 cm2
Height: 71 in
MV M vel: 4.91 m/s
MV Peak grad: 96.4 mmHg
S' Lateral: 4.48 cm
Weight: 3520 oz

## 2020-04-01 LAB — LACTATE DEHYDROGENASE: LDH: 180 U/L (ref 98–192)

## 2020-04-01 LAB — VITAMIN B12: Vitamin B-12: 227 pg/mL (ref 180–914)

## 2020-04-01 LAB — D-DIMER, QUANTITATIVE: D-Dimer, Quant: 2.68 ug/mL-FEU — ABNORMAL HIGH (ref 0.00–0.50)

## 2020-04-01 LAB — FERRITIN: Ferritin: 178 ng/mL (ref 24–336)

## 2020-04-01 LAB — FIBRINOGEN: Fibrinogen: 463 mg/dL (ref 210–475)

## 2020-04-01 LAB — TROPONIN I (HIGH SENSITIVITY)
Troponin I (High Sensitivity): 14 ng/L (ref <18)
Troponin I (High Sensitivity): 16 ng/L (ref <18)

## 2020-04-01 LAB — PROCALCITONIN: Procalcitonin: <0.1 ng/mL

## 2020-04-01 LAB — FOLATE: Folate: 9.7 ng/mL (ref >5.9)

## 2020-04-01 LAB — HIV ANTIBODY (ROUTINE TESTING W REFLEX): HIV Screen 4th Generation wRfx: NONREACTIVE

## 2020-04-01 LAB — C-REACTIVE PROTEIN: CRP: 2.3 mg/dL — ABNORMAL HIGH (ref <1.0)

## 2020-04-01 MED ORDER — BUDESON-GLYCOPYRROL-FORMOTEROL 160-9-4.8 MCG/ACT IN AERO: 2.0000 | INHALATION_SPRAY | Freq: Every day | RESPIRATORY_TRACT | Status: DC

## 2020-04-01 MED ORDER — METOPROLOL TARTRATE 5 MG/5ML IV SOLN
5.0000 mg | Freq: Once | INTRAVENOUS | Status: AC
  Administered 2020-04-01: 5 mg via INTRAVENOUS
  Filled 2020-04-01: qty 5

## 2020-04-01 MED ORDER — ALBUTEROL SULFATE HFA 108 (90 BASE) MCG/ACT IN AERS
2.0000 | INHALATION_SPRAY | Freq: Four times a day (QID) | RESPIRATORY_TRACT | Status: DC
  Administered 2020-04-01: 2 via RESPIRATORY_TRACT
  Filled 2020-04-01: qty 6.7

## 2020-04-01 MED ORDER — LEVALBUTEROL TARTRATE 45 MCG/ACT IN AERO: 2.0000 | INHALATION_SPRAY | Freq: Four times a day (QID) | RESPIRATORY_TRACT | Status: DC | PRN

## 2020-04-01 MED ORDER — ASPIRIN EC 81 MG PO TBEC
81.0000 mg | DELAYED_RELEASE_TABLET | Freq: Every day | ORAL | Status: DC
  Administered 2020-04-01 – 2020-04-02 (×2): 81 mg via ORAL
  Filled 2020-04-01 (×5): qty 1

## 2020-04-01 MED ORDER — ASPIRIN EC 81 MG PO TBEC
81.0000 mg | DELAYED_RELEASE_TABLET | Freq: Every day | ORAL | Status: DC
  Filled 2020-04-01 (×2): qty 1

## 2020-04-01 MED ORDER — SODIUM CHLORIDE 0.9 % IV SOLN: 100.0000 mg | Freq: Every day | INTRAVENOUS | Status: DC

## 2020-04-01 MED ORDER — UMECLIDINIUM BROMIDE 62.5 MCG/INH IN AEPB
1.0000 | INHALATION_SPRAY | Freq: Every day | RESPIRATORY_TRACT | Status: DC
  Administered 2020-04-01 – 2020-04-02 (×2): 1 via RESPIRATORY_TRACT
  Filled 2020-04-01: qty 7

## 2020-04-01 MED ORDER — SODIUM CHLORIDE FLUSH 0.9 % IV SOLN
INTRAVENOUS | Status: AC
  Filled 2020-04-01: qty 10

## 2020-04-01 MED ORDER — VITAMIN B-12 1000 MCG PO TABS
1000.0000 ug | ORAL_TABLET | Freq: Every day | ORAL | Status: DC
  Administered 2020-04-02: 1000 ug via ORAL
  Filled 2020-04-01 (×4): qty 1

## 2020-04-01 MED ORDER — ATORVASTATIN CALCIUM 40 MG PO TABS
80.0000 mg | ORAL_TABLET | Freq: Every day | ORAL | Status: DC
  Administered 2020-04-01 – 2020-04-02 (×2): 80 mg via ORAL
  Filled 2020-04-01 (×3): qty 1
  Filled 2020-04-01: qty 2
  Filled 2020-04-01: qty 1

## 2020-04-01 MED ORDER — CLOPIDOGREL BISULFATE 75 MG PO TABS
75.0000 mg | ORAL_TABLET | Freq: Every day | ORAL | Status: DC
  Administered 2020-04-01 – 2020-04-02 (×2): 75 mg via ORAL
  Filled 2020-04-01 (×5): qty 1

## 2020-04-01 MED ORDER — MOMETASONE FURO-FORMOTEROL FUM 100-5 MCG/ACT IN AERO
2.0000 | INHALATION_SPRAY | Freq: Two times a day (BID) | RESPIRATORY_TRACT | Status: DC
  Administered 2020-04-01 – 2020-04-02 (×3): 2 via RESPIRATORY_TRACT
  Filled 2020-04-01: qty 8.8

## 2020-04-01 MED ORDER — DEXAMETHASONE SODIUM PHOSPHATE 10 MG/ML IJ SOLN
6.0000 mg | Freq: Every day | INTRAMUSCULAR | Status: DC
  Administered 2020-04-01 (×2): 6 mg via INTRAVENOUS
  Filled 2020-04-01 (×2): qty 0.6
  Filled 2020-04-01: qty 1
  Filled 2020-04-01 (×3): qty 0.6

## 2020-04-01 MED ORDER — ASCORBIC ACID 500 MG PO TABS
500.0000 mg | ORAL_TABLET | Freq: Every day | ORAL | Status: DC
  Administered 2020-04-01 – 2020-04-02 (×2): 500 mg via ORAL
  Filled 2020-04-01 (×5): qty 1

## 2020-04-01 MED ORDER — SODIUM CHLORIDE 0.9 % IV SOLN
100.0000 mg | Freq: Every day | INTRAVENOUS | Status: DC
  Administered 2020-04-02: 100 mg via INTRAVENOUS
  Filled 2020-04-01 (×2): qty 20
  Filled 2020-04-01: qty 100
  Filled 2020-04-01: qty 20

## 2020-04-01 MED ORDER — SODIUM CHLORIDE 0.9 % IV SOLN: 200.0000 mg | Freq: Once | INTRAVENOUS | Status: DC

## 2020-04-01 MED ORDER — LOSARTAN POTASSIUM 50 MG PO TABS
50.0000 mg | ORAL_TABLET | Freq: Every day | ORAL | Status: DC
  Administered 2020-04-01 – 2020-04-02 (×2): 50 mg via ORAL
  Filled 2020-04-01 (×2): qty 1
  Filled 2020-04-01: qty 2
  Filled 2020-04-01 (×2): qty 1

## 2020-04-01 MED ORDER — NITROGLYCERIN 0.4 MG SL SUBL: 0.4000 mg | SUBLINGUAL_TABLET | SUBLINGUAL | Status: DC | PRN

## 2020-04-01 MED ORDER — SODIUM CHLORIDE 0.9 % IV SOLN
100.0000 mg | INTRAVENOUS | Status: AC
  Administered 2020-04-01 (×2): 100 mg via INTRAVENOUS
  Filled 2020-04-01 (×2): qty 20

## 2020-04-01 MED ORDER — PANTOPRAZOLE SODIUM 40 MG PO TBEC
40.0000 mg | DELAYED_RELEASE_TABLET | Freq: Every day | ORAL | Status: DC
  Administered 2020-04-01 – 2020-04-02 (×2): 40 mg via ORAL
  Filled 2020-04-01 (×5): qty 1

## 2020-04-01 MED ORDER — GUAIFENESIN-DM 100-10 MG/5ML PO SYRP
10.0000 mL | ORAL_SOLUTION | ORAL | Status: DC | PRN
  Filled 2020-04-01: qty 10

## 2020-04-01 MED ORDER — HYDROCOD POLST-CPM POLST ER 10-8 MG/5ML PO SUER
5.0000 mL | Freq: Two times a day (BID) | ORAL | Status: DC | PRN
  Administered 2020-04-01 (×2): 5 mL via ORAL
  Filled 2020-04-01 (×3): qty 5

## 2020-04-01 MED ORDER — METOPROLOL SUCCINATE ER 25 MG PO TB24
25.0000 mg | ORAL_TABLET | Freq: Every day | ORAL | Status: DC
  Administered 2020-04-01 – 2020-04-02 (×2): 25 mg via ORAL
  Filled 2020-04-01 (×5): qty 1

## 2020-04-01 MED ORDER — ZINC SULFATE 220 (50 ZN) MG PO CAPS
220.0000 mg | ORAL_CAPSULE | Freq: Every day | ORAL | Status: DC
  Administered 2020-04-01 – 2020-04-02 (×2): 220 mg via ORAL
  Filled 2020-04-01 (×5): qty 1

## 2020-04-01 NOTE — Progress Notes (Signed)
PROGRESS NOTE   Kevin Smith  IPJ:[id number] DOB: Sep 20, 1955 DOA: 03/31/2020 PCP: [name], FNP   Chief Complaint  Patient presents with  . Shortness of Breath    Brief Admission History:  65 y.o. male with medical history significant of CAD, NSTEMI, history of CABG in November 2014, aortic stenosis, aortic aneurysm, hypertension, anxiety, depression, GERD, migraine headaches, hyperlipidemia who is coming to the emergency department due to progressively worsening dyspnea since Saturday associated with orthopnea, PND, fatigue, malaise, decreased appetite, dry cough with right lower chest wall pleuritic pain, but denies chills or fever.  He has had night sweats, but states he has been having them for the past 20 years.  His cough has turned mildly productive of yellowish sputum seems earlier in the day.  He is a smoker, but has not smoked since yesterday.  He has not received any other COVID-vaccine doses.  He denies dietary indiscretions with sodium or fluid intake and mentions that all day the only thing he has only eaten a [employer] cheeseburger his significant other brought him earlier.  He denies palpitations, dizziness or lower extremity edema.  Denies abdominal pain, nausea, vomiting, diarrhea, constipation, melena or hematochezia.  No dysuria, frequency or hematuria.  No polyuria polydipsia, polyphagia or blurry vision.  He was admitted with acute CHF exacerbation and Covid infection.   Assessment & Plan:   Principal Problem:   Acute on chronic respiratory failure with hypoxia (HCC) Active Problems:   Essential hypertension, benign   Coronary artery disease   Hyperlipidemia with target LDL less than 70   S/P CABG x 3   COPD with acute exacerbation (HCC)   Acute on chronic combined systolic and diastolic CHF (congestive heart failure) (HCC)   Anxiety with depression   Elevated serum creatinine   Macrocytosis   COVID-19 virus infection  1. Acute respiratory failure with  hypoxia - secondary to CHF exacerbation and superimposed Covid infection.  He is diuresing on IV lasix.   Continue to monitor I/Os, weights.  Continue supportive measures.   Follow up 2D echocardiogram.  2. CAD s/p CABG - resume home cardiac medications.  3. Essential hypertension - resuming home medications.  4. HLD - resume atorvastatin 80 mg daily.  5. GAD - stable on home meds.  6. AKI - hopefully will improve with diuresis.  Following.     DVT prophylaxis: enoxaparin Code Status: full  Family Communication:  Disposition:   Status is: Observation  The patient remains OBS appropriate and will d/c before 2 midnights.  Dispo: The patient is from: Home              Anticipated d/c is to: Home              Anticipated d/c date is: 1 day              Patient currently is not medically stable to d/c.  Consultants:     Procedures:     Antimicrobials:    Subjective: Pt reports that he is having a lot of shortness of breath.  He is urinating frequently and wants the condom catheter.    Objective: Vitals:   04/01/20 0600 04/01/20 0630 04/01/20 0818 04/01/20 0940  BP: (!) 160/110 (!) 148/107 (!) 161/111   Pulse: 78 76 86   Resp: 15 (!) 28 18   Temp:   97.6 F (36.4 C)   TempSrc:   Oral   SpO2: 97% 94% 100% 93%  Weight:  Height:        Intake/Output Summary (Last 24 hours) at 04/01/2020 1201 Last data filed at 04/01/2020 0322 Gross per 24 hour  Intake 200 ml  Output 1200 ml  Net -1000 ml   Filed Weights   03/31/20 1256  Weight: 99.8 kg    Examination:  General exam: Appears calm and comfortable  Respiratory system: Clear to auscultation. Respiratory effort normal. Cardiovascular system: S1 & S2 heard, RRR. No JVD, murmurs, rubs, gallops or clicks. No pedal edema. Gastrointestinal system: Abdomen is nondistended, soft and nontender. No organomegaly or masses felt. Normal bowel sounds heard. Central nervous system: Alert and oriented. No focal neurological  deficits. Extremities: Symmetric 5 x 5 power. Skin: No rashes, lesions or ulcers Psychiatry: Judgement and insight appear normal. Mood & affect appropriate.   Data Reviewed: I have personally reviewed following labs and imaging studies  CBC: Recent Labs  Lab 03/31/20 1529  WBC 7.8  NEUTROABS 4.8  HGB 14.2  HCT 44.7  MCV 104.2*  PLT 244    Basic Metabolic Panel: Recent Labs  Lab 03/31/20 1529 04/01/20 0759  NA 138 136  K 4.5 4.7  CL 102 100  CO2 25 26  GLUCOSE 88 125*  BUN 22 24*  CREATININE 1.57* 1.64*  CALCIUM 9.2 9.3    GFR: Estimated Creatinine Clearance: 55.5 mL/min (A) (by C-G formula based on SCr of 1.64 mg/dL (H)).  Liver Function Tests: Recent Labs  Lab 03/31/20 1529  AST 27  ALT 25  ALKPHOS 70  BILITOT 1.1  PROT 7.8  ALBUMIN 4.2    CBG: No results for input(s): GLUCAP in the last 168 hours.  Recent Results (from the past 240 hour(s))  Resp Panel by RT-PCR (Flu A&B, Covid) Nasopharyngeal Swab     Status: Abnormal   Collection Time: 03/31/20 10:00 PM   Specimen: Nasopharyngeal Swab; Nasopharyngeal(NP) swabs in vial transport medium  Result Value Ref Range Status   SARS Coronavirus 2 by RT PCR POSITIVE (A) NEGATIVE Final    Comment: RESULT CALLED TO, READ BACK BY AND VERIFIED WITH: K BELTON,RN@[id number]  03/31/20 MKELLY (NOTE) SARS-CoV-2 target nucleic acids are DETECTED.  The SARS-CoV-2 RNA is generally detectable in upper respiratory specimens during the acute phase of infection. Positive results are indicative of the presence of the identified virus, but do not rule out bacterial infection or co-infection with other pathogens not detected by the test. Clinical correlation with patient history and other diagnostic information is necessary to determine patient infection status. The expected result is Negative.  Fact Sheet for Patients: BloggerCourse.com  Fact Sheet for Healthcare  Providers: SeriousBroker.it  This test is not yet approved or cleared by the [place] FDA and  has been authorized for detection and/or diagnosis of SARS-CoV-2 by FDA under an Emergency Use Authorization (EUA).  This EUA will remain in effect (meaning this test can be  used) for the duration of  the COVID-19 declaration under Section 564(b)(1) of the Act, 21 U.S.C. section 360bbb-3(b)(1), unless the authorization is terminated or revoked sooner.     Influenza A by PCR NEGATIVE NEGATIVE Final   Influenza B by PCR NEGATIVE NEGATIVE Final    Comment: (NOTE) The Xpert Xpress SARS-CoV-2/FLU/RSV plus assay is intended as an aid in the diagnosis of influenza from Nasopharyngeal swab specimens and should not be used as a sole basis for treatment. Nasal washings and aspirates are unacceptable for Xpert Xpress SARS-CoV-2/FLU/RSV testing.  Fact Sheet for Patients: BloggerCourse.com  Fact Sheet for Healthcare Providers:  SeriousBroker.it  This test is not yet approved or cleared by the [place] and has been authorized for detection and/or diagnosis of SARS-CoV-2 by FDA under an Emergency Use Authorization (EUA). This EUA will remain in effect (meaning this test can be used) for the duration of the COVID-19 declaration under Section 564(b)(1) of the Act, 21 U.S.C. section 360bbb-3(b)(1), unless the authorization is terminated or revoked.  Performed at [facility], [address] [place], [place] [address]      Radiology Studies: DG Chest 2 View  Result Date: 03/31/2020 CLINICAL DATA:  Shortness of breath and chest congestion. EXAM: CHEST - 2 VIEW COMPARISON:  CT angio chest 01/19/2020 FINDINGS: Cardiac enlargement. Status post median sternotomy and CABG procedure. Small bilateral pleural effusions noted right greater than left. Mild diffuse increase interstitial markings compatible with interstitial  edema. Chronic interstitial changes of emphysema. IMPRESSION: 1. Cardiac enlargement and CHF. 2. Small bilateral pleural effusions, right greater than left. Electronically Signed   By: Signa Kell M.D.   On: 03/31/2020 13:23   DG CHEST PORT 1 VIEW  Result Date: 04/01/2020 CLINICAL DATA:  Shortness of breath and congestion. COVID-19 positive EXAM: PORTABLE CHEST 1 VIEW COMPARISON:  March 31, 2020; May 14, 2017 FINDINGS: There is cardiomegaly with a degree of pulmonary venous hypertension. There is mild interstitial edema as well as chronic central peribronchial thickening. No airspace opacity. There is an equivocal small right pleural effusion. No adenopathy. There is aortic atherosclerosis. Status post coronary artery bypass grafting. IMPRESSION: Cardiomegaly with pulmonary vascular congestion. Stable interstitial edema. Suspect a degree of congestive heart failure. Equivocal right pleural effusion. Suspect a degree of underlying chronic bronchitis. Status post coronary artery bypass grafting. Aortic Atherosclerosis (ICD10-I70.0). Electronically Signed   By: Bretta Bang III M.D.   On: 04/01/2020 08:03   Scheduled Meds: . vitamin C  500 mg Oral Daily  . aspirin EC  81 mg Oral Daily  . atorvastatin  80 mg Oral Daily  . clopidogrel  75 mg Oral Daily  . dexamethasone (DECADRON) injection  6 mg Intravenous QHS  . enoxaparin (LOVENOX) injection  40 mg Subcutaneous Q24H  . furosemide  40 mg Intravenous BID  . losartan  50 mg Oral Daily  . metoprolol succinate  25 mg Oral Daily  . umeclidinium bromide  1 puff Inhalation Daily   And  . mometasone-formoterol  2 puff Inhalation BID  . pantoprazole  40 mg Oral Daily  . [START ON 04/02/2020] vitamin B-12  1,000 mcg Oral Daily  . zinc sulfate  220 mg Oral Daily   Continuous Infusions: . [START ON 04/02/2020] remdesivir 100 mg in NS 100 mL       LOS: 0 days   Time spent: 35 mins   Shayden Gingrich Laural Benes, MD How to contact the [facility] Attending or  Consulting provider 7A - 7P or covering provider during after hours 7P -7A, for this patient?  1. Check the care team in [facility] and look for a) attending/consulting [facility] provider listed and b) the [facility] team listed 2. Log into [email or web address] and use [place]'s universal password to access. If you do not have the password, please contact the hospital operator. 3. Locate the [facility] provider you are looking for under [facility] and page to a number that you can be directly reached. 4. If you still have difficulty reaching the provider, please page the [facility] (Director on Call) for the Hospitalists listed on amion for assistance.  04/01/2020, 12:01 PM

## 2020-04-01 NOTE — Progress Notes (Signed)
Very pleasant 65 year old male, to Endoscopy suites does not complain of pain, SOB slightly because he had a coughing spell prior to arriving to Endoscopy . After assessment 94 percent on 2 liters .

## 2020-04-01 NOTE — Evaluation (Signed)
Physical Therapy Evaluation Patient Details Name: Kevin Smith MRN: [id number] DOB: 05-05-1955 Today's Date: 04/01/2020   History of Present Illness  Kevin Smith is a 65 y.o. male with medical history significant of CAD, NSTEMI, history of CABG in November 2014, aortic stenosis, aortic aneurysm, hypertension, anxiety, depression, GERD, migraine headaches, hyperlipidemia who is coming to the emergency department due to progressively worsening dyspnea since Saturday associated with orthopnea, PND, fatigue, malaise, decreased appetite, dry cough with right lower chest wall pleuritic pain, but denies chills or fever.  He has had night sweats, but states he has been having them for the past 20 years.  His cough has turned mildly productive of yellowish sputum seems earlier in the day.  He is a smoker, but has not smoked since yesterday.  He has not received any other COVID-vaccine doses.  He denies dietary indiscretions with sodium or fluid intake and mentions that all day the only thing he has only eaten a [employer] cheeseburger his significant other brought him earlier.  He denies palpitations, dizziness or lower extremity edema.  Denies abdominal pain, nausea, vomiting, diarrhea, constipation, melena or hematochezia.  No dysuria, frequency or hematuria.  No polyuria polydipsia, polyphagia or blurry vision.    Clinical Impression  Patient functioning near baseline for functional mobility and gait, other than limited for walking in room due to mild SOB and fatigue, on room air with SpO2 at 94% during ambulation, but patient prefers to wear O2 after therapy due to helping him to breath better - RN notified.  Patient will benefit from continued physical therapy in hospital and recommended venue below to increase strength, balance, endurance for safe ADLs and gait.     Follow Up Recommendations Supervision - Intermittent    Equipment Recommendations  None recommended by PT    Recommendations for Other  Services       Precautions / Restrictions Precautions Precautions: None Restrictions Weight Bearing Restrictions: No      Mobility  Bed Mobility Overal bed mobility: Modified Independent                  Transfers Overall transfer level: Modified independent                  Ambulation/Gait Ambulation/Gait assistance: Supervision;Modified independent (Device/Increase time) Gait Distance (Feet): 20 Feet Assistive device: None Gait Pattern/deviations: Decreased step length - right;Decreased step length - left;Decreased stride length Gait velocity: decreased   General Gait Details: slightly labored cadence without loss of balance, limited due to fatigue  Stairs            Wheelchair Mobility    Modified Rankin (Stroke Patients Only)       Balance Overall balance assessment: Mild deficits observed, not formally tested                                           Pertinent Vitals/Pain Pain Assessment: No/denies pain    Home Living Family/patient expects to be discharged to:: Private residence Living Arrangements: Alone Available Help at Discharge: Friend(s);Family;Available PRN/intermittently Type of Home: Apartment Home Access: Stairs to enter Entrance Stairs-Rails: Left Entrance Stairs-Number of Steps: 8 landing +8 = 16 Home Layout: One level Home Equipment: None      Prior Function Level of Independence: Needs assistance   Gait / Transfers Assistance Needed: Community ambulator, does not drive  ADL's / Engineer, water Assistance Needed: [employer]  ADLs assisted by friend        Hand Dominance        Extremity/Trunk Assessment   Upper Extremity Assessment Upper Extremity Assessment: Overall WFL for tasks assessed    Lower Extremity Assessment Lower Extremity Assessment: Overall WFL for tasks assessed    Cervical / Trunk Assessment Cervical / Trunk Assessment: Normal  Communication   Communication: No  difficulties  Cognition Arousal/Alertness: Awake/alert Behavior During Therapy: WFL for tasks assessed/performed Overall Cognitive Status: Within Functional Limits for tasks assessed                                        General Comments      Exercises     Assessment/Plan    PT Assessment Patient needs continued PT services  PT Problem List Decreased strength;Decreased activity tolerance;Decreased balance;Decreased mobility       PT Treatment Interventions DME instruction;Gait training;Stair training;Functional mobility training;Therapeutic activities;Therapeutic exercise;Balance training;Patient/family education    PT Goals (Current goals can be found in the Care Plan section)  Acute Rehab PT Goals Patient Stated Goal: return home able to take care of self PT Goal Formulation: With patient Time For Goal Achievement: 04/08/20 Potential to Achieve Goals: Good    Frequency Min 2X/week   Barriers to discharge        Co-evaluation               AM-PAC PT "6 Clicks" Mobility  Outcome Measure Help needed turning from your back to your side while in a flat bed without using bedrails?: None Help needed moving from lying on your back to sitting on the side of a flat bed without using bedrails?: None Help needed moving to and from a bed to a chair (including a wheelchair)?: None Help needed standing up from a chair using your arms (e.g., wheelchair or bedside chair)?: None Help needed to walk in hospital room?: A Little Help needed climbing 3-5 steps with a railing? : A Little 6 Click Score: 22    End of Session Equipment Utilized During Treatment: Oxygen Activity Tolerance: Patient tolerated treatment well;Patient limited by fatigue Patient left: in bed;with call bell/phone within reach Nurse Communication: Mobility status PT Visit Diagnosis: Unsteadiness on feet (R26.81);Other abnormalities of gait and mobility (R26.89);Muscle weakness (generalized)  (M62.81)    Time: 1540-0867 PT Time Calculation (min) (ACUTE ONLY): 20 min   Charges:   PT Evaluation $PT Eval Moderate Complexity: 1 Mod PT Treatments $Therapeutic Activity: 8-22 mins        4:07 PM, 04/01/20 Ocie Bob, MPT Physical Therapist with [facility] [phone] office [phone] mobile phone

## 2020-04-01 NOTE — Plan of Care (Signed)
  Problem: Acute Rehab PT Goals(only PT should resolve) Goal: Pt Will Go Supine/Side To Sit Outcome: Progressing Flowsheets (Taken 04/01/2020 1608) Pt will go Supine/Side to Sit:  Independently  with modified independence Goal: Patient Will Transfer Sit To/From Stand Outcome: Progressing Flowsheets (Taken 04/01/2020 1608) Patient will transfer sit to/from stand:  Independently  with modified independence Goal: Pt Will Transfer Bed To Chair/Chair To Bed Outcome: Progressing Flowsheets (Taken 04/01/2020 1608) Pt will Transfer Bed to Chair/Chair to Bed:  Independently  with modified independence Goal: Pt Will Ambulate Outcome: Progressing Flowsheets (Taken 04/01/2020 1608) Pt will Ambulate:  50 feet  with modified independence   4:09 PM, 04/01/20 Ocie Bob, MPT Physical Therapist with [facility] [phone] office [phone] mobile phone

## 2020-04-01 NOTE — Progress Notes (Signed)
Instructed patient on IS usage.  Patient was able to do 1750 ml X5 with fairly good patient effort.  Patient could not do no more than 5.  IS is at bedside.

## 2020-04-01 NOTE — TOC Initial Note (Signed)
Transition of Care [facility]) - Initial/Assessment Note    Patient Details  Name: Kevin Smith MRN: [id number] Date of Birth: July 07, 1955  [facility] CM/SW Contact:    Leitha Bleak, RN Phone Number: 04/01/2020, 2:48 PM  Clinical Narrative:    Patient admitted in OBS with acute on chronic respiratory failure. Patient is holding in ED. TOC consulted for CHF. Patient states he lives alone. He does not drives. He tries to make all his appointment, but sometimes does not have transportation. His daughter both work late. Since 2014 he has had triple bypass and 3 stents. He states he was educated on his heart at that time. He does not do daily weight or follow fluid restriction. He has an appointment tomorrow with Cardiology. He is hoping [place] NP will see him in the hospital. Patient advised to ask all of question and get more education on CHF, to do daily weights and things to prevent fluid overload. TOC to follow for needs at discharge, none anticipated.                Expected Discharge Plan: Home w Home Health Services Barriers to Discharge: Continued Medical Work up   Patient Goals and CMS Choice Patient states their goals for this hospitalization and ongoing recovery are:: to go home. CMS Medicare.gov Compare Post Acute Care list provided to:: Patient Choice offered to / list presented to : Patient  Expected Discharge Plan and Services Expected Discharge Plan: Home w Home Health Services       Living arrangements for the past 2 months: Single Family Home      Prior Living Arrangements/Services Living arrangements for the past 2 months: Single Family Home Lives with:: Self          Need for Family Participation in Patient Care: Yes (Comment) Care giver support system in place?: Yes (comment)   Criminal Activity/Legal Involvement Pertinent to Current Situation/Hospitalization: No - Comment as needed  Activities of Daily Living   Permission Sought/Granted    Emotional Assessment     Affect (typically observed): Accepting,Pleasant Orientation: : Oriented to Self,Oriented to Place,Oriented to  Time,Oriented to Situation Alcohol / Substance Use: Not Applicable Psych Involvement: No (comment)  Admission diagnosis:  Acute on chronic combined systolic and diastolic CHF (congestive heart failure) (HCC) [I50.43] Acute exacerbation of CHF (congestive heart failure) (HCC) [I50.9] Patient Active Problem List   Diagnosis Date Noted  . Acute on chronic respiratory failure with hypoxia (HCC) 04/01/2020  . COVID-19 virus infection 04/01/2020  . Acute exacerbation of CHF (congestive heart failure) (HCC) 04/01/2020  . Acute on chronic combined systolic and diastolic CHF (congestive heart failure) (HCC) 03/31/2020  . Anxiety with depression 03/31/2020  . Elevated serum creatinine 03/31/2020  . Macrocytosis 03/31/2020  . Dysuria 01/02/2020  . Gastroesophageal reflux disease 01/02/2020  . Erectile dysfunction 01/02/2020  . COPD with acute exacerbation (HCC) 09/15/2018  . Acute on chronic systolic heart failure (HCC) 03/12/2017  . Coronary artery disease involving 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 (HCC) 03/12/2017  . COPD with chronic bronchitis and emphysema (HCC) 11/22/2015  . Nicotine dependence, uncomplicated 11/22/2015  . Aortic aneurysm (HCC) 2017  . Essential hypertension, benign 01/04/2015  . Coronary artery disease 01/04/2015  . Depression 08/15/2014  . 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 Hyperlipidemia with target LDL less than 70 12/07/2012   PCP:  [name], FNP Pharmacy:   [facility] [address] [place] - [address] [phone]  [address] [place] [address] Phone: [phone] Fax: [phone]  [facility] [address] [place], [place] - [address] [place] [address] Phone: [phone] Fax: [phone]

## 2020-04-01 NOTE — ED Notes (Signed)
Pt denies pain at this time, pt placed on 2L O2 via [place].

## 2020-04-01 NOTE — ED Notes (Signed)
Placed condom catheter on patient.

## 2020-04-01 NOTE — ED Notes (Signed)
Pt heart rate is 120s, md notified, ekg complete and exported

## 2020-04-01 NOTE — Progress Notes (Signed)
*  PRELIMINARY RESULTS* Echocardiogram 2D Echocardiogram has been performed.  Kevin Smith 04/01/2020, 10:00 AM

## 2020-04-02 ENCOUNTER — Ambulatory Visit: Payer: Medicare HMO | Admitting: Student

## 2020-04-02 DIAGNOSIS — J9601 Acute respiratory failure with hypoxia: Secondary | ICD-10-CM

## 2020-04-02 DIAGNOSIS — F418 Other specified anxiety disorders: Secondary | ICD-10-CM | POA: Diagnosis not present

## 2020-04-02 DIAGNOSIS — I5023 Acute on chronic systolic (congestive) heart failure: Secondary | ICD-10-CM | POA: Diagnosis not present

## 2020-04-02 DIAGNOSIS — J9621 Acute and chronic respiratory failure with hypoxia: Secondary | ICD-10-CM | POA: Diagnosis not present

## 2020-04-02 LAB — COMPREHENSIVE METABOLIC PANEL
ALT: 21 U/L (ref 0–44)
AST: 20 U/L (ref 15–41)
Albumin: 3.7 g/dL (ref 3.5–5.0)
Alkaline Phosphatase: 63 U/L (ref 38–126)
Anion gap: 12 (ref 5–15)
BUN: 29 mg/dL — ABNORMAL HIGH (ref 8–23)
CO2: 26 mmol/L (ref 22–32)
Calcium: 9.3 mg/dL (ref 8.9–10.3)
Chloride: 97 mmol/L — ABNORMAL LOW (ref 98–111)
Creatinine, Ser: 1.51 mg/dL — ABNORMAL HIGH (ref 0.61–1.24)
GFR, Estimated: 52 mL/min — ABNORMAL LOW (ref >60)
Glucose, Bld: 123 mg/dL — ABNORMAL HIGH (ref 70–99)
Potassium: 4.1 mmol/L (ref 3.5–5.1)
Sodium: 135 mmol/L (ref 135–145)
Total Bilirubin: 0.7 mg/dL (ref 0.3–1.2)
Total Protein: 7.2 g/dL (ref 6.5–8.1)

## 2020-04-02 LAB — CBC WITH DIFFERENTIAL/PLATELET
Abs Immature Granulocytes: 0.03 10*3/uL (ref 0.00–0.07)
Basophils Absolute: 0 10*3/uL (ref 0.0–0.1)
Basophils Relative: 0 %
Eosinophils Absolute: 0 10*3/uL (ref 0.0–0.5)
Eosinophils Relative: 0 %
HCT: 42.4 % (ref 39.0–52.0)
Hemoglobin: 14.1 g/dL (ref 13.0–17.0)
Immature Granulocytes: 0 %
Lymphocytes Relative: 15 %
Lymphs Abs: 1 10*3/uL (ref 0.7–4.0)
MCH: 32.9 pg (ref 26.0–34.0)
MCHC: 33.3 g/dL (ref 30.0–36.0)
MCV: 99.1 fL (ref 80.0–100.0)
Monocytes Absolute: 0.4 10*3/uL (ref 0.1–1.0)
Monocytes Relative: 6 %
Neutro Abs: 5.4 10*3/uL (ref 1.7–7.7)
Neutrophils Relative %: 79 %
Platelets: 227 10*3/uL (ref 150–400)
RBC: 4.28 MIL/uL (ref 4.22–5.81)
RDW: 14.2 % (ref 11.5–15.5)
WBC: 6.9 10*3/uL (ref 4.0–10.5)
nRBC: 0 % (ref 0.0–0.2)

## 2020-04-02 LAB — FERRITIN: Ferritin: 149 ng/mL (ref 24–336)

## 2020-04-02 LAB — C-REACTIVE PROTEIN: CRP: 1.8 mg/dL — ABNORMAL HIGH (ref <1.0)

## 2020-04-02 LAB — D-DIMER, QUANTITATIVE: D-Dimer, Quant: 1.61 ug/mL-FEU — ABNORMAL HIGH (ref 0.00–0.50)

## 2020-04-02 LAB — MAGNESIUM: Magnesium: 1.6 mg/dL — ABNORMAL LOW (ref 1.7–2.4)

## 2020-04-02 LAB — BRAIN NATRIURETIC PEPTIDE: B Natriuretic Peptide: 570 pg/mL — ABNORMAL HIGH (ref 0.0–100.0)

## 2020-04-02 LAB — PHOSPHORUS: Phosphorus: 4.4 mg/dL (ref 2.5–4.6)

## 2020-04-02 MED ORDER — DEXAMETHASONE 6 MG PO TABS: 6.0000 mg | ORAL_TABLET | Freq: Every day | ORAL | 0 refills | Status: AC

## 2020-04-02 MED ORDER — FUROSEMIDE 40 MG PO TABS: 40.0000 mg | ORAL_TABLET | Freq: Every day | ORAL | 1 refills | Status: DC

## 2020-04-02 MED ORDER — POTASSIUM CHLORIDE ER 10 MEQ PO TBCR: 10.0000 meq | EXTENDED_RELEASE_TABLET | Freq: Every day | ORAL | 1 refills | Status: DC

## 2020-04-02 MED ORDER — GUAIFENESIN-DM 100-10 MG/5ML PO SYRP: 10.0000 mL | ORAL_SOLUTION | ORAL | 0 refills | Status: DC | PRN

## 2020-04-02 MED ORDER — IPRATROPIUM-ALBUTEROL 0.5-2.5 (3) MG/3ML IN SOLN: 3.0000 mL | Freq: Three times a day (TID) | RESPIRATORY_TRACT | Status: DC | PRN

## 2020-04-02 MED ORDER — LOSARTAN POTASSIUM 50 MG PO TABS: 50.0000 mg | ORAL_TABLET | Freq: Every day | ORAL | 0 refills | Status: DC

## 2020-04-02 MED ORDER — ASCORBIC ACID 500 MG PO TABS: 500.0000 mg | ORAL_TABLET | Freq: Every day | ORAL | 0 refills | Status: DC

## 2020-04-02 MED ORDER — ZINC SULFATE 220 (50 ZN) MG PO CAPS: 220.0000 mg | ORAL_CAPSULE | Freq: Every day | ORAL | 0 refills | Status: DC

## 2020-04-02 NOTE — Progress Notes (Signed)
Pt screened for OT services. Pt is performing functional mobility tasks and ADLs at supervision/modified independent level, reports of mild SOB with activity.Pt is at baseline with ADL completion, no further OT services required at this time.   Ezra Sites, OTR/L  [phone] 04/02/20

## 2020-04-02 NOTE — Progress Notes (Signed)
04/02/2020 3:54 PM  Thereasa Distance with [employer] has been working diligently to try to procure oxygen for patient to take home. He was actually on his way to deliver the oxygen to patient when he learned that patient had left the hospital against medical advice and was not able to deliver the oxygen.  We have tried very hard and made every effort to try and get this gentleman his home oxygen.  However, pt decided to leave against medical advice and told staff he had an old oxygen tank at home that he could access if he needed it.    Maryln Manuel MD  How to contact the [facility] Attending or Consulting provider 7A - 7P or covering provider during after hours 7P -7A, for this patient?  1. Check the care team in [facility] and look for a) attending/consulting TRH provider listed and b) the [facility] team listed 2. Log into [email or web address] and use [place]'s universal password to access. If you do not have the password, please contact the hospital operator. 3. Locate the [facility] provider you are looking for under [facility] and page to a number that you can be directly reached. 4. If you still have difficulty reaching the provider, please page the [facility] (Director on Call) for the Hospitalists listed on amion for assistance.

## 2020-04-02 NOTE — OR Nursing (Signed)
Provided patient with fresh cup of ice and coke to drink. Pt denies any further needs.

## 2020-04-02 NOTE — Progress Notes (Signed)
Patient signed AMA form and left without obtaining home O2, as ordered by physician, for his own reasons.

## 2020-04-02 NOTE — Discharge Summary (Signed)
Physician Discharge Summary  Kevin Smith ZOX:[id number] DOB: 04/19/1955 DOA: 03/31/2020  PCP: Gwenlyn FudgeJoyce, Britney F, FNP  Admit date: 03/31/2020 Discharge date: 04/02/2020  Admitted From:  HOME  Disposition:  HOME   Recommendations for Outpatient Follow-up:  1. Follow up with PCP in 2 weeks 2. Follow up with cardiology on 2/14 as scheduled 3. Please check BMP in 1-2 weeks:  Patient scheduled for outpatient Remdesivir infusions at 3:30 on Saturday 1/22, Monday 1/24 and Tuesday 1/25 at [facility]. Please inform the patient to park at[address], [place], as staff will be escorting the patient through the east entrance of the hospital.Appointments take approximately 45 minutes.   There is a wave flag banner located near the entrance on [address] Turn into this entranceand immediatelyturn left or right and park in 1 of the 10 designated Covid Infusion Parking spots. There is a phone number on the sign, please call and let the staff know what spot you are in and we will come out and get you. For questions call [phone]. Thanks.         Home Health:  DME Home Oxygen   Discharge Condition: STABLE   CODE STATUS: FULL    Brief Hospitalization Summary: Please see all hospital notes, images, labs for full details of the hospitalization. ADMISSION HPI: Kevin CaperRoger Mulvihill is a 65 y.o. male with medical history significant of CAD, NSTEMI, history of CABG in November 2014, aortic stenosis, aortic aneurysm, hypertension, anxiety, depression, GERD, migraine headaches, hyperlipidemia who is coming to the emergency department due to progressively worsening dyspnea since Saturday associated with orthopnea, PND, fatigue, malaise, decreased appetite, dry cough with right lower chest wall pleuritic pain, but denies chills or fever.  He has had night sweats, but states he has been having them for the past 20 years.  His cough has turned mildly productive of yellowish sputum seems earlier in the  day.  He is a smoker, but has not smoked since yesterday.  He has not received any other COVID-vaccine doses.  He denies dietary indiscretions with sodium or fluid intake and mentions that all day the only thing he has only eaten a [employer] cheeseburger his significant other brought him earlier.  He denies palpitations, dizziness or lower extremity edema.  Denies abdominal pain, nausea, vomiting, diarrhea, constipation, melena or hematochezia.  No dysuria, frequency or hematuria.  No polyuria polydipsia, polyphagia or blurry vision.  ED Course: Initial vital signs were temperature 98.2 F, pulse 81, respiration 18, BP 150/108 mmHg O2 sat 100% on room air.  The patient received supplemental oxygen and 40 mg of furosemide IVP.  Lab work: His CBC showed a white count of 7.8, hemoglobin 14.2 g/dL with an MCV of 562.1104.2 fL and platelets 244.  CMP showed a creatinine of 1.57 mg/dL and GFR of 49 mL/min, and the rest of the CMP was normal.  Troponin is pending.  BNP was 1400.0 pg/mL. EKG is NSR with known RBBB.  Imaging: A 2 view chest radiograph shows cardiac enlargement and congestive heart failure.  There are small bilateral pleural effusions, right greater than left.    Hospital Course  1. Acute respiratory failure with hypoxia - secondary to acute systolic CHF exacerbation and superimposed Covid infection.  He is diuresing on IV lasix.   Continue to monitor I/Os, weights.  Continue supportive measures.   2D echocardiogram with findings of LVEF 40%.  He has diuresed nearly 4L and is feeling much better.  He would like to go home.  He will discharge on oral lasix 40 mg daily with potassium supplement.  He will discharge on 2L [place] oxygen and home oxygen has been arranged. Pt agreeable to completing final remdesivir infusions at the outpatient remdesivir infusion center and transportation arrangements provided.  2. CAD s/p CABG - resume home cardiac medications.  Pt has outpatient cardiology follow up on 2/14.   3. Essential hypertension - resuming home medications.  4. HLD - resume atorvastatin 80 mg daily.  5. GAD - stable on home meds.  6. AKI on stage 3a CKD - Pt likely has underlying stage 3a CKD. Follow up renal function panel      DVT prophylaxis: enoxaparin Code Status: full  Family Communication:  Disposition: Home   Discharge Diagnoses:  Principal Problem:   Acute on chronic respiratory failure with hypoxia (HCC) Active Problems:   Essential hypertension, benign   Coronary artery disease   Hyperlipidemia with target LDL less than 70   S/P CABG x 3   COPD with acute exacerbation (HCC)   Acute on chronic combined systolic and diastolic CHF (congestive heart failure) (HCC)   Anxiety with depression   Elevated serum creatinine   Macrocytosis   COVID-19 virus infection   Acute exacerbation of CHF (congestive heart failure) (HCC)   Acute respiratory failure with hypoxia [facility])   Discharge Instructions:  Allergies as of 04/02/2020      Reactions   Sulfa Antibiotics Rash      Medication List    STOP taking these medications   amoxicillin-clavulanate 875-125 MG tablet Commonly known as: AUGMENTIN     TAKE these medications   acetaminophen 325 MG tablet Commonly known as: TYLENOL Take 650 mg by mouth every 6 (six) hours as needed.   amitriptyline 25 MG tablet Commonly known as: Elavil Take 1 tablet (25 mg total) by mouth at bedtime.   ascorbic acid 500 MG tablet Commonly known as: VITAMIN C Take 1 tablet (500 mg total) by mouth daily.   aspirin 81 MG EC tablet Take 1 tablet (81 mg total) by mouth daily.   atorvastatin 80 MG tablet Commonly known as: LIPITOR TAKE 1 TABLET BY MOUTH ONCE DAILY.   Breztri Aerosphere 160-9-4.8 MCG/ACT Aero Generic drug: Budeson-Glycopyrrol-Formoterol Inhale 2 puffs into the lungs in the morning and at bedtime.   buPROPion 150 MG 24 hr tablet Commonly known as: WELLBUTRIN XL Take 1 tablet (150 mg total) by mouth daily.    clopidogrel 75 MG tablet Commonly known as: PLAVIX Take 1 tablet (75 mg total) by mouth daily.   dexamethasone 6 MG tablet Commonly known as: DECADRON Take 1 tablet (6 mg total) by mouth daily with breakfast for 8 days.   escitalopram 20 MG tablet Commonly known as: LEXAPRO Take 1 tablet (20 mg total) by mouth daily.   fluticasone 50 MCG/ACT nasal spray Commonly known as: FLONASE Place 1 spray into both nostrils daily.   furosemide 40 MG tablet Commonly known as: Lasix Take 1 tablet (40 mg total) by mouth daily. Start taking on: April 04, 2020   guaiFENesin-dextromethorphan 100-10 MG/5ML syrup Commonly known as: ROBITUSSIN DM Take 10 mLs by mouth every 4 (four) hours as needed for cough.   ipratropium-albuterol 0.5-2.5 (3) MG/3ML Soln Commonly known as: DUONEB Take 3 mLs by nebulization every 8 (eight) hours as needed.   losartan 50 MG tablet Commonly known as: COZAAR Take 1 tablet (50 mg total) by mouth daily.   metoprolol succinate 25 MG 24 hr tablet Commonly known as: TOPROL-XL  Take 1 tablet (25 mg total) by mouth daily.   nitroGLYCERIN 0.4 MG SL tablet Commonly known as: NITROSTAT Place 1 tablet (0.4 mg total) under the tongue every 5 (five) minutes x 3 doses as needed for chest pain.   pantoprazole 40 MG tablet Commonly known as: PROTONIX Take 1 tablet (40 mg total) by mouth daily.   potassium chloride 10 MEQ tablet Commonly known as: KLOR-CON Take 1 tablet (10 mEq total) by mouth daily. Start taking on: April 04, 2020   rOPINIRole 1 MG tablet Commonly known as: REQUIP Take 1 tablet (1 mg total) by mouth at bedtime.   vardenafil 20 MG tablet Commonly known as: Levitra Take 0.5-1 tablets (10-20 mg total) by mouth daily as needed for erectile dysfunction.   Ventolin HFA 108 (90 Base) MCG/ACT inhaler Generic drug: albuterol Inhale 2 puffs into the lungs every 6 (six) hours as needed for wheezing or shortness of breath.   zinc sulfate 220 (50 Zn)  MG capsule Take 1 capsule (220 mg total) by mouth daily.            Durable Medical Equipment  (From admission, onward)         Start     Ordered   04/02/20 1003  For home use only DME oxygen  Once       Question Answer Comment  Length of Need 6 Months   Mode or (Route) Nasal cannula   Liters per Minute 2   Frequency Continuous (stationary and portable oxygen unit needed)   Oxygen conserving device Yes   Oxygen delivery system Gas      04/02/20 1002          Follow-up Information    [name], FNP. Schedule an appointment as soon as possible for a visit in 2 week(s).   Specialty: Family Medicine Why: Hospital Follow Up  Contact information: [address] [place] [address] [phone]        [place], [place]. Schedule an appointment as soon as possible for a visit in 3 week(s).   Specialties: Physician Assistant, Cardiology Why: Hospital Follow Up for CHF / Reschedule next appt due to Covid + status Contact information: [address] [place] [address] [phone]              Allergies  Allergen Reactions  . Sulfa Antibiotics Rash   Allergies as of 04/02/2020      Reactions   Sulfa Antibiotics Rash      Medication List    STOP taking these medications   amoxicillin-clavulanate 875-125 MG tablet Commonly known as: AUGMENTIN     TAKE these medications   acetaminophen 325 MG tablet Commonly known as: TYLENOL Take 650 mg by mouth every 6 (six) hours as needed.   amitriptyline 25 MG tablet Commonly known as: Elavil Take 1 tablet (25 mg total) by mouth at bedtime.   ascorbic acid 500 MG tablet Commonly known as: VITAMIN C Take 1 tablet (500 mg total) by mouth daily.   aspirin 81 MG EC tablet Take 1 tablet (81 mg total) by mouth daily.   atorvastatin 80 MG tablet Commonly known as: LIPITOR TAKE 1 TABLET BY MOUTH ONCE DAILY.   Breztri Aerosphere 160-9-4.8 MCG/ACT Aero Generic drug:  Budeson-Glycopyrrol-Formoterol Inhale 2 puffs into the lungs in the morning and at bedtime.   buPROPion 150 MG 24 hr tablet Commonly known as: WELLBUTRIN XL Take 1 tablet (150 mg total) by mouth daily.   clopidogrel  75 MG tablet Commonly known as: PLAVIX Take 1 tablet (75 mg total) by mouth daily.   dexamethasone 6 MG tablet Commonly known as: DECADRON Take 1 tablet (6 mg total) by mouth daily with breakfast for 8 days.   escitalopram 20 MG tablet Commonly known as: LEXAPRO Take 1 tablet (20 mg total) by mouth daily.   fluticasone 50 MCG/ACT nasal spray Commonly known as: FLONASE Place 1 spray into both nostrils daily.   furosemide 40 MG tablet Commonly known as: Lasix Take 1 tablet (40 mg total) by mouth daily. Start taking on: April 04, 2020   guaiFENesin-dextromethorphan 100-10 MG/5ML syrup Commonly known as: ROBITUSSIN DM Take 10 mLs by mouth every 4 (four) hours as needed for cough.   ipratropium-albuterol 0.5-2.5 (3) MG/3ML Soln Commonly known as: DUONEB Take 3 mLs by nebulization every 8 (eight) hours as needed.   losartan 50 MG tablet Commonly known as: COZAAR Take 1 tablet (50 mg total) by mouth daily.   metoprolol succinate 25 MG 24 hr tablet Commonly known as: TOPROL-XL Take 1 tablet (25 mg total) by mouth daily.   nitroGLYCERIN 0.4 MG SL tablet Commonly known as: NITROSTAT Place 1 tablet (0.4 mg total) under the tongue every 5 (five) minutes x 3 doses as needed for chest pain.   pantoprazole 40 MG tablet Commonly known as: PROTONIX Take 1 tablet (40 mg total) by mouth daily.   potassium chloride 10 MEQ tablet Commonly known as: KLOR-CON Take 1 tablet (10 mEq total) by mouth daily. Start taking on: April 04, 2020   rOPINIRole 1 MG tablet Commonly known as: REQUIP Take 1 tablet (1 mg total) by mouth at bedtime.   vardenafil 20 MG tablet Commonly known as: Levitra Take 0.5-1 tablets (10-20 mg total) by mouth daily as needed for erectile  dysfunction.   Ventolin HFA 108 (90 Base) MCG/ACT inhaler Generic drug: albuterol Inhale 2 puffs into the lungs every 6 (six) hours as needed for wheezing or shortness of breath.   zinc sulfate 220 (50 Zn) MG capsule Take 1 capsule (220 mg total) by mouth daily.            Durable Medical Equipment  (From admission, onward)         Start     Ordered   04/02/20 1003  For home use only DME oxygen  Once       Question Answer Comment  Length of Need 6 Months   Mode or (Route) Nasal cannula   Liters per Minute 2   Frequency Continuous (stationary and portable oxygen unit needed)   Oxygen conserving device Yes   Oxygen delivery system Gas      04/02/20 1002          Procedures/Studies: DG Chest 2 View  Result Date: 03/31/2020 CLINICAL DATA:  Shortness of breath and chest congestion. EXAM: CHEST - 2 VIEW COMPARISON:  CT angio chest 01/19/2020 FINDINGS: Cardiac enlargement. Status post median sternotomy and CABG procedure. Small bilateral pleural effusions noted right greater than left. Mild diffuse increase interstitial markings compatible with interstitial edema. Chronic interstitial changes of emphysema. IMPRESSION: 1. Cardiac enlargement and CHF. 2. Small bilateral pleural effusions, right greater than left. Electronically Signed   By: Signa Kellaylor  Stroud M.D.   On: 03/31/2020 13:23   DG CHEST PORT 1 VIEW  Result Date: 04/01/2020 CLINICAL DATA:  Shortness of breath and congestion. COVID-19 positive EXAM: PORTABLE CHEST 1 VIEW COMPARISON:  March 31, 2020; May 14, 2017 FINDINGS: There is cardiomegaly with  a degree of pulmonary venous hypertension. There is mild interstitial edema as well as chronic central peribronchial thickening. No airspace opacity. There is an equivocal small right pleural effusion. No adenopathy. There is aortic atherosclerosis. Status post coronary artery bypass grafting. IMPRESSION: Cardiomegaly with pulmonary vascular congestion. Stable interstitial edema.  Suspect a degree of congestive heart failure. Equivocal right pleural effusion. Suspect a degree of underlying chronic bronchitis. Status post coronary artery bypass grafting. Aortic Atherosclerosis (ICD10-I70.0). Electronically Signed   By: Bretta Bang III M.D.   On: 04/01/2020 08:03   ECHOCARDIOGRAM COMPLETE  Result Date: 04/01/2020    ECHOCARDIOGRAM REPORT   Patient Name:   KAIDAN HARPSTER Date of Exam: 04/01/2020 Medical Rec #:  [id number]     Height:       71.0 in Accession #:    [id number]    Weight:       220.0 lb Date of Birth:  Mar 05, 1956      BSA:          2.196 m Patient Age:    64 years      BP:           161/111 mmHg Patient Gender: M             HR:           86 bpm. [facility] Procedure: 2D Echo Indications:    CHF-Acute Diastolic I50.31                 CHF-Acute Systolic I50.21  History:        Patient has prior history of Echocardiogram examinations, most                 recent 09/16/2018. CAD, Prior CABG, COPD; Risk                 Factors:Hypertension, Dyslipidemia and Current Smoker. Covid 19.  Sonographer:    Jeryl Columbia RDCS (AE) Referring Phys: [id number] DAVID MANUEL ORTIZ IMPRESSIONS  1. Left ventricular ejection fraction, by estimation, is approximately 40%. The left ventricle has mildly decreased function. The left ventricle demonstrates regional wall motion abnormalities (see scoring diagram/findings for description). The left ventricular internal cavity size was mildly dilated. There is mild left ventricular hypertrophy. Left ventricular diastolic parameters are indeterminate.  2. Right ventricular systolic function is mildly reduced. The right ventricular size is normal. Tricuspid regurgitation signal is inadequate for assessing PA pressure.  3. Left atrial size was moderately dilated.  4. The mitral valve is grossly normal. Trivial mitral valve regurgitation.  5. The aortic valve is tricuspid. There is mild calcification of the aortic valve. Aortic valve  regurgitation is mild.  6. Aortic dilatation noted. There is mild to moderate dilatation of the ascending aorta, measuring 44 mm.  7. The inferior vena cava is normal in size with greater than 50% respiratory variability, suggesting right atrial pressure of 3 mmHg. FINDINGS  Left Ventricle: Left ventricular ejection fraction, by estimation, is 40%. The left ventricle has mildly decreased function. The left ventricle demonstrates regional wall motion abnormalities. The left ventricular internal cavity size was mildly dilated. There is mild left ventricular hypertrophy. Left ventricular diastolic parameters are indeterminate.  LV Wall Scoring: The inferior wall and posterior wall are akinetic. The basal anterolateral segment, apical inferior segment, and basal inferoseptal segment are hypokinetic. The entire anterior wall, entire anterior septum, apical lateral segment, mid anterolateral segment, mid inferoseptal segment, and apex are normal. Right Ventricle: The right ventricular size is  normal. No increase in right ventricular wall thickness. Right ventricular systolic function is mildly reduced. Tricuspid regurgitation signal is inadequate for assessing PA pressure. Left Atrium: Left atrial size was moderately dilated. Right Atrium: Right atrial size was normal in size. Pericardium: There is no evidence of pericardial effusion. Mitral Valve: The mitral valve is grossly normal. Mild mitral annular calcification. Trivial mitral valve regurgitation. Tricuspid Valve: The tricuspid valve is grossly normal. Tricuspid valve regurgitation is trivial. Aortic Valve: The aortic valve is tricuspid. There is mild calcification of the aortic valve. Aortic valve regurgitation is mild. Pulmonic Valve: The pulmonic valve was grossly normal. Pulmonic valve regurgitation is trivial. Aorta: Aortic dilatation noted. There is mild to moderate dilatation of the ascending aorta, measuring 44 mm. Venous: The inferior vena cava is normal in  size with greater than 50% respiratory variability, suggesting right atrial pressure of 3 mmHg. IAS/Shunts: No atrial level shunt detected by color flow Doppler.  LEFT VENTRICLE PLAX 2D LVIDd:         5.70 cm  Diastology LVIDs:         4.48 cm  LV e' medial:    6.09 cm/s LV PW:         1.63 cm  LV E/e' medial:  10.8 LV IVS:        1.19 cm  LV e' lateral:   7.29 cm/s LVOT diam:     2.10 cm  LV E/e' lateral: 9.0 LVOT Area:     3.46 cm  RIGHT VENTRICLE RV S prime:     7.94 cm/s TAPSE (M-mode): 1.9 cm LEFT ATRIUM              Index       RIGHT ATRIUM           Index LA diam:        4.30 cm  1.96 cm/m  RA Area:     22.60 cm LA Vol (A2C):   73.5 ml  33.47 ml/m RA Volume:   67.40 ml  30.70 ml/m LA Vol (A4C):   104.0 ml 47.36 ml/m LA Biplane Vol: 93.3 ml  42.49 ml/m   AORTA Ao Root diam: 3.80 cm MITRAL VALVE MV Area (PHT): 5.70 cm    SHUNTS MV Decel Time: 133 msec    Systemic Diam: 2.10 cm MR Peak grad: 96.4 mmHg MR Vmax:      491.00 cm/s MV E velocity: 65.50 cm/s MV A velocity: 48.00 cm/s MV E/A ratio:  1.36 Nona Dell MD Electronically signed by Nona Dell MD Signature Date/Time: 04/01/2020/12:04:36 PM    Final       Subjective: Pt reports that he feels much better.  He is ambulating well.  No complaints.  He is agreeable to discharge home and outpatient remdesivir infusions.   Discharge Exam: Vitals:   04/01/20 2254 04/02/20 0409  BP: (!) 156/98 (!) 136/102  Pulse:    Resp: 13   Temp: 98.2 F (36.8 C) 97.8 F (36.6 C)  SpO2:  95%   Vitals:   04/01/20 2100 04/01/20 2200 04/01/20 2254 04/02/20 0409  BP: (!) 160/90  (!) 156/98 (!) 136/102  Pulse:      Resp: (!) 22 18 13    Temp:   98.2 F (36.8 C) 97.8 F (36.6 C)  TempSrc:      SpO2:    95%  Weight:      Height:       General: Pt is alert, awake, not in acute distress Cardiovascular: RRR,  S1/S2 +, no rubs, no gallops Respiratory: CTA bilaterally, no wheezing, no rhonchi Abdominal: Soft, NT, ND, bowel sounds + Extremities:  no edema, no cyanosis   The results of significant diagnostics from this hospitalization (including imaging, microbiology, ancillary and laboratory) are listed below for reference.     Microbiology: Recent Results (from the past 240 hour(s))  Resp Panel by RT-PCR (Flu A&B, Covid) Nasopharyngeal Swab     Status: Abnormal   Collection Time: 03/31/20 10:00 PM   Specimen: Nasopharyngeal Swab; Nasopharyngeal(NP) swabs in vial transport medium  Result Value Ref Range Status   SARS Coronavirus 2 by RT PCR POSITIVE (A) NEGATIVE Final    Comment: RESULT CALLED TO, READ BACK BY AND VERIFIED WITH: K BELTON,RN@[id number]  03/31/20 MKELLY (NOTE) SARS-CoV-2 target nucleic acids are DETECTED.  The SARS-CoV-2 RNA is generally detectable in upper respiratory specimens during the acute phase of infection. Positive results are indicative of the presence of the identified virus, but do not rule out bacterial infection or co-infection with other pathogens not detected by the test. Clinical correlation with patient history and other diagnostic information is necessary to determine patient infection status. The expected result is Negative.  Fact Sheet for Patients: BloggerCourse.com  Fact Sheet for Healthcare Providers: SeriousBroker.it  This test is not yet approved or cleared by the [place] FDA and  has been authorized for detection and/or diagnosis of SARS-CoV-2 by FDA under an Emergency Use Authorization (EUA).  This EUA will remain in effect (meaning this test can be  used) for the duration of  the COVID-19 declaration under Section 564(b)(1) of the Act, 21 U.S.C. section 360bbb-3(b)(1), unless the authorization is terminated or revoked sooner.     Influenza A by PCR NEGATIVE NEGATIVE Final   Influenza B by PCR NEGATIVE NEGATIVE Final    Comment: (NOTE) The Xpert Xpress SARS-CoV-2/FLU/RSV plus assay is intended as an aid in the diagnosis of  influenza from Nasopharyngeal swab specimens and should not be used as a sole basis for treatment. Nasal washings and aspirates are unacceptable for Xpert Xpress SARS-CoV-2/FLU/RSV testing.  Fact Sheet for Patients: BloggerCourse.com  Fact Sheet for Healthcare Providers: SeriousBroker.it  This test is not yet approved or cleared by the [place] FDA and has been authorized for detection and/or diagnosis of SARS-CoV-2 by FDA under an Emergency Use Authorization (EUA). This EUA will remain in effect (meaning this test can be used) for the duration of the COVID-19 declaration under Section 564(b)(1) of the Act, 21 U.S.C. section 360bbb-3(b)(1), unless the authorization is terminated or revoked.  Performed at [facility], [address], [place], [place] [address]      Labs: BNP (last 3 results) Recent Labs    03/31/20 1529 04/02/20 0725  BNP 1,400.0* 570.0*   Basic Metabolic Panel: Recent Labs  Lab 03/31/20 1529 04/01/20 0759 04/02/20 0724  NA 138 136 135  K 4.5 4.7 4.1  CL 102 100 97*  CO2 25 26 26   GLUCOSE 88 125* 123*  BUN 22 24* 29*  CREATININE 1.57* 1.64* 1.51*  CALCIUM 9.2 9.3 9.3  MG  --   --  1.6*  PHOS  --   --  4.4   Liver Function Tests: Recent Labs  Lab 03/31/20 1529 04/02/20 0724  AST 27 20  ALT 25 21  ALKPHOS 70 63  BILITOT 1.1 0.7  PROT 7.8 7.2  ALBUMIN 4.2 3.7   No results for input(s): LIPASE, AMYLASE in the last 168 hours. No results for input(s): AMMONIA in  the last 168 hours. CBC: Recent Labs  Lab 03/31/20 1529 04/02/20 0724  WBC 7.8 6.9  NEUTROABS 4.8 5.4  HGB 14.2 14.1  HCT 44.7 42.4  MCV 104.2* 99.1  PLT 244 227   Cardiac Enzymes: No results for input(s): CKTOTAL, CKMB, CKMBINDEX, TROPONINI in the last 168 hours. BNP: Invalid input(s): POCBNP CBG: No results for input(s): GLUCAP in the last 168 hours. D-Dimer Recent Labs    04/01/20 0139 04/02/20 0724  DDIMER  2.68* 1.61*   Hgb A1c No results for input(s): HGBA1C in the last 72 hours. Lipid Profile No results for input(s): CHOL, HDL, LDLCALC, TRIG, CHOLHDL, LDLDIRECT in the last 72 hours. Thyroid function studies No results for input(s): TSH, T4TOTAL, T3FREE, THYROIDAB in the last 72 hours.  Invalid input(s): FREET3 Anemia work up [employer]    03/31/20 2315 04/01/20 0139 04/02/20 0724  VITAMINB12 227  --   --   FOLATE 9.7  --   --   FERRITIN  --  178 149   Urinalysis No results found for: COLORURINE, APPEARANCEUR, LABSPEC, PHURINE, GLUCOSEU, HGBUR, BILIRUBINUR, KETONESUR, PROTEINUR, UROBILINOGEN, NITRITE, LEUKOCYTESUR Sepsis Labs Invalid input(s): PROCALCITONIN,  WBC,  LACTICIDVEN Microbiology Recent Results (from the past 240 hour(s))  Resp Panel by RT-PCR (Flu A&B, Covid) Nasopharyngeal Swab     Status: Abnormal   Collection Time: 03/31/20 10:00 PM   Specimen: Nasopharyngeal Swab; Nasopharyngeal(NP) swabs in vial transport medium  Result Value Ref Range Status   SARS Coronavirus 2 by RT PCR POSITIVE (A) NEGATIVE Final    Comment: RESULT CALLED TO, READ BACK BY AND VERIFIED WITH: K BELTON,[email or web address]  03/31/20 MKELLY (NOTE) SARS-CoV-2 target nucleic acids are DETECTED.  The SARS-CoV-2 RNA is generally detectable in upper respiratory specimens during the acute phase of infection. Positive results are indicative of the presence of the identified virus, but do not rule out bacterial infection or co-infection with other pathogens not detected by the test. Clinical correlation with patient history and other diagnostic information is necessary to determine patient infection status. The expected result is Negative.  Fact Sheet for Patients: BloggerCourse.com  Fact Sheet for Healthcare Providers: SeriousBroker.it  This test is not yet approved or cleared by the [place] FDA and  has been authorized for detection and/or diagnosis  of SARS-CoV-2 by FDA under an Emergency Use Authorization (EUA).  This EUA will remain in effect (meaning this test can be  used) for the duration of  the COVID-19 declaration under Section 564(b)(1) of the Act, 21 U.S.C. section 360bbb-3(b)(1), unless the authorization is terminated or revoked sooner.     Influenza A by PCR NEGATIVE NEGATIVE Final   Influenza B by PCR NEGATIVE NEGATIVE Final    Comment: (NOTE) The Xpert Xpress SARS-CoV-2/FLU/RSV plus assay is intended as an aid in the diagnosis of influenza from Nasopharyngeal swab specimens and should not be used as a sole basis for treatment. Nasal washings and aspirates are unacceptable for Xpert Xpress SARS-CoV-2/FLU/RSV testing.  Fact Sheet for Patients: BloggerCourse.com  Fact Sheet for Healthcare Providers: SeriousBroker.it  This test is not yet approved or cleared by the [place] FDA and has been authorized for detection and/or diagnosis of SARS-CoV-2 by FDA under an Emergency Use Authorization (EUA). This EUA will remain in effect (meaning this test can be used) for the duration of the COVID-19 declaration under Section 564(b)(1) of the Act, 21 U.S.C. section 360bbb-3(b)(1), unless the authorization is terminated or revoked.  Performed at [facility], [address] [place], [place] [address]  Time coordinating discharge: 36 mins  SIGNED:  Standley Dakins, MD  [facility] 04/02/2020, 10:43 AM How to contact the [facility] Attending or Consulting provider 7A - 7P or covering provider during after hours 7P -7A, for this patient?  1. Check the care team in [facility] and look for a) attending/consulting TRH provider listed and b) the [facility] team listed 2. Log into [email or web address] and use [facility]'s universal password to access. If you do not have the password, please contact the hospital operator. 3. Locate the [facility] provider you are looking for under [facility]  and page to a number that you can be directly reached. 4. If you still have difficulty reaching the provider, please page the [facility] (Director on Call) for the Hospitalists listed on amion for assistance.

## 2020-04-02 NOTE — Progress Notes (Signed)
SATURATION QUALIFICATIONS: (This note is used to comply with regulatory documentation for home oxygen)  Patient Saturations on Room Air at Rest = 98%  Patient Saturations on Room Air while Ambulating = 88%  Patient Saturations on 2 Liters of oxygen while Ambulating = 92%  Please briefly explain why patient needs home oxygen: COVID positive with inability to maintain adequate saturation.

## 2020-04-02 NOTE — TOC Progression Note (Signed)
Transition of Care [facility]) - Progression Note    Patient Details  Name: Kevin Smith MRN: [id number] Date of Birth: [birth year]  [facility] CM/SW Contact  Barry Brunner, LCSW Phone Number: 04/02/2020, 4:00 PM  Clinical Narrative:    CSW contacted [employer] with [employer] for patient's O2 needs. [employer] agreeable to provide O2. Patient reported that they were not able to afford the copay. CSW spoke with patient's family to inquire if they were able to assist with patient's copay. Patient's family reported that the were not able to assist. [employer] contacted supervisor to identify any assistance options. CSW contacted supervisor to identify assistance programs. CSW supervisor reported that their were no programs able to assist with a copay. Thereasa Distance reported that they will be able to have patient apply for the hardship program with [employer] with the stipulation that patient will be required to provide banking information to secure the assistance. Patient was informed that he would not be billed when providing his banking information. Patient reported that he did not want to provide his banking information and left AMA. TOC signing off.    Expected Discharge Plan: Home w Home Health Services Barriers to Discharge: Continued Medical Work up  Expected Discharge Plan and Services Expected Discharge Plan: Home w Home Health Services       Living arrangements for the past 2 months: Single Family Home Expected Discharge Date: 04/02/20                                     Social Determinants of Health (SDOH) Interventions    Readmission Risk Interventions No flowsheet data found.

## 2020-04-02 NOTE — Progress Notes (Signed)
Patient scheduled for outpatient Remdesivir infusions at 3:30 on Saturday 1/22, Monday 1/24 and Tuesday 1/25 at [facility]. Please inform the patient to park at [address] [place], [place], as staff will be escorting the patient through the east entrance of the hospital. Appointments take approximately 45 minutes.    There is a wave flag banner located near the entrance on [address]. Turn into this entrance and immediately turn left or right and park in 1 of the 10 designated Covid Infusion Parking spots. There is a phone number on the sign, please call and let the staff know what spot you are in and we will come out and get you. For questions call [phone].  Thanks.

## 2020-04-02 NOTE — Discharge Instructions (Signed)
Patient scheduled for outpatient Remdesivir infusions at 3:30 on Saturday 1/22, Monday 1/24 and Tuesday 1/25 at [facility]. Please inform the patient to park at [address] [place], [place], as staff will be escorting the patient through the east entrance of the hospital. Appointments take approximately 45 minutes.    There is a wave flag banner located near the entrance on [address]. Turn into this entrance and immediately turn left or right and park in 1 of the 10 designated Covid Infusion Parking spots. There is a phone number on the sign, please call and let the staff know what spot you are in and we will come out and get you. For questions call [phone].  Thanks.   IMPORTANT INFORMATION: PAY CLOSE ATTENTION   PHYSICIAN DISCHARGE INSTRUCTIONS  Follow with Primary care provider  [name], FNP  and other consultants as instructed by your Hospitalist Physician  SEEK MEDICAL CARE OR RETURN TO EMERGENCY ROOM IF SYMPTOMS COME BACK, WORSEN OR NEW PROBLEM DEVELOPS   Please note: You were cared for by a hospitalist during your hospital stay. Every effort will be made to forward records to your primary care provider.  You can request that your primary care provider send for your hospital records if they have not received them.  Once you are discharged, your primary care physician will handle any further medical issues. Please note that NO REFILLS for any discharge medications will be authorized once you are discharged, as it is imperative that you return to your primary care physician (or establish a relationship with a primary care physician if you do not have one) for your post hospital discharge needs so that they can reassess your need for medications and monitor your lab values.  Please get a complete blood count and chemistry panel checked by your Primary MD at your next visit, and again as instructed by your Primary MD.  Get Medicines reviewed and adjusted: Please take all  your medications with you for your next visit with your Primary MD  Laboratory/radiological data: Please request your Primary MD to go over all hospital tests and procedure/radiological results at the follow up, please ask your primary care provider to get all Hospital records sent to his/her office.  In some cases, they will be blood work, cultures and biopsy results pending at the time of your discharge. Please request that your primary care provider follow up on these results.  If you are diabetic, please bring your blood sugar readings with you to your follow up appointment with primary care.    Please call and make your follow up appointments as soon as possible.    Also Note the following: If you experience worsening of your admission symptoms, develop shortness of breath, life threatening emergency, suicidal or homicidal thoughts you must seek medical attention immediately by calling 911 or calling your MD immediately  if symptoms less severe.  You must read complete instructions/literature along with all the possible adverse reactions/side effects for all the Medicines you take and that have been prescribed to you. Take any new Medicines after you have completely understood and accpet all the possible adverse reactions/side effects.   Do not drive when taking Pain medications or sleeping medications (Benzodiazepines)  Do not take more than prescribed Pain, Sleep and Anxiety Medications. It is not advisable to combine anxiety,sleep and pain medications without talking with your primary care practitioner  Special Instructions: If you have smoked or chewed Tobacco  in the last 2  yrs please stop smoking, stop any regular Alcohol  and or any Recreational drug use.  Wear Seat belts while driving.  Do not drive if taking any narcotic, mind altering or controlled substances or recreational drugs or alcohol.

## 2020-04-02 NOTE — Care Management Obs Status (Signed)
MEDICARE OBSERVATION STATUS NOTIFICATION   Patient Details  Name: Kevin Smith MRN: [id number] Date of Birth: 06/13/1955   Medicare Observation Status Notification Given:  Yes    Barry Brunner, LCSW 04/02/2020, 10:55 AM

## 2020-04-02 NOTE — Care Management CC44 (Signed)
Condition Code 44 Documentation Completed  Patient Details  Name: Kevin Smith MRN: [id number] Date of Birth: Aug 28, 1955   Condition Code 44 given:  Yes Patient signature on Condition Code 44 notice:  Yes Documentation of 2 MD's agreement:  Yes Code 44 added to claim:  Yes    Barry Brunner, LCSW 04/02/2020, 10:55 AM

## 2020-04-02 NOTE — OR Nursing (Signed)
Provided patient with peanut butter crackers. Denies any further needs at this time.

## 2020-04-03 ENCOUNTER — Inpatient Hospital Stay (HOSPITAL_COMMUNITY): Admit: 2020-04-03 | Payer: Medicare HMO

## 2020-04-05 ENCOUNTER — Ambulatory Visit (HOSPITAL_COMMUNITY)
Admission: RE | Admit: 2020-04-05 | Discharge: 2020-04-05 | Disposition: A | Discharge status: Home / Self Care | Payer: Medicare HMO | Source: Ambulatory Visit | Attending: Pulmonary Disease | Admitting: Pulmonary Disease

## 2020-04-05 DIAGNOSIS — U071 COVID-19: Secondary | ICD-10-CM | POA: Insufficient documentation

## 2020-04-05 DIAGNOSIS — J1282 Pneumonia due to coronavirus disease 2019: Secondary | ICD-10-CM | POA: Insufficient documentation

## 2020-04-05 MED ORDER — EPINEPHRINE 0.3 MG/0.3ML IJ SOAJ: 0.3000 mg | Freq: Once | INTRAMUSCULAR | Status: DC | PRN

## 2020-04-05 MED ORDER — ALBUTEROL SULFATE HFA 108 (90 BASE) MCG/ACT IN AERS: 2.0000 | INHALATION_SPRAY | Freq: Once | RESPIRATORY_TRACT | Status: DC | PRN

## 2020-04-05 MED ORDER — METHYLPREDNISOLONE SODIUM SUCC 125 MG IJ SOLR: 125.0000 mg | Freq: Once | INTRAMUSCULAR | Status: DC | PRN

## 2020-04-05 MED ORDER — SODIUM CHLORIDE 0.9 % IV SOLN
100.0000 mg | Freq: Once | INTRAVENOUS | Status: AC
  Administered 2020-04-05: 100 mg via INTRAVENOUS

## 2020-04-05 MED ORDER — FAMOTIDINE IN NACL 20-0.9 MG/50ML-% IV SOLN: 20.0000 mg | Freq: Once | INTRAVENOUS | Status: DC | PRN

## 2020-04-05 MED ORDER — SODIUM CHLORIDE 0.9 % IV SOLN: INTRAVENOUS | Status: DC | PRN

## 2020-04-05 MED ORDER — DIPHENHYDRAMINE HCL 50 MG/ML IJ SOLN: 50.0000 mg | Freq: Once | INTRAMUSCULAR | Status: DC | PRN

## 2020-04-05 NOTE — Discharge Instructions (Signed)
10 Things You Can Do to Manage Your COVID-19 Symptoms at Home °If you have possible or confirmed COVID-19: °1. Stay home except to get medical care. °2. Monitor your symptoms carefully. If your symptoms get worse, call your healthcare provider immediately. °3. Get rest and stay hydrated. °4. If you have a medical appointment, call the healthcare provider ahead of time and tell them that you have or may have COVID-19. °5. For medical emergencies, call 911 and notify the dispatch personnel that you have or may have COVID-19. °6. Cover your cough and sneezes with a tissue or use the inside of your elbow. °7. Wash your hands often with soap and water for at least 20 seconds or clean your hands with an alcohol-based hand sanitizer that contains at least 60% alcohol. °8. As much as possible, stay in a specific room and away from other people in your home. Also, you should use a separate bathroom, if available. If you need to be around other people in or outside of the home, wear a mask. °9. Avoid sharing personal items with other people in your household, like dishes, towels, and bedding. °10. Clean all surfaces that are touched often, like counters, tabletops, and doorknobs. Use household cleaning sprays or wipes according to the label instructions. °cdc.gov/coronavirus °09/26/2019 °This information is not intended to replace advice given to you by your health care provider. Make sure you discuss any questions you have with your health care provider. °Document Revised: 01/12/2020 Document Reviewed: 01/12/2020 °Elsevier Patient Education © 2021 Elsevier Inc. °If you have any questions or concerns after the infusion please call the Advanced Practice Provider on call at [phone]. This number is ONLY intended for your use regarding questions or concerns about the infusion post-treatment side-effects.  Please do not provide this number to others for use. For return to work notes please contact your primary care provider.   ° °If someone you know is interested in receiving treatment please have them contact their MD for a referral or visit [email or web address] ° ° ° °

## 2020-04-05 NOTE — Progress Notes (Signed)
  Diagnosis: COVID-19  Physician: Dr. Wright  Procedure: Covid Infusion Clinic Med: remdesivir infusion - Provided patient with remdesivir fact sheet for patients, parents and caregivers prior to infusion.  Complications: No immediate complications noted.  Discharge: Discharged home   Charnette Younkin Richards 04/05/2020   

## 2020-04-06 ENCOUNTER — Ambulatory Visit (HOSPITAL_COMMUNITY)
Admission: RE | Admit: 2020-04-06 | Discharge: 2020-04-06 | Disposition: A | Discharge status: Home / Self Care | Payer: Medicare HMO | Source: Ambulatory Visit | Attending: Pulmonary Disease | Admitting: Pulmonary Disease

## 2020-04-06 DIAGNOSIS — U071 COVID-19: Secondary | ICD-10-CM | POA: Insufficient documentation

## 2020-04-06 DIAGNOSIS — J1282 Pneumonia due to coronavirus disease 2019: Secondary | ICD-10-CM | POA: Diagnosis not present

## 2020-04-06 MED ORDER — SODIUM CHLORIDE 0.9 % IV SOLN
100.0000 mg | Freq: Once | INTRAVENOUS | Status: AC
  Administered 2020-04-06: 100 mg via INTRAVENOUS

## 2020-04-06 MED ORDER — METHYLPREDNISOLONE SODIUM SUCC 125 MG IJ SOLR: 125.0000 mg | Freq: Once | INTRAMUSCULAR | Status: DC | PRN

## 2020-04-06 MED ORDER — ALBUTEROL SULFATE HFA 108 (90 BASE) MCG/ACT IN AERS: 2.0000 | INHALATION_SPRAY | Freq: Once | RESPIRATORY_TRACT | Status: DC | PRN

## 2020-04-06 MED ORDER — SODIUM CHLORIDE 0.9 % IV SOLN: INTRAVENOUS | Status: DC | PRN

## 2020-04-06 MED ORDER — FAMOTIDINE IN NACL 20-0.9 MG/50ML-% IV SOLN: 20.0000 mg | Freq: Once | INTRAVENOUS | Status: DC | PRN

## 2020-04-06 MED ORDER — DIPHENHYDRAMINE HCL 50 MG/ML IJ SOLN: 50.0000 mg | Freq: Once | INTRAMUSCULAR | Status: DC | PRN

## 2020-04-06 MED ORDER — EPINEPHRINE 0.3 MG/0.3ML IJ SOAJ: 0.3000 mg | Freq: Once | INTRAMUSCULAR | Status: DC | PRN

## 2020-04-06 NOTE — Discharge Instructions (Signed)
10 Things You Can Do to Manage Your COVID-19 Symptoms at Home °If you have possible or confirmed COVID-19: °1. Stay home except to get medical care. °2. Monitor your symptoms carefully. If your symptoms get worse, call your healthcare provider immediately. °3. Get rest and stay hydrated. °4. If you have a medical appointment, call the healthcare provider ahead of time and tell them that you have or may have COVID-19. °5. For medical emergencies, call 911 and notify the dispatch personnel that you have or may have COVID-19. °6. Cover your cough and sneezes with a tissue or use the inside of your elbow. °7. Wash your hands often with soap and water for at least 20 seconds or clean your hands with an alcohol-based hand sanitizer that contains at least 60% alcohol. °8. As much as possible, stay in a specific room and away from other people in your home. Also, you should use a separate bathroom, if available. If you need to be around other people in or outside of the home, wear a mask. °9. Avoid sharing personal items with other people in your household, like dishes, towels, and bedding. °10. Clean all surfaces that are touched often, like counters, tabletops, and doorknobs. Use household cleaning sprays or wipes according to the label instructions. °cdc.gov/coronavirus °09/26/2019 °This information is not intended to replace advice given to you by your health care provider. Make sure you discuss any questions you have with your health care provider. °Document Revised: 01/12/2020 Document Reviewed: 01/12/2020 °Elsevier Patient Education © 2021 Elsevier Inc. °If you have any questions or concerns after the infusion please call the Advanced Practice Provider on call at [phone]. This number is ONLY intended for your use regarding questions or concerns about the infusion post-treatment side-effects.  Please do not provide this number to others for use. For return to work notes please contact your primary care provider.   ° °If someone you know is interested in receiving treatment please have them contact their MD for a referral or visit [email or web address] ° ° ° °

## 2020-04-06 NOTE — Progress Notes (Signed)
  Diagnosis: COVID-19  Physician: Dr. Patrick Wright  Procedure: Cov[facility] Infusion Clinic Med: remdesivir infusion - Provided patient with remdesivir fact sheet for patients, parents and caregivers prior to infusion.  Complications: No immediate complications noted.  Discharge: Discharged home   Kevin Smith 04/06/2020  

## 2020-04-07 ENCOUNTER — Ambulatory Visit (HOSPITAL_COMMUNITY)
Admission: RE | Admit: 2020-04-07 | Discharge: 2020-04-07 | Disposition: A | Discharge status: Home / Self Care | Payer: Medicare HMO | Source: Ambulatory Visit | Attending: Pulmonary Disease | Admitting: Pulmonary Disease

## 2020-04-07 DIAGNOSIS — U071 COVID-19: Secondary | ICD-10-CM | POA: Diagnosis not present

## 2020-04-07 MED ORDER — SODIUM CHLORIDE 0.9 % IV SOLN: INTRAVENOUS | Status: DC | PRN

## 2020-04-07 MED ORDER — METHYLPREDNISOLONE SODIUM SUCC 125 MG IJ SOLR: 125.0000 mg | Freq: Once | INTRAMUSCULAR | Status: DC | PRN

## 2020-04-07 MED ORDER — EPINEPHRINE 0.3 MG/0.3ML IJ SOAJ: 0.3000 mg | Freq: Once | INTRAMUSCULAR | Status: DC | PRN

## 2020-04-07 MED ORDER — ALBUTEROL SULFATE HFA 108 (90 BASE) MCG/ACT IN AERS: 2.0000 | INHALATION_SPRAY | Freq: Once | RESPIRATORY_TRACT | Status: DC | PRN

## 2020-04-07 MED ORDER — DIPHENHYDRAMINE HCL 50 MG/ML IJ SOLN: 50.0000 mg | Freq: Once | INTRAMUSCULAR | Status: DC | PRN

## 2020-04-07 MED ORDER — FAMOTIDINE IN NACL 20-0.9 MG/50ML-% IV SOLN: 20.0000 mg | Freq: Once | INTRAVENOUS | Status: DC | PRN

## 2020-04-07 MED ORDER — SODIUM CHLORIDE 0.9 % IV SOLN
100.0000 mg | Freq: Once | INTRAVENOUS | Status: AC
  Administered 2020-04-07: 100 mg via INTRAVENOUS

## 2020-04-07 NOTE — Progress Notes (Signed)
  Diagnosis: COVID-19  Physician:dr wright  Procedure: Covid Infusion Clinic Med: remdesivir infusion - Provided patient with remdesivir fact sheet for patients, parents and caregivers prior to infusion.  Complications: No immediate complications noted.  Discharge: Discharged home   Kevin Smith [name] 04/07/2020  

## 2020-04-07 NOTE — Progress Notes (Signed)
Patient reviewed Fact Sheet for Patients, Parents, and Caregivers for Emergency Use of remdesivir for the Treatment of Coronavirus. Patient also reviewed and is agreeable to the estimated cost of treatment. Patient is agreeable to proceed.

## 2020-04-07 NOTE — Discharge Instructions (Signed)
COVID-19 Quarantine vs. Isolation QUARANTINE keeps someone who was in close contact with someone who has COVID-19 away from others. Quarantine if you have been in close contact with someone who has COVID-19, unless you have been fully vaccinated. If you are fully vaccinated  You do NOT need to quarantine unless they have symptoms  Get tested 3-5 days after your exposure, even if you don't have symptoms  Wear a mask indoors in public for 14 days following exposure or until your test result is negative If you are not fully vaccinated  Stay home for 14 days after your last contact with a person who has COVID-19  Watch for fever (100.37F), cough, shortness of breath, or other symptoms of COVID-19  If possible, stay away from people you live with, especially people who are at higher risk for getting very sick from COVID-19  Contact your local public health department for options in your area to possibly shorten your quarantine ISOLATION keeps someone who is sick or tested positive for COVID-19 without symptoms away from others, even in their own home. People who are in isolation should stay home and stay in a specific "sick room" or area and use a separate bathroom (if available). If you are sick and think or know you have COVID-19 Stay home until after  At least 10 days since symptoms first appeared and  At least 24 hours with no fever without the use of fever-reducing medications and  Symptoms have improved If you tested positive for COVID-19 but do not have symptoms  Stay home until after 10 days have passed since your positive viral test  If you develop symptoms after testing positive, follow the steps above for those who are sick [email or web address] 12/08/2019 This information is not intended to replace advice given to you by your health care provider. Make sure you discuss any questions you have with your health care provider. Document Revised: 01/12/2020 Document Reviewed:  01/12/2020 Elsevier Patient Education  2021 Elsevier Inc. If you have any questions or concerns after the infusion please call the Advanced Practice Provider on call at [phone]. This number is ONLY intended for your use regarding questions or concerns about the infusion post-treatment side-effects.  Please do not provide this number to others for use. For return to work notes please contact your primary care provider.   If someone you know is interested in receiving treatment please have them contact their MD for a referral or visit [email or web address]

## 2020-04-16 ENCOUNTER — Encounter (HOSPITAL_COMMUNITY): Payer: Self-pay | Admitting: Emergency Medicine

## 2020-04-16 ENCOUNTER — Emergency Department (HOSPITAL_COMMUNITY)
Admission: EM | Admit: 2020-04-16 | Discharge: 2020-04-20 | Disposition: A | Discharge status: Home / Self Care | Payer: Medicare HMO | Attending: Emergency Medicine | Admitting: Emergency Medicine

## 2020-04-16 ENCOUNTER — Other Ambulatory Visit: Payer: Self-pay

## 2020-04-16 DIAGNOSIS — I2510 Atherosclerotic heart disease of native coronary artery without angina pectoris: Secondary | ICD-10-CM | POA: Diagnosis not present

## 2020-04-16 DIAGNOSIS — F17210 Nicotine dependence, cigarettes, uncomplicated: Secondary | ICD-10-CM | POA: Insufficient documentation

## 2020-04-16 DIAGNOSIS — Z20822 Contact with and (suspected) exposure to covid-19: Secondary | ICD-10-CM | POA: Diagnosis not present

## 2020-04-16 DIAGNOSIS — Z955 Presence of coronary angioplasty implant and graft: Secondary | ICD-10-CM | POA: Diagnosis not present

## 2020-04-16 DIAGNOSIS — F10929 Alcohol use, unspecified with intoxication, unspecified: Secondary | ICD-10-CM

## 2020-04-16 DIAGNOSIS — J449 Chronic obstructive pulmonary disease, unspecified: Secondary | ICD-10-CM | POA: Diagnosis not present

## 2020-04-16 DIAGNOSIS — J45909 Unspecified asthma, uncomplicated: Secondary | ICD-10-CM | POA: Insufficient documentation

## 2020-04-16 DIAGNOSIS — R45851 Suicidal ideations: Secondary | ICD-10-CM | POA: Diagnosis not present

## 2020-04-16 DIAGNOSIS — Z951 Presence of aortocoronary bypass graft: Secondary | ICD-10-CM | POA: Diagnosis not present

## 2020-04-16 DIAGNOSIS — F1094 Alcohol use, unspecified with alcohol-induced mood disorder: Secondary | ICD-10-CM | POA: Diagnosis not present

## 2020-04-16 DIAGNOSIS — I110 Hypertensive heart disease with heart failure: Secondary | ICD-10-CM | POA: Insufficient documentation

## 2020-04-16 DIAGNOSIS — I5021 Acute systolic (congestive) heart failure: Secondary | ICD-10-CM | POA: Diagnosis not present

## 2020-04-16 LAB — CBC WITH DIFFERENTIAL/PLATELET
Abs Immature Granulocytes: 0.03 10*3/uL (ref 0.00–0.07)
Basophils Absolute: 0.1 10*3/uL (ref 0.0–0.1)
Basophils Relative: 1 %
Eosinophils Absolute: 0.4 10*3/uL (ref 0.0–0.5)
Eosinophils Relative: 4 %
HCT: 42.1 % (ref 39.0–52.0)
Hemoglobin: 14.2 g/dL (ref 13.0–17.0)
Immature Granulocytes: 0 %
Lymphocytes Relative: 35 %
Lymphs Abs: 3.2 10*3/uL (ref 0.7–4.0)
MCH: 33.5 pg (ref 26.0–34.0)
MCHC: 33.7 g/dL (ref 30.0–36.0)
MCV: 99.3 fL (ref 80.0–100.0)
Monocytes Absolute: 0.6 10*3/uL (ref 0.1–1.0)
Monocytes Relative: 7 %
Neutro Abs: 4.7 10*3/uL (ref 1.7–7.7)
Neutrophils Relative %: 53 %
Platelets: 182 10*3/uL (ref 150–400)
RBC: 4.24 MIL/uL (ref 4.22–5.81)
RDW: 13.6 % (ref 11.5–15.5)
WBC: 9 10*3/uL (ref 4.0–10.5)
nRBC: 0 % (ref 0.0–0.2)

## 2020-04-16 LAB — RESP PANEL BY RT-PCR (FLU A&B, COVID) ARPGX2
Influenza A by PCR: NEGATIVE
Influenza B by PCR: NEGATIVE
SARS Coronavirus 2 by RT PCR: NEGATIVE

## 2020-04-16 LAB — RAPID URINE DRUG SCREEN, HOSP PERFORMED
Amphetamines: NOT DETECTED
Barbiturates: NOT DETECTED
Benzodiazepines: NOT DETECTED
Cocaine: NOT DETECTED
Opiates: NOT DETECTED
Tetrahydrocannabinol: NOT DETECTED

## 2020-04-16 LAB — COMPREHENSIVE METABOLIC PANEL
ALT: 23 U/L (ref 0–44)
AST: 23 U/L (ref 15–41)
Albumin: 3.7 g/dL (ref 3.5–5.0)
Alkaline Phosphatase: 63 U/L (ref 38–126)
Anion gap: 12 (ref 5–15)
BUN: 22 mg/dL (ref 8–23)
CO2: 23 mmol/L (ref 22–32)
Calcium: 8.6 mg/dL — ABNORMAL LOW (ref 8.9–10.3)
Chloride: 92 mmol/L — ABNORMAL LOW (ref 98–111)
Creatinine, Ser: 1.28 mg/dL — ABNORMAL HIGH (ref 0.61–1.24)
GFR, Estimated: >60 mL/min (ref >60)
Glucose, Bld: 92 mg/dL (ref 70–99)
Potassium: 3.6 mmol/L (ref 3.5–5.1)
Sodium: 127 mmol/L — ABNORMAL LOW (ref 135–145)
Total Bilirubin: 1 mg/dL (ref 0.3–1.2)
Total Protein: 6.9 g/dL (ref 6.5–8.1)

## 2020-04-16 LAB — ETHANOL: Alcohol, Ethyl (B): 151 mg/dL — ABNORMAL HIGH (ref <10)

## 2020-04-16 MED ORDER — LORAZEPAM 2 MG/ML IJ SOLN: 0.0000 mg | Freq: Two times a day (BID) | INTRAMUSCULAR | Status: DC

## 2020-04-16 MED ORDER — ALBUTEROL SULFATE HFA 108 (90 BASE) MCG/ACT IN AERS
1.0000 | INHALATION_SPRAY | Freq: Once | RESPIRATORY_TRACT | Status: AC
  Administered 2020-04-16: 1 via RESPIRATORY_TRACT
  Filled 2020-04-16: qty 6.7

## 2020-04-16 MED ORDER — LORAZEPAM 1 MG PO TABS
0.0000 mg | ORAL_TABLET | Freq: Two times a day (BID) | ORAL | Status: DC
  Administered 2020-04-18 – 2020-04-20 (×2): 1 mg via ORAL
  Filled 2020-04-16 (×2): qty 1

## 2020-04-16 MED ORDER — SODIUM CHLORIDE 0.9 % IV BOLUS
1000.0000 mL | Freq: Once | INTRAVENOUS | Status: AC
  Administered 2020-04-16: 1000 mL via INTRAVENOUS

## 2020-04-16 MED ORDER — THIAMINE HCL 100 MG/ML IJ SOLN: 100.0000 mg | Freq: Every day | INTRAMUSCULAR | Status: DC

## 2020-04-16 MED ORDER — LORAZEPAM 2 MG/ML IJ SOLN: 0.0000 mg | Freq: Four times a day (QID) | INTRAMUSCULAR | Status: AC

## 2020-04-16 MED ORDER — LORAZEPAM 1 MG PO TABS
0.0000 mg | ORAL_TABLET | Freq: Four times a day (QID) | ORAL | Status: AC
  Administered 2020-04-17: 1 mg via ORAL
  Filled 2020-04-16: qty 1

## 2020-04-16 MED ORDER — THIAMINE HCL 100 MG PO TABS
100.0000 mg | ORAL_TABLET | Freq: Every day | ORAL | Status: DC
  Administered 2020-04-17 – 2020-04-20 (×4): 100 mg via ORAL
  Filled 2020-04-16 (×5): qty 1

## 2020-04-16 NOTE — ED Notes (Signed)
Pt sleeping at this time.
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
## 2020-04-16 NOTE — Progress Notes (Signed)
CSW requested for COIVD test to be done.  [facility] reviewing patient for bed placement.  CSW to follow up.  Ladoris Gene MSW,LCSWA,LCASA Clinical Social Worker  [place] Disposition [phone] (cell)

## 2020-04-16 NOTE — ED Notes (Signed)
IVC paperwork faxed to [employer].
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
## 2020-04-16 NOTE — BH Assessment (Signed)
Megan Salon, RN, notified through secure chat that per Hillery Jacks, NP, patient is recommended for inpatient treatment.

## 2020-04-16 NOTE — ED Triage Notes (Signed)
Pt brought in by [place] [employer] voluntarily. Pt states he is suicidal since 2016. Pt states he has had 27 beers tonight.

## 2020-04-16 NOTE — ED Notes (Signed)
Faxed IVC paper work to [facility]. Still waiting for patient to be served from the [facility]

## 2020-04-16 NOTE — ED Notes (Signed)
Pt is intoxicated and keeps coming out of room. Pt redirected to go back in his room.

## 2020-04-16 NOTE — ED Notes (Addendum)
Pt belongings- clothes and shoes and coat placed in bag in [facility] lockers Pt changed into purple scrubs.
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
## 2020-04-16 NOTE — ED Provider Notes (Signed)
I've assumed care of this patient from overnight EDP Dr Pilar Plate.  65 yo male w/ hx of excessive beer consumption here with acute alcohol intoxication and suicidal ideations.  Hx of significant self harm in the past.  Today I reviewed his labs- some hyponatremia consistent with beer potomania with Na 127.  Otherwise no anion gap, no new kidney injury, etoh level elevated at 151, CBC unremarkable.  Vitals wnl.  Will give 1L NS here and thiamine.  No evidence of significant etoh withdrawal at this time.  Patient is medically screened and cleared for psychiatric evaluation.   Terald Sleeper, MD 04/16/20 [phone]

## 2020-04-16 NOTE — BH Assessment (Addendum)
Per Hillery Jacks, NP, patient is recommended for inpatient treatment.

## 2020-04-17 MED ORDER — ALBUTEROL SULFATE HFA 108 (90 BASE) MCG/ACT IN AERS
1.0000 | INHALATION_SPRAY | RESPIRATORY_TRACT | Status: DC | PRN
  Administered 2020-04-17 – 2020-04-18 (×2): 1 via RESPIRATORY_TRACT

## 2020-04-17 NOTE — Care Management (Signed)
Per Morrie Sheldon, at  [facility] patient is being reviewed for possible placement.

## 2020-04-18 MED ORDER — AMITRIPTYLINE HCL 25 MG PO TABS
25.0000 mg | ORAL_TABLET | Freq: Every day | ORAL | Status: DC
  Administered 2020-04-18 – 2020-04-19 (×2): 25 mg via ORAL
  Filled 2020-04-18 (×2): qty 1

## 2020-04-18 MED ORDER — CLOPIDOGREL BISULFATE 75 MG PO TABS
75.0000 mg | ORAL_TABLET | Freq: Every day | ORAL | Status: DC
  Administered 2020-04-19 – 2020-04-20 (×2): 75 mg via ORAL
  Filled 2020-04-18 (×2): qty 1

## 2020-04-18 MED ORDER — BUDESON-GLYCOPYRROL-FORMOTEROL 160-9-4.8 MCG/ACT IN AERO
2.0000 | INHALATION_SPRAY | Freq: Two times a day (BID) | RESPIRATORY_TRACT | Status: DC
Start: 2020-04-18 — End: 2020-04-18

## 2020-04-18 MED ORDER — BUDESONIDE 0.5 MG/2ML IN SUSP
0.5000 mg | Freq: Two times a day (BID) | RESPIRATORY_TRACT | Status: DC
  Administered 2020-04-18 – 2020-04-20 (×4): 0.5 mg via RESPIRATORY_TRACT
  Filled 2020-04-18 (×4): qty 2

## 2020-04-18 MED ORDER — ATORVASTATIN CALCIUM 40 MG PO TABS
80.0000 mg | ORAL_TABLET | Freq: Every day | ORAL | Status: DC
  Administered 2020-04-18 – 2020-04-19 (×2): 80 mg via ORAL
  Filled 2020-04-18 (×2): qty 2

## 2020-04-18 MED ORDER — ARFORMOTEROL TARTRATE 15 MCG/2ML IN NEBU
15.0000 ug | INHALATION_SOLUTION | Freq: Two times a day (BID) | RESPIRATORY_TRACT | Status: DC
  Administered 2020-04-18 – 2020-04-20 (×4): 15 ug via RESPIRATORY_TRACT
  Filled 2020-04-18 (×4): qty 2

## 2020-04-18 MED ORDER — ESCITALOPRAM OXALATE 10 MG PO TABS
20.0000 mg | ORAL_TABLET | Freq: Every day | ORAL | Status: DC
  Administered 2020-04-19 – 2020-04-20 (×2): 20 mg via ORAL
  Filled 2020-04-18 (×2): qty 2

## 2020-04-18 MED ORDER — ALBUTEROL SULFATE HFA 108 (90 BASE) MCG/ACT IN AERS
2.0000 | INHALATION_SPRAY | Freq: Four times a day (QID) | RESPIRATORY_TRACT | Status: DC | PRN
  Administered 2020-04-19: 2 via RESPIRATORY_TRACT

## 2020-04-18 MED ORDER — METOPROLOL SUCCINATE ER 25 MG PO TB24
25.0000 mg | ORAL_TABLET | Freq: Every day | ORAL | Status: DC
  Administered 2020-04-19 – 2020-04-20 (×2): 25 mg via ORAL
  Filled 2020-04-18 (×2): qty 1

## 2020-04-18 MED ORDER — FUROSEMIDE 40 MG PO TABS
40.0000 mg | ORAL_TABLET | Freq: Every day | ORAL | Status: DC
  Administered 2020-04-19 – 2020-04-20 (×2): 40 mg via ORAL
  Filled 2020-04-18 (×2): qty 1

## 2020-04-18 MED ORDER — ROPINIROLE HCL 1 MG PO TABS
1.0000 mg | ORAL_TABLET | Freq: Every day | ORAL | Status: DC
  Administered 2020-04-18 – 2020-04-19 (×2): 1 mg via ORAL
  Filled 2020-04-18 (×2): qty 1

## 2020-04-18 MED ORDER — BUPROPION HCL ER (XL) 150 MG PO TB24
150.0000 mg | ORAL_TABLET | Freq: Every day | ORAL | Status: DC
  Administered 2020-04-19 – 2020-04-20 (×2): 150 mg via ORAL
  Filled 2020-04-18 (×2): qty 1

## 2020-04-18 MED ORDER — ASPIRIN EC 81 MG PO TBEC
81.0000 mg | DELAYED_RELEASE_TABLET | Freq: Every day | ORAL | Status: DC
  Administered 2020-04-19 – 2020-04-20 (×2): 81 mg via ORAL
  Filled 2020-04-18 (×2): qty 1

## 2020-04-18 MED ORDER — LOSARTAN POTASSIUM 25 MG PO TABS
50.0000 mg | ORAL_TABLET | Freq: Every day | ORAL | Status: DC
  Administered 2020-04-19 – 2020-04-20 (×2): 50 mg via ORAL
  Filled 2020-04-18 (×2): qty 2

## 2020-04-18 MED ORDER — UMECLIDINIUM BROMIDE 62.5 MCG/INH IN AEPB
1.0000 | INHALATION_SPRAY | Freq: Every day | RESPIRATORY_TRACT | Status: DC
  Administered 2020-04-19 – 2020-04-20 (×2): 1 via RESPIRATORY_TRACT
  Filled 2020-04-18: qty 7

## 2020-04-18 NOTE — ED Provider Notes (Signed)
Emergency Medicine Observation Re-evaluation Note  Kevin Smith is a 65 y.o. male, seen on rounds today.  Pt initially presented to the ED for complaints of alcohol abuse/intoxication and depression with thoughts of not wanting to live.   Pt currently is calm and alert.   Physical Exam  BP (!) 148/103   Pulse 87   Temp 98 F (36.7 C) (Oral)   Resp 16   Ht 1.803 m (5\' 11" )   Wt 99.8 kg   SpO2 98%   BMI 30.69 kg/m  Physical Exam General: alert, content Cardiac: regular rate Lungs: breathing comfortably Psych: alert, depressed mood.   ED Course / MDM  I have reviewed the labs performed to date as well as medications administered while in observation.  Recent changes in the last 24 hours include BH reassessment - continued plan of finding inpatient psychiatric treatment.   RN indicates home meds need to be ordered - ordered.   Plan  Current plan is for inpatient psychiatric placement and treatment.      , MD 04/18/20 1950

## 2020-04-18 NOTE — BH Assessment (Signed)
Pt remains at [facility] under IVC due to suicidal ideation.  Pt was reassessed this AM.  He was sitting upright and eating.  Mood and affect were depressed.  Pt stated that ''there isn't any change.''  He stated, ''I'm not suicidal, but I want to die.  I've tried it, and it doesn't work.  I don't care anymore.''  This is same presentation as date of assessment.  Recommend continued inpatient.

## 2020-04-18 NOTE — Care Management (Signed)
Write followed up with Sandre Kitty [phone] and was informed that weekend staff comes in at 10am and I can call back to find out the patient's placement status.

## 2020-04-18 NOTE — Care Management (Signed)
Per Rhondie, at [place] patient was denied due to substance abuse.

## 2020-04-19 MED ORDER — ACETAMINOPHEN 325 MG PO TABS
650.0000 mg | ORAL_TABLET | Freq: Once | ORAL | Status: AC
  Administered 2020-04-19: 650 mg via ORAL
  Filled 2020-04-19: qty 2

## 2020-04-20 NOTE — ED Notes (Signed)
Attempted 4 times to call report to [phone], first call transferred me to nurse with no answer and remaining calls were not answered.

## 2020-04-20 NOTE — Progress Notes (Signed)
Pt continues to meet inpatient criteria. Referral information has been sent to the following hospitals for review:  [facility] Details  [facility] Details  [facility] Details   [facility] Details  [facility] Details  [facility]   [facility] Details  [facility] Details  [facility] Details  Details  [facility] Details  Details  [facility] Details  [facility]  Disposition will continue to follow.    Wells Guiles, MSW, LCSW, LCAS Clinical Social Worker II Disposition CSW [phone]

## 2020-04-20 NOTE — ED Notes (Signed)
Called Maralyn Sago (SW) to get another number to call report.  Given [phone], no answer or voicemail.
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
## 2020-04-20 NOTE — Discharge Instructions (Signed)
Transfer to [facility],   Dr.  Forrestine Him accepting
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
## 2020-04-26 ENCOUNTER — Ambulatory Visit: Payer: Medicare HMO | Admitting: Physician Assistant

## 2020-05-19 ENCOUNTER — Ambulatory Visit (INDEPENDENT_AMBULATORY_CARE_PROVIDER_SITE_OTHER): Payer: Medicare HMO | Admitting: Family Medicine

## 2020-05-19 ENCOUNTER — Encounter: Payer: Self-pay | Admitting: Family Medicine

## 2020-05-19 DIAGNOSIS — J441 Chronic obstructive pulmonary disease with (acute) exacerbation: Secondary | ICD-10-CM | POA: Diagnosis not present

## 2020-05-19 MED ORDER — AZITHROMYCIN 250 MG PO TABS: ORAL_TABLET | ORAL | 0 refills | Status: DC

## 2020-05-19 MED ORDER — METHYLPREDNISOLONE 4 MG PO TBPK: ORAL_TABLET | ORAL | 0 refills | Status: DC
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
## 2020-05-20 ENCOUNTER — Ambulatory Visit: Payer: Medicare HMO | Admitting: Student

## 2020-05-21 ENCOUNTER — Encounter: Payer: Self-pay | Admitting: Student

## 2020-05-25 ENCOUNTER — Telehealth: Payer: Self-pay | Admitting: Family Medicine

## 2020-05-25 NOTE — Telephone Encounter (Signed)
Pt was seen in after hours 3/9 and his symptoms have improved slightly but he is wanting to know if there is anything else that he can try to treat his symptoms with.

## 2020-05-25 NOTE — Telephone Encounter (Signed)
Attempted to contact patient twice- number not working   Need more information - what symptoms does patient still have ?

## 2020-05-31 ENCOUNTER — Encounter: Payer: Self-pay | Admitting: Family

## 2020-05-31 ENCOUNTER — Ambulatory Visit (INDEPENDENT_AMBULATORY_CARE_PROVIDER_SITE_OTHER): Payer: Medicare HMO | Admitting: Family

## 2020-05-31 DIAGNOSIS — J441 Chronic obstructive pulmonary disease with (acute) exacerbation: Secondary | ICD-10-CM

## 2020-05-31 DIAGNOSIS — J449 Chronic obstructive pulmonary disease, unspecified: Secondary | ICD-10-CM

## 2020-05-31 MED ORDER — PREDNISONE 10 MG PO TABS: ORAL_TABLET | ORAL | 0 refills | Status: DC

## 2020-05-31 MED ORDER — DOXYCYCLINE HYCLATE 100 MG PO TABS: 100.0000 mg | ORAL_TABLET | Freq: Two times a day (BID) | ORAL | 0 refills | Status: DC

## 2020-05-31 MED ORDER — VENTOLIN HFA 108 (90 BASE) MCG/ACT IN AERS: 2.0000 | INHALATION_SPRAY | Freq: Four times a day (QID) | RESPIRATORY_TRACT | 5 refills | Status: DC | PRN

## 2020-05-31 NOTE — Progress Notes (Signed)
Virtual Visit via telephone Note Due to COVID-19 pandemic this visit was conducted virtually. This visit type was conducted due to national recommendations for restrictions regarding the COVID-19 Pandemic (e.g. social distancing, sheltering in place) in an effort to limit this patient's exposure and mitigate transmission in our community. All issues noted in this document were discussed and addressed.  A physical exam was not performed with this format.  I connected with Kevin Smith on 05/31/20 at 2:48 pm  by telephone and verified that I am speaking with the correct person using two identifiers. Kevin Smith is currently located at home and no one  is currently with him  during visit. The provider, Jannifer Rodney, FNP is located in their office at time of visit.  I discussed the limitations, risks, security and privacy concerns of performing an evaluation and management service by telephone and the availability of in person appointments. I also discussed with the patient that there may be a patient responsible charge related to this service. The patient expressed understanding and agreed to proceed.   History and Present Illness:  Cough This is a new problem. The current episode started 1 to 4 weeks ago. The problem has been gradually worsening. The problem occurs every few minutes. The cough is productive of sputum. Associated symptoms include nasal congestion, postnasal drip, rhinorrhea, shortness of breath and wheezing. Pertinent negatives include no chills, ear congestion, ear pain, fever, headaches or myalgias. The symptoms are aggravated by lying down. Risk factors for lung disease include smoking/tobacco exposure. He has tried rest, OTC cough suppressant, prescription cough suppressant and oral steroids for the symptoms. The treatment provided mild relief. His past medical history is significant for COPD.      Review of Systems  Constitutional: Negative for chills and fever.  HENT:  Positive for postnasal drip and rhinorrhea. Negative for ear pain.   Respiratory: Positive for cough, shortness of breath and wheezing.   Musculoskeletal: Negative for myalgias.  Neurological: Negative for headaches.     Observations/Objective: No SOB or distress noted, hoarse voice  Assessment and Plan: 1. COPD exacerbation (HCC) Rest Continue Mucinex Start long acting prednisone Continue inhalers Smoking cessation  Keep follow up with PCP - predniSONE (DELTASONE) 10 MG tablet; Days 1-4 take 4 tablets (40 mg) daily  Days 5-8 take 3 tablets (30 mg) daily, Days 9-11 take 2 tablets (20 mg) daily, Days 12-14 take 1 tablet (10 mg) daily.  Dispense: 37 tablet; Refill: 0 - doxycycline (VIBRA-TABS) 100 MG tablet; Take 1 tablet (100 mg total) by mouth 2 (two) times daily.  Dispense: 20 tablet; Refill: 0 - VENTOLIN HFA 108 (90 Base) MCG/ACT inhaler; Inhale 2 puffs into the lungs every 6 (six) hours as needed for wheezing or shortness of breath.  Dispense: 18 g; Refill: 5  2. COPD with chronic bronchitis and emphysema (HCC) - VENTOLIN HFA 108 (90 Base) MCG/ACT inhaler; Inhale 2 puffs into the lungs every 6 (six) hours as needed for wheezing or shortness of breath.  Dispense: 18 g; Refill: 5      I discussed the assessment and treatment plan with the patient. The patient was provided an opportunity to ask questions and all were answered. The patient agreed with the plan and demonstrated an understanding of the instructions.   The patient was advised to call back or seek an in-person evaluation if the symptoms worsen or if the condition fails to improve as anticipated.  The above assessment and management plan was discussed with the patient.  The patient verbalized understanding of and has agreed to the management plan. Patient is aware to call the clinic if symptoms persist or worsen. Patient is aware when to return to the clinic for a follow-up visit. Patient educated on when it is appropriate  to go to the emergency department.   Time call ended:  3:01 pm   I provided 13 minutes of non-face-to-face time during this encounter.    Jannifer Rodney, FNP
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
## 2020-06-10 ENCOUNTER — Ambulatory Visit (INDEPENDENT_AMBULATORY_CARE_PROVIDER_SITE_OTHER): Payer: Medicare HMO | Admitting: Student

## 2020-06-10 ENCOUNTER — Other Ambulatory Visit: Payer: Self-pay

## 2020-06-10 ENCOUNTER — Encounter: Payer: Self-pay | Admitting: Student

## 2020-06-10 VITALS — BP 122/82 | HR 86 | Ht 71.5 in | Wt 237.0 lb

## 2020-06-10 DIAGNOSIS — I712 Thoracic aortic aneurysm, without rupture, unspecified: Secondary | ICD-10-CM

## 2020-06-10 DIAGNOSIS — I25810 Atherosclerosis of coronary artery bypass graft(s) without angina pectoris: Secondary | ICD-10-CM

## 2020-06-10 DIAGNOSIS — I255 Ischemic cardiomyopathy: Secondary | ICD-10-CM

## 2020-06-10 DIAGNOSIS — J449 Chronic obstructive pulmonary disease, unspecified: Secondary | ICD-10-CM

## 2020-06-10 NOTE — Patient Instructions (Signed)
Medication Instructions:  Your physician recommends that you continue on your current medications as directed. Please refer to the Current Medication list given to you today.  *If you need a refill on your cardiac medications before your next appointment, please call your pharmacy*   Lab Work: None If you have labs (blood work) drawn today and your tests are completely normal, you will receive your results only by: Marland Kitchen MyChart Message (if you have MyChart) OR . A paper copy in the mail If you have any lab test that is abnormal or we need to change your treatment, we will call you to review the results.   Testing/Procedures: None   Follow-Up: At [facility], you and your health needs are our priority.  As part of our continuing mission to provide you with exceptional heart care, we have created designated Provider Care Teams.  These Care Teams include your primary Cardiologist (physician) and Advanced Practice Providers (APPs -  Physician Assistants and Nurse Practitioners) who all work together to provide you with the care you need, when you need it.  We recommend signing up for the patient portal called "MyChart".  Sign up information is provided on this After Visit Summary.  MyChart is used to connect with patients for Virtual Visits (Telemedicine).  Patients are able to view lab/test results, encounter notes, upcoming appointments, etc.  Non-urgent messages can be sent to your provider as well.   To learn more about what you can do with MyChart, go to [email or web address].    Your next appointment:   6 month(s)  The format for your next appointment:   In Person  Provider:   You may see Dina Rich, MD or one of the following Advanced Practice Providers on your designated Care Team:    Randall An, [place]     Other Instructions Referral to [facility]- They will call you with an appointment.

## 2020-07-13 ENCOUNTER — Other Ambulatory Visit: Payer: Self-pay | Admitting: Student

## 2020-07-14 NOTE — Telephone Encounter (Signed)
This is a [place] pt.  °

## 2020-07-16 ENCOUNTER — Institutional Professional Consult (permissible substitution): Payer: Medicare HMO | Admitting: Internal Medicine

## 2020-09-08 ENCOUNTER — Other Ambulatory Visit: Payer: Self-pay

## 2020-09-08 ENCOUNTER — Ambulatory Visit (INDEPENDENT_AMBULATORY_CARE_PROVIDER_SITE_OTHER): Payer: Medicare HMO | Admitting: Family Medicine

## 2020-09-08 ENCOUNTER — Encounter: Payer: Self-pay | Admitting: Family Medicine

## 2020-09-08 VITALS — BP 132/89 | HR 83 | Temp 96.5°F | Ht 71.5 in | Wt 231.6 lb

## 2020-09-08 DIAGNOSIS — F332 Major depressive disorder, recurrent severe without psychotic features: Secondary | ICD-10-CM

## 2020-09-08 DIAGNOSIS — F419 Anxiety disorder, unspecified: Secondary | ICD-10-CM

## 2020-09-08 DIAGNOSIS — I5043 Acute on chronic combined systolic (congestive) and diastolic (congestive) heart failure: Secondary | ICD-10-CM | POA: Diagnosis not present

## 2020-09-08 MED ORDER — POTASSIUM CHLORIDE ER 10 MEQ PO TBCR
10.0000 meq | EXTENDED_RELEASE_TABLET | Freq: Every day | ORAL | 2 refills | Status: DC
Start: 2020-09-08 — End: 2021-08-04

## 2020-09-08 MED ORDER — FLUOXETINE HCL 20 MG PO CAPS: 20.0000 mg | ORAL_CAPSULE | Freq: Every day | ORAL | 2 refills | Status: DC

## 2020-09-08 MED ORDER — FUROSEMIDE 20 MG PO TABS: 20.0000 mg | ORAL_TABLET | Freq: Every day | ORAL | 2 refills | Status: DC

## 2020-09-08 MED ORDER — HYDROXYZINE HCL 10 MG PO TABS: 10.0000 mg | ORAL_TABLET | Freq: Three times a day (TID) | ORAL | 2 refills | Status: DC | PRN

## 2020-09-08 NOTE — Patient Instructions (Signed)
You may take 2 lasix x3 days, then only take 1.
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
## 2020-09-09 LAB — CMP14+EGFR
ALT: 28 IU/L (ref 0–44)
AST: 24 IU/L (ref 0–40)
Albumin/Globulin Ratio: 1.8 (ref 1.2–2.2)
Albumin: 4.7 g/dL (ref 3.8–4.8)
Alkaline Phosphatase: 93 IU/L (ref 44–121)
BUN/Creatinine Ratio: 9 — ABNORMAL LOW (ref 10–24)
BUN: 11 mg/dL (ref 8–27)
Bilirubin Total: 0.6 mg/dL (ref 0.0–1.2)
CO2: 23 mmol/L (ref 20–29)
Calcium: 9.9 mg/dL (ref 8.6–10.2)
Chloride: 92 mmol/L — ABNORMAL LOW (ref 96–106)
Creatinine, Ser: 1.25 mg/dL (ref 0.76–1.27)
Globulin, Total: 2.6 g/dL (ref 1.5–4.5)
Glucose: 85 mg/dL (ref 65–99)
Potassium: 4.8 mmol/L (ref 3.5–5.2)
Sodium: 134 mmol/L (ref 134–144)
Total Protein: 7.3 g/dL (ref 6.0–8.5)
eGFR: 64 mL/min/{1.73_m2} (ref >59)

## 2020-09-09 LAB — LIPID PANEL
Chol/HDL Ratio: 3 ratio (ref 0.0–5.0)
Cholesterol, Total: 222 mg/dL — ABNORMAL HIGH (ref 100–199)
HDL: 74 mg/dL (ref >39)
LDL Chol Calc (NIH): 122 mg/dL — ABNORMAL HIGH (ref 0–99)
Triglycerides: 152 mg/dL — ABNORMAL HIGH (ref 0–149)
VLDL Cholesterol Cal: 26 mg/dL (ref 5–40)

## 2020-09-09 LAB — BRAIN NATRIURETIC PEPTIDE: BNP: 102.2 pg/mL — ABNORMAL HIGH (ref 0.0–100.0)

## 2020-09-09 LAB — TSH: TSH: 2.76 u[IU]/mL (ref 0.450–4.500)

## 2020-09-14 ENCOUNTER — Other Ambulatory Visit: Payer: Self-pay | Admitting: Family Medicine

## 2020-09-14 DIAGNOSIS — Z951 Presence of aortocoronary bypass graft: Secondary | ICD-10-CM

## 2020-09-14 DIAGNOSIS — I252 Old myocardial infarction: Secondary | ICD-10-CM

## 2020-09-14 DIAGNOSIS — E785 Hyperlipidemia, unspecified: Secondary | ICD-10-CM

## 2020-09-14 DIAGNOSIS — I25709 Atherosclerosis of coronary artery bypass graft(s), unspecified, with unspecified angina pectoris: Secondary | ICD-10-CM

## 2020-09-14 MED ORDER — EZETIMIBE 10 MG PO TABS: 10.0000 mg | ORAL_TABLET | Freq: Every day | ORAL | 2 refills | Status: DC

## 2020-09-26 ENCOUNTER — Other Ambulatory Visit: Payer: Self-pay | Admitting: Student

## 2020-09-30 ENCOUNTER — Telehealth: Payer: Self-pay | Admitting: *Deleted

## 2020-09-30 NOTE — Telephone Encounter (Signed)
It does not sound like his nausea is related to the medication if it just started and he has been on the medication for 3 weeks.  If he feels it is related, he can try taking the medication at night so that he will be asleep and not experience this side effect.

## 2020-09-30 NOTE — Telephone Encounter (Signed)
Has been taking Prozac for last 3 weeks, but last 3-4 days been very nauseated. Pt eats before he takes his meds and drinks water throughout day. Has 7 doses let in this first prescription

## 2020-10-01 NOTE — Telephone Encounter (Signed)
Patient aware and verbalizes understanding. 

## 2020-10-05 ENCOUNTER — Telehealth: Payer: Self-pay | Admitting: *Deleted

## 2020-10-05 MED ORDER — PANTOPRAZOLE SODIUM 40 MG PO TBEC: 40.0000 mg | DELAYED_RELEASE_TABLET | Freq: Every day | ORAL | 3 refills | Status: DC

## 2020-10-05 NOTE — Addendum Note (Signed)
Addended by: [name] on: 10/05/2020 05:36 PM   Modules accepted: Orders

## 2020-10-05 NOTE — Telephone Encounter (Signed)
VM from pt still not feeling well stomach still upset use to be on Omeprazole, thinks he needs to go back on it. Please advise

## 2020-10-05 NOTE — Telephone Encounter (Signed)
Upon chart review it appears his omeprazole was changed to pantoprazole due to medication interactions.  I do not see in his chart why he quit taking this, so I would be fine with him resuming the pantoprazole 40 mg once daily.  Does he have any at home?

## 2020-10-05 NOTE — Telephone Encounter (Signed)
Prescription sent

## 2020-10-05 NOTE — Telephone Encounter (Signed)
Patient aware, he does not have any Pantoprazole at home and would like prescription sent to [facility].

## 2020-10-15 ENCOUNTER — Other Ambulatory Visit: Payer: Self-pay

## 2020-10-15 ENCOUNTER — Encounter: Payer: Self-pay | Admitting: Family Medicine

## 2020-10-15 ENCOUNTER — Ambulatory Visit (INDEPENDENT_AMBULATORY_CARE_PROVIDER_SITE_OTHER): Payer: Medicare HMO | Admitting: Family Medicine

## 2020-10-15 VITALS — BP 136/89 | HR 79 | Temp 97.4°F | Ht 71.5 in | Wt 226.4 lb

## 2020-10-15 DIAGNOSIS — R110 Nausea: Secondary | ICD-10-CM | POA: Diagnosis not present

## 2020-10-15 DIAGNOSIS — F419 Anxiety disorder, unspecified: Secondary | ICD-10-CM

## 2020-10-15 DIAGNOSIS — K921 Melena: Secondary | ICD-10-CM

## 2020-10-15 DIAGNOSIS — F332 Major depressive disorder, recurrent severe without psychotic features: Secondary | ICD-10-CM | POA: Diagnosis not present

## 2020-10-15 MED ORDER — FAMOTIDINE 20 MG PO TABS: 20.0000 mg | ORAL_TABLET | Freq: Two times a day (BID) | ORAL | 2 refills | Status: DC
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
## 2020-10-15 NOTE — Patient Instructions (Signed)
We will call as soon as we get your GeneSight test results.

## 2020-10-21 ENCOUNTER — Telehealth: Payer: Self-pay | Admitting: Family Medicine

## 2020-10-21 DIAGNOSIS — F332 Major depressive disorder, recurrent severe without psychotic features: Secondary | ICD-10-CM

## 2020-10-21 DIAGNOSIS — F419 Anxiety disorder, unspecified: Secondary | ICD-10-CM

## 2020-10-21 MED ORDER — DESVENLAFAXINE SUCCINATE ER 25 MG PO TB24: 25.0000 mg | ORAL_TABLET | Freq: Every day | ORAL | 2 refills | Status: DC

## 2020-10-21 NOTE — Telephone Encounter (Signed)
Please let patient know I received his GeneSight test results. I have sent Pristiq 25 mg once daily to the pharmacy for him to start, but it looks like he is going to need a PA. Can we go ahead and get this started? Once he gets it, he can decrease Prozac to every other day x2 weeks and then stop it. He can do this while he is starting the Pristiq.

## 2020-10-21 NOTE — Telephone Encounter (Signed)
Lmtcb.

## 2020-11-01 ENCOUNTER — Other Ambulatory Visit: Payer: Self-pay | Admitting: *Deleted

## 2020-11-01 MED ORDER — IPRATROPIUM-ALBUTEROL 0.5-2.5 (3) MG/3ML IN SOLN: 3.0000 mL | Freq: Three times a day (TID) | RESPIRATORY_TRACT | Status: DC | PRN

## 2020-11-01 NOTE — Telephone Encounter (Signed)
Vm received, pt needing neb solution called into [facility], not breathing to well today Last Rxd by Dr. Laural Benes from hospital Please advise

## 2020-11-11 ENCOUNTER — Telehealth: Payer: Self-pay | Admitting: Family Medicine

## 2020-11-25 ENCOUNTER — Ambulatory Visit (INDEPENDENT_AMBULATORY_CARE_PROVIDER_SITE_OTHER): Payer: Medicare HMO

## 2020-11-25 VITALS — Ht 72.0 in | Wt 226.0 lb

## 2020-11-25 DIAGNOSIS — Z599 Problem related to housing and economic circumstances, unspecified: Secondary | ICD-10-CM

## 2020-11-25 DIAGNOSIS — Z0000 Encounter for general adult medical examination without abnormal findings: Secondary | ICD-10-CM | POA: Diagnosis not present
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
## 2020-11-25 NOTE — Patient Instructions (Addendum)
Mr. Kevin Smith , Thank you for taking time to come for your Medicare Wellness Visit. I appreciate your ongoing commitment to your health goals. Please review the following plan we discussed and let me know if I can assist you in the future.   Screening recommendations/referrals: Colonoscopy: Due. Declined Recommended yearly ophthalmology/optometry visit for glaucoma screening and checkup Recommended yearly dental visit for hygiene and checkup  Vaccinations: Influenza vaccine: Declined Pneumococcal vaccine: Declined Tdap vaccine: Done 01/06/2014 - Repeat in 10 years Shingles vaccine: Declined   Covid-19: Declined  Advanced directives: Advance directive discussed with you today. Even though you declined this today, please call our office should you change your mind, and we can give you the proper paperwork for you to fill out.   Conditions/risks identified: Aim for 30 minutes of exercise or brisk walking each day, drink 6-8 glasses of water and eat lots of fruits and vegetables. Try to quit drinking or limit it to 1-2 per day or less.  If you wish to quit smoking, help is available. For free tobacco cessation program offerings call the [facility] at 770 840 7088 or Live Well Line at 520-723-4540. You may also visit [email or web address] or email [email or web address] for more information on other programs.   You may also call 1-800-QUIT-NOW (970-025-4677) or visit www.[place].ch or www.[facility].org for additional resources on smoking cessation.    Next appointment: Follow up in one year for your annual wellness visit   Preventive Care 40-64 Years, Male Preventive care refers to lifestyle choices and visits with your health care provider that can promote health and wellness. What does preventive care include? A yearly physical exam. This is also called an annual well check. Dental exams once or twice a year. Routine eye exams. Ask your health care provider how  often you should have your eyes checked. Personal lifestyle choices, including: Daily care of your teeth and gums. Regular physical activity. Eating a healthy diet. Avoiding tobacco and drug use. Limiting alcohol use. Practicing safe sex. Taking low-dose aspirin every day starting at age 72. What happens during an annual well check? The services and screenings done by your health care provider during your annual well check will depend on your age, overall health, lifestyle risk factors, and family history of disease. Counseling  Your health care provider may ask you questions about your: Alcohol use. Tobacco use. Drug use. Emotional well-being. Home and relationship well-being. Sexual activity. Eating habits. Work and work Astronomer. Screening  You may have the following tests or measurements: Height, weight, and BMI. Blood pressure. Lipid and cholesterol levels. These may be checked every 5 years, or more frequently if you are over 35 years old. Skin check. Lung cancer screening. You may have this screening every year starting at age 64 if you have a 30-pack-year history of smoking and currently smoke or have quit within the past 15 years. Fecal occult blood test (FOBT) of the stool. You may have this test every year starting at age 12. Flexible sigmoidoscopy or colonoscopy. You may have a sigmoidoscopy every 5 years or a colonoscopy every 10 years starting at age 19. Prostate cancer screening. Recommendations will vary depending on your family history and other risks. Hepatitis C blood test. Hepatitis B blood test. Sexually transmitted disease (STD) testing. Diabetes screening. This is done by checking your blood sugar (glucose) after you have not eaten for a while (fasting). You may have this done every 1-3 years. Discuss your test results, treatment options, and if necessary, the  need for more tests with your health care provider. Vaccines  Your health care provider may  recommend certain vaccines, such as: Influenza vaccine. This is recommended every year. Tetanus, diphtheria, and acellular pertussis (Tdap, Td) vaccine. You may need a Td booster every 10 years. Zoster vaccine. You may need this after age 39. Pneumococcal 13-valent conjugate (PCV13) vaccine. You may need this if you have certain conditions and have not been vaccinated. Pneumococcal polysaccharide (PPSV23) vaccine. You may need one or two doses if you smoke cigarettes or if you have certain conditions. Talk to your health care provider about which screenings and vaccines you need and how often you need them. This information is not intended to replace advice given to you by your health care provider. Make sure you discuss any questions you have with your health care provider. Document Released: 03/26/2015 Document Revised: 11/17/2015 Document Reviewed: 12/29/2014 Elsevier Interactive Patient Education  2017 [employer].  Fall Prevention in the Home Falls can cause injuries. They can happen to people of all ages. There are many things you can do to make your home safe and to help prevent falls. What can I do on the outside of my home? Regularly fix the edges of walkways and driveways and fix any cracks. Remove anything that might make you trip as you walk through a door, such as a raised step or threshold. Trim any bushes or trees on the path to your home. Use bright outdoor lighting. Clear any walking paths of anything that might make someone trip, such as rocks or tools. Regularly check to see if handrails are loose or broken. Make sure that both sides of any steps have handrails. Any raised decks and porches should have guardrails on the edges. Have any leaves, snow, or ice cleared regularly. Use sand or salt on walking paths during winter. Clean up any spills in your garage right away. This includes oil or grease spills. What can I do in the bathroom? Use night lights. Install grab bars  by the toilet and in the tub and shower. Do not use towel bars as grab bars. Use non-skid mats or decals in the tub or shower. If you need to sit down in the shower, use a plastic, non-slip stool. Keep the floor dry. Clean up any water that spills on the floor as soon as it happens. Remove soap buildup in the tub or shower regularly. Attach bath mats securely with double-sided non-slip rug tape. Do not have throw rugs and other things on the floor that can make you trip. What can I do in the bedroom? Use night lights. Make sure that you have a light by your bed that is easy to reach. Do not use any sheets or blankets that are too big for your bed. They should not hang down onto the floor. Have a firm chair that has side arms. You can use this for support while you get dressed. Do not have throw rugs and other things on the floor that can make you trip. What can I do in the kitchen? Clean up any spills right away. Avoid walking on wet floors. Keep items that you use a lot in easy-to-reach places. If you need to reach something above you, use a strong step stool that has a grab bar. Keep electrical cords out of the way. Do not use floor polish or wax that makes floors slippery. If you must use wax, use non-skid floor wax. Do not have throw rugs and other things  on the floor that can make you trip. What can I do with my stairs? Do not leave any items on the stairs. Make sure that there are handrails on both sides of the stairs and use them. Fix handrails that are broken or loose. Make sure that handrails are as long as the stairways. Check any carpeting to make sure that it is firmly attached to the stairs. Fix any carpet that is loose or worn. Avoid having throw rugs at the top or bottom of the stairs. If you do have throw rugs, attach them to the floor with carpet tape. Make sure that you have a light switch at the top of the stairs and the bottom of the stairs. If you do not have them, ask  someone to add them for you. What else can I do to help prevent falls? Wear shoes that: Do not have high heels. Have rubber bottoms. Are comfortable and fit you well. Are closed at the toe. Do not wear sandals. If you use a stepladder: Make sure that it is fully opened. Do not climb a closed stepladder. Make sure that both sides of the stepladder are locked into place. Ask someone to hold it for you, if possible. Clearly mark and make sure that you can see: Any grab bars or handrails. First and last steps. Where the edge of each step is. Use tools that help you move around (mobility aids) if they are needed. These include: Canes. Walkers. Scooters. Crutches. Turn on the lights when you go into a dark area. Replace any light bulbs as soon as they burn out. Set up your furniture so you have a clear path. Avoid moving your furniture around. If any of your floors are uneven, fix them. If there are any pets around you, be aware of where they are. Review your medicines with your doctor. Some medicines can make you feel dizzy. This can increase your chance of falling. Ask your doctor what other things that you can do to help prevent falls. This information is not intended to replace advice given to you by your health care provider. Make sure you discuss any questions you have with your health care provider. Document Released: 12/24/2008 Document Revised: 08/05/2015 Document Reviewed: 04/03/2014 Elsevier Interactive Patient Education  2017 Elsevier Inc.   Alcohol Abuse and Nutrition Alcohol abuse is any pattern of alcohol consumption that harms your health, relationships, or work. Alcohol abuse can cause poor nutrition (malnutrition or malnourishment) and a lack of nutrients (nutrient deficiencies), which can lead to more health problems. Alcohol abuse brings malnutrition and nutrient deficiencies in two ways: It causes your liver to work abnormally. This affects how your body divides  (breaks down) and absorbs nutrients from food. It causes you to eat poorly. Many people who abuse alcohol do not eat enough carbohydrates, protein, fat, vitamins, and minerals. Nutrients that are commonly lacking (deficient) in people who abuse alcohol include: Vitamins. Vitamin A. This is needed for your vision, metabolism, and ability to fight off infections (immunity). B vitamins. These include folate, thiamine, and niacin. These are needed for new cell growth. Vitamin C. This plays an important role in wound healing, immunity, and helping your body to absorb iron. Vitamin D. This is necessary for your body to absorb and use calcium. It is produced by your liver, but you can also get it from food and from sun exposure. Minerals. Calcium. This is needed for healthy bones as well as heart and blood vessel (cardiovascular) function. Iron.  This is important for blood, muscle, and nervous system functioning. Magnesium. This plays an important role in muscle and nerve function, and it helps to control blood sugar and blood pressure. Zinc. This is important for the normal functioning of your nervous system and digestive system (gastrointestinal tract). If you think that you have an alcohol dependency problem, or if it is hard to stop drinking because you feel sick or different when you do not use alcohol, talk with your health care provider or another health professional about where to get help. Nutrition is an essential factor in the therapy for alcohol abuse. Your health care provider or diet and nutrition specialist (dietitian) will work with you to design a plan that can help to restore nutrients to your body and prevent the risk of complications. What is my plan? Your dietitian may develop a specific eating plan that is based on your condition and any other problems that you have. An eating plan will commonly include: A balanced diet. Grains: 6-8 oz (170-227 g) a day. Examples of 1 oz of whole  grains include 1 cup of whole-wheat cereal,  cup of brown rice, or 1 slice of whole-wheat bread. Vegetables: 2-3 cups a day. Examples of 1 cup of vegetables include 2 medium carrots, 1 large tomato, or 2 stalks of celery. Fruits: 1-2 cups a day. Examples of 1 cup of fruit include 1 large banana, 1 small apple, 8 large strawberries, or 1 large orange. Meat and other protein: 5-6 oz (142-170 g) a day. A cut of meat or fish that is the size of a deck of cards is about 3-4 oz. Foods that provide 1 oz of protein include 1 egg,  cup of nuts or seeds, or 1 tablespoon (16 g) of peanut butter. Dairy: 2-3 cups a day. Examples of 1 cup of dairy include 8 oz (230 mL) of milk, 8 oz (230 g) of yogurt, or 1 oz (44 g) of natural cheese. Vitamin and mineral supplements. What are tips for following this plan? Eat frequent meals and snacks. Try to eat 5-6 small meals each day. Take vitamin or mineral supplements as recommended by your dietitian. If you are malnourished or if your dietitian recommends it: You may follow a high-protein, high-calorie diet. This may include: 2,000-3,000 calories (kilocalories) a day. 70-100 g (grams) of protein a day. You may be directed to follow a diet that includes a complete nutritional supplement beverage. This can help to restore calories, protein, and vitamins to your body. Depending on your condition, you may be advised to consume this beverage instead of your meals or in addition to them. Certain medicines may cause changes in your appetite, taste, and weight. Work with your health care provider and dietitian to make any changes to your medicines and eating plan. If you are unable to take in enough food and calories by mouth, your health care provider may recommend a feeding tube. This tube delivers nutritional supplements directly to your stomach. Recommended foods Eat foods that are high in molecules that prevent oxygen from reacting with your food (antioxidants). These  foods include grapes, berries, nuts, green tea, and dark green or orange vegetables. Eating these can help to prevent some of the stress that is placed on your liver by consuming alcohol. Eat a variety of fresh fruits and vegetables each day. This will help you to get fiber and vitamins in your diet. Drink plenty of water and other clear fluids, such as apple juice and broth. Try to  drink at least 48-64 oz (1.5-2 L) of water a day. Include foods fortified with vitamins and minerals in your diet. Commonly fortified foods include milk, orange juice, cereal, and bread. Eat a variety of foods that are high in omega-3 and omega-6 fatty acids. These include fish, nuts and seeds, and soybeans. These foods may help your liver to recover and may also stabilize your mood. If you are a vegetarian: Eat a variety of protein-rich foods. Pair whole grains with plant-based proteins at meals and snack time. For example, eat rice with beans, put peanut butter on whole-grain toast, or eat oatmeal with sunflower seeds. The items listed above may not be a complete list of foods and beverages you can eat. Contact a dietitian for more information. Foods to avoid Avoid foods and drinks that are high in fat and sugar. Sugary drinks, salty snacks, and candy contain empty calories. This means that they lack important nutrients such as protein, fiber, and vitamins. Avoid alcohol. This is the best way to avoid malnutrition due to alcohol abuse. If you must drink, drink measured amounts. Measured drinking means limiting your intake to no more than 1 drink a day for nonpregnant women and 2 drinks a day for men. One drink equals 12 oz (355 mL) of beer, 5 oz (148 mL) of wine, or 1 oz (44 mL) of hard liquor. Limit your intake of caffeine. Replace drinks like coffee and black tea with decaffeinated coffee and decaffeinated herbal tea. The items listed above may not be a complete list of foods and beverages you should avoid. Contact a  dietitian for more information. Summary Alcohol abuse can cause poor nutrition (malnutrition or malnourishment) and a lack of nutrients (nutrient deficiencies), which can lead to more health problems. Common nutrient deficiencies include vitamin deficiencies (A, B, C, and D) and mineral deficiencies (calcium, iron, magnesium, and zinc). Nutrition is an essential factor in the therapy for alcohol abuse. Your health care provider and dietitian can help you to develop a specific eating plan that includes a balanced diet plus vitamin and mineral supplements. This information is not intended to replace advice given to you by your health care provider. Make sure you discuss any questions you have with your health care provider. Document Revised: 01/19/2020 Document Reviewed: 01/19/2020 Elsevier Patient Education  2022 [employer].

## 2020-11-29 ENCOUNTER — Telehealth: Payer: Self-pay

## 2020-11-29 NOTE — Chronic Care Management (AMB) (Signed)
  Chronic Care Management   Note  11/29/2020 Name: Kevin Smith MRN: [id number] DOB: Jun 18, 1955  Kevin Smith is a 65 y.o. year old male who is a primary care patient of Loman Brooklyn, FNP. I reached out to [place] by phone today in response to a referral sent by [name] AWV (annual wellness visit) nurse, Hopkins, Amy E, LPN.      [name] was given information about Chronic Care Management services today including:  CCM service includes personalized support from designated clinical staff supervised by his physician, including individualized plan of care and coordination with other care providers 24/7 contact phone numbers for assistance for urgent and routine care needs. Service will only be billed when office clinical staff spend 20 minutes or more in a month to coordinate care. Only one practitioner may furnish and bill the service in a calendar month. The patient may stop CCM services at any time (effective at the end of the month) by phone call to the office staff. The patient will be responsible for cost sharing (co-pay) of up to 20% of the service fee (after annual deductible is met).  Patient agreed to services and verbal consent obtained.   Follow up plan: Telephone appointment with care management team member scheduled for:12/17/2020  Noreene Larsson, [place], [place], [place] [address] Direct Dial: [phone] [email or web address] Website: [email or web address]

## 2020-11-29 NOTE — Chronic Care Management (AMB) (Signed)
  Chronic Care Management   Outreach Note  11/29/2020 Name: Kevin Smith MRN: [id number] DOB: 1955/09/05  Kevin Smith is a 65 y.o. year old male who is a primary care patient of [name], FNP. I reached out to [employer] by phone today in response to a referral sent by Mr. Kevin Smith's PCP, [name], FNP      An unsuccessful telephone outreach was attempted today. The patient was referred to the case management team for assistance with care management and care coordination.   Follow Up Plan: A HIPAA compliant phone message was left for the patient providing contact information and requesting a return call.  The care management team will reach out to the patient again over the next 7 days.  If patient returns call to provider office, please advise to call Embedded Care Management Care Guide Penne Lash at [phone]  Penne Lash, RMA Care Guide, Embedded Care Coordination [facility]  [place], [place] [address] Direct Dial: [phone] [email or web address]

## 2020-11-30 NOTE — Progress Notes (Signed)
Virtual Visit via Telephone Note  I connected with  Kevin Smith on 11/25/2020 at  2:00 PM EDT by telephone and verified that I am speaking with the correct person using two identifiers.  Location: Patient: Home Provider: [facility] Persons participating in the virtual visit: patient/Nurse Health Advisor   I discussed the limitations, risks, security and privacy concerns of performing an evaluation and management service by telephone and the availability of in person appointments. The patient expressed understanding and agreed to proceed.  Interactive audio and video telecommunications were attempted between this nurse and patient, however failed, due to patient having technical difficulties OR patient did not have access to video capability.  We continued and completed visit with audio only.  Some vital signs may be absent or patient reported.   Mireyah Chervenak Hurman Horn, LPN

## 2020-12-01 ENCOUNTER — Other Ambulatory Visit: Payer: Self-pay | Admitting: Family Medicine

## 2020-12-01 DIAGNOSIS — I5043 Acute on chronic combined systolic (congestive) and diastolic (congestive) heart failure: Secondary | ICD-10-CM

## 2020-12-01 NOTE — Telephone Encounter (Signed)
  Prescription Request  12/01/2020  Is this a "Controlled Substance" medicine? no Have you seen your PCP in the last 2 weeks? yes If YES, route message to pool  -  If NO, patient needs to be scheduled for appointment.  What is the name of the medication or equipment? Medicine for nebulizer  Have you contacted your pharmacy to request a refill?   Which pharmacy would you like this sent to? [facility]   Patient notified that their request is being sent to the clinical staff for review and that they should receive a response within 2 business days.

## 2020-12-02 MED ORDER — IPRATROPIUM-ALBUTEROL 0.5-2.5 (3) MG/3ML IN SOLN: 3.0000 mL | Freq: Three times a day (TID) | RESPIRATORY_TRACT | 0 refills | Status: DC | PRN

## 2020-12-07 ENCOUNTER — Other Ambulatory Visit: Payer: Self-pay | Admitting: Family Medicine

## 2020-12-07 DIAGNOSIS — F332 Major depressive disorder, recurrent severe without psychotic features: Secondary | ICD-10-CM

## 2020-12-17 ENCOUNTER — Ambulatory Visit (INDEPENDENT_AMBULATORY_CARE_PROVIDER_SITE_OTHER): Payer: Medicare HMO | Admitting: Pharmacist

## 2020-12-17 DIAGNOSIS — J449 Chronic obstructive pulmonary disease, unspecified: Secondary | ICD-10-CM

## 2020-12-17 NOTE — Progress Notes (Signed)
Chronic Care Management Pharmacy Note  12/17/2020 Name:  Kevin Smith MRN:  [id number] DOB:  [birth year]  Summary: COPD  Recommendations/Changes made from today's visit: Chronic Obstructive Pulmonary Disease:  New goal. Uncontrolled; current treatment: Breztri (new start) ; Ventolin rescue, DuoNebs PRN Reports breztri samples are helping Only using rescue albuterol 1-2 times per day Smoking 1 ppd; not interested in cessation although offered GOLD Classification:  NOT STAGED; doesn't follow with pulm Most recent Pulmonary Function Testing:  n/a 1 exacerbations requiring treatment in the last 6 months  Assessed patient finances. [place] for Judithann Sauger (will ship to patient's home); 3 weeks worth of samples given  Follow Up Plan: Telephone follow up appointment with care management team member scheduled for: 2 months  Subjective: Kevin Smith is an 64 y.o. year old male who is a primary patient of Loman Brooklyn, FNP.  The CCM team was consulted for assistance with disease management and care coordination needs.    Engaged with patient by telephone for initial visit in response to provider referral for pharmacy case management and/or care coordination services.   Consent to Services:  The patient was given information about Chronic Care Management services, agreed to services, and gave verbal consent prior to initiation of services.  Please see initial visit note for detailed documentation.   Patient Care Team: Loman Brooklyn, FNP as PCP - General (Family Medicine) Sherren Mocha, MD as Consulting Physician (Cardiology) Lavera Guise, [facility] (Pharmacist)  Objective:  Lab Results  Component Value Date   CREATININE 1.21 12/21/2020   CREATININE 1.25 09/08/2020   CREATININE 1.28 (H) 04/16/2020    Lab Results  Component Value Date   HGBA1C 5.4 09/16/2018   Last diabetic Eye exam: No results found for: HMDIABEYEEXA  Last diabetic Foot  exam: No results found for: HMDIABFOOTEX      Component Value Date/Time   CHOL 210 (H) 12/21/2020 1520   TRIG 82 12/21/2020 1520   HDL 92 12/21/2020 1520   CHOLHDL 2.3 12/21/2020 1520   CHOLHDL 2.8 09/16/2018 0504   VLDL 17 09/16/2018 0504   LDLCALC 104 (H) 12/21/2020 1520    Hepatic Function Latest Ref Rng & Units 12/21/2020 09/08/2020 04/16/2020  Total Protein 6.0 - 8.5 g/dL 7.5 7.3 6.9  Albumin 3.8 - 4.8 g/dL 4.9(H) 4.7 3.7  AST 0 - 40 IU/L 29 24 23   ALT 0 - 44 IU/L 24 28 23   Alk Phosphatase 44 - 121 IU/L 99 93 63  Total Bilirubin 0.0 - 1.2 mg/dL 0.7 0.6 1.0    Lab Results  Component Value Date/Time   TSH 2.760 09/08/2020 11:17 AM    CBC Latest Ref Rng & Units 12/21/2020 04/16/2020 04/02/2020  WBC 3.4 - 10.8 x10E3/uL 6.2 9.0 6.9  Hemoglobin 13.0 - 17.7 g/dL 17.3 14.2 14.1  Hematocrit 37.5 - 51.0 % 49.2 42.1 42.4  Platelets 150 - 450 x10E3/uL 282 182 227    No results found for: VD25OH  Clinical ASCVD: Yes  The ASCVD Risk score (Arnett DK, et al., 2019) failed to calculate for the following reasons:   The patient has a prior MI or stroke diagnosis    Other: (CHADS2VASc if Afib, PHQ9 if depression, MMRC or CAT for COPD, ACT, DEXA)  Social History   Tobacco Use  Smoking Status Every Day   Packs/day: 1.00   Types: Cigarettes  Smokeless Tobacco Former   Types: Chew   BP Readings from Last 3 Encounters:  12/21/20  116/82  10/15/20 136/89  09/08/20 132/89   Pulse Readings from Last 3 Encounters:  12/21/20 83  10/15/20 79  09/08/20 83   Wt Readings from Last 3 Encounters:  12/21/20 227 lb (103 kg)  11/25/20 226 lb (102.5 kg)  10/15/20 226 lb 6.4 oz (102.7 kg)    Assessment: Review of patient past medical history, allergies, medications, health status, including review of consultants reports, laboratory and other test data, was performed as part of comprehensive evaluation and provision of chronic care management services.   SDOH:  (Social Determinants of  Health) assessments and interventions performed:    CCM Care Plan  Allergies  Allergen Reactions   Sulfa Antibiotics Rash    Medications Reviewed Today     Reviewed by Lavera Guise, [facility] (Pharmacist) on 01/05/21 at [place] List Status: <None>   Medication Order Taking? Sig Documenting Provider Last Dose Status Informant  ascorbic acid (VITAMIN C) 500 MG tablet [id number] No Take 1 tablet (500 mg total) by mouth daily. Murlean Iba, MD Taking Active Self  aspirin EC 81 MG EC tablet [id number] No Take 1 tablet (81 mg total) by mouth daily. Cheryln Manly, NP Taking Active Self  atorvastatin (LIPITOR) 80 MG tablet [id number] No Take 1 tablet by mouth once daily Strader, [place] M, PA-C Taking Active   azelastine (ASTELIN) 0.1 % nasal spray [id number]  Place 1 spray into both nostrils 2 (two) times daily. Use in each nostril as directed Loman Brooklyn, FNP  Active   Budeson-Glycopyrrol-Formoterol (BREZTRI AEROSPHERE) 160-9-4.8 MCG/ACT AERO [id number] No Inhale 2 puffs into the lungs in the morning and at bedtime. [name], NP Taking Active Self  cetirizine (ZYRTEC) 10 MG tablet [id number]  Take 1 tablet (10 mg total) by mouth daily. Hendricks Limes F, FNP  Active   clopidogrel (PLAVIX) 75 MG tablet [id number] No Take 1 tablet by mouth once daily Strader, Brittany M, PA-C Taking Active   desvenlafaxine (PRISTIQ) 50 MG 24 hr tablet [id number]  Take 1 tablet (50 mg total) by mouth daily. Loman Brooklyn, FNP  Active   ezetimibe (ZETIA) 10 MG tablet [id number]  Take 1 tablet (10 mg total) by mouth daily. Loman Brooklyn, FNP  Active   furosemide (LASIX) 20 MG tablet [id number]  Take 1 tablet by mouth once daily Hendricks Limes F, FNP  Active   ipratropium-albuterol (DUONEB) 0.5-2.5 (3) MG/3ML SOLN [id number] No Take 3 mLs by nebulization every 8 (eight) hours as needed. Loman Brooklyn, FNP Taking Active   losartan (COZAAR) 50 MG tablet [id number] No Take 1 tablet (50 mg total) by  mouth daily. Murlean Iba, MD Taking Expired 07/01/20 2359 Self  metoprolol succinate (TOPROL-XL) 25 MG 24 hr tablet [id number] No Take 1 tablet by mouth once daily Strader, [place] M, PA-C Taking Active   nitroGLYCERIN (NITROSTAT) 0.4 MG SL tablet [id number] No Place 1 tablet (0.4 mg total) under the tongue every 5 (five) minutes x 3 doses as needed for chest pain. Cheryln Manly, NP Taking Active   pantoprazole (PROTONIX) 40 MG tablet [id number] No Take 1 tablet (40 mg total) by mouth daily. Loman Brooklyn, FNP Taking Active   potassium chloride (KLOR-CON) 10 MEQ tablet [id number] No Take 1 tablet (10 mEq total) by mouth daily. Loman Brooklyn, FNP Taking Active   VENTOLIN HFA 108 [phone] Base) MCG/ACT inhaler [id number] No Inhale 2 puffs into the lungs every 6 (six) hours as needed for wheezing or shortness of  breath. Sharion Balloon, FNP Taking Active             Patient Active Problem List   Diagnosis Date Noted   History of ST elevation myocardial infarction (STEMI) 12/22/2020   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 ([place]) 09/08/2020   Chronic combined systolic and diastolic heart failure ([place]) 03/31/2020   Elevated serum creatinine 03/31/2020   Macrocytosis 03/31/2020   Gastroesophageal reflux disease 01/02/2020   Erectile dysfunction 01/02/2020   Coronary artery disease involving 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 ([place]) 03/12/2017   COPD with chronic bronchitis and emphysema ([place]) 11/22/2015   Nicotine dependence, uncomplicated 50/11/3816   Aortic aneurysm ([place]) 2017   Essential hypertension, benign 01/04/2015   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   Hyperlipidemia with target LDL less than 70 12/07/2012    Immunization History  Administered Date(s) Administered   Tdap 01/06/2014    Conditions to be addressed/monitored: COPD  Care Plan : PHARMD MEDICATION MANAGEMENT  Updates made by Lavera Guise, [place] since 01/05/2021 12:00 AM      Problem: DISEASE PROGRESSION PREVENTION      Long-Range Goal: COPD   This Visit's Progress: Not on track  Priority: High  Note:   Current Barriers:  Unable to independently afford treatment regimen Unable to maintain control of COPD  Pharmacist Clinical Goal(s):  patient will verbalize ability to afford treatment regimen achieve control of COPD as evidenced by IMPROVED BREATHING & QUALITY OF LIFE maintain control of COPD as evidenced by [place]  through collaboration with PharmD and provider.    Interventions: 1:1 collaboration with Loman Brooklyn, FNP regarding development and update of comprehensive plan of care as evidenced by provider attestation and co-signature Inter-disciplinary care team collaboration (see longitudinal plan of care) Comprehensive medication review performed; medication list updated in electronic medical record  Chronic Obstructive Pulmonary Disease:  New goal. Uncontrolled; current treatment: Breztri (new start) ; Ventolin rescue, DuoNebs PRN Reports breztri samples are helping Only using rescue albuterol 1-2 times per day Smoking 1 ppd; not interested in cessation although offered GOLD Classification:  NOT STAGED; doesn't follow with pulm Most recent Pulmonary Function Testing:  n/a 1 exacerbations requiring treatment in the last 6 months  Assessed patient finances. Will apply [place] patient [place] for Judithann Sauger (will ship to patient's home); 3 weeks worth of samples given   Patient Goals/Self-Care Activities patient will:  - take medications as prescribed focus on medication adherence collaborate with provider on medication access solutions  Follow Up Plan: Telephone follow up appointment with care management team member scheduled for: 2 months      Medication Assistance: Application for [place] (BREZTRI)  medication assistance program. in process.  Anticipated assistance start date TBD.  See plan of  care for additional detail.  Patient's preferred pharmacy is:  [facility] [address], [address] [place] [address] Phone: [phone] Fax: [phone]   Follow Up:  Patient agrees to Care Plan and Follow-up.  Plan: Telephone follow up appointment with care management team member scheduled for:  2 MONTHS  Regina Eck, PharmD, BCPS Clinical Pharmacist, [place]  [place] Phone [phone]

## 2020-12-21 ENCOUNTER — Ambulatory Visit (INDEPENDENT_AMBULATORY_CARE_PROVIDER_SITE_OTHER): Payer: Medicare HMO | Admitting: Family Medicine

## 2020-12-21 ENCOUNTER — Encounter: Payer: Self-pay | Admitting: Family Medicine

## 2020-12-21 ENCOUNTER — Other Ambulatory Visit: Payer: Self-pay

## 2020-12-21 VITALS — BP 116/82 | HR 83 | Temp 97.9°F | Ht 73.0 in | Wt 227.0 lb

## 2020-12-21 DIAGNOSIS — J449 Chronic obstructive pulmonary disease, unspecified: Secondary | ICD-10-CM

## 2020-12-21 DIAGNOSIS — I5042 Chronic combined systolic (congestive) and diastolic (congestive) heart failure: Secondary | ICD-10-CM

## 2020-12-21 DIAGNOSIS — K2100 Gastro-esophageal reflux disease with esophagitis, without bleeding: Secondary | ICD-10-CM | POA: Diagnosis not present

## 2020-12-21 DIAGNOSIS — I252 Old myocardial infarction: Secondary | ICD-10-CM | POA: Diagnosis not present

## 2020-12-21 DIAGNOSIS — E785 Hyperlipidemia, unspecified: Secondary | ICD-10-CM

## 2020-12-21 DIAGNOSIS — Z951 Presence of aortocoronary bypass graft: Secondary | ICD-10-CM

## 2020-12-21 DIAGNOSIS — F419 Anxiety disorder, unspecified: Secondary | ICD-10-CM

## 2020-12-21 DIAGNOSIS — F332 Major depressive disorder, recurrent severe without psychotic features: Secondary | ICD-10-CM

## 2020-12-21 DIAGNOSIS — I10 Essential (primary) hypertension: Secondary | ICD-10-CM

## 2020-12-21 DIAGNOSIS — I25709 Atherosclerosis of coronary artery bypass graft(s), unspecified, with unspecified angina pectoris: Secondary | ICD-10-CM

## 2020-12-21 MED ORDER — AZELASTINE HCL 0.1 % NA SOLN: 1.0000 | Freq: Two times a day (BID) | NASAL | 5 refills | Status: DC

## 2020-12-21 MED ORDER — EZETIMIBE 10 MG PO TABS: 10.0000 mg | ORAL_TABLET | Freq: Every day | ORAL | 2 refills | Status: DC

## 2020-12-21 MED ORDER — CETIRIZINE HCL 10 MG PO TABS: 10.0000 mg | ORAL_TABLET | Freq: Every day | ORAL | 2 refills | Status: DC

## 2020-12-21 MED ORDER — DESVENLAFAXINE SUCCINATE ER 50 MG PO TB24
50.0000 mg | ORAL_TABLET | Freq: Every day | ORAL | 2 refills | Status: DC
Start: 2020-12-21 — End: 2021-03-30
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
## 2020-12-22 ENCOUNTER — Encounter: Payer: Self-pay | Admitting: Family Medicine

## 2020-12-22 ENCOUNTER — Other Ambulatory Visit: Payer: Self-pay | Admitting: Family Medicine

## 2020-12-22 ENCOUNTER — Telehealth: Payer: Self-pay | Admitting: Family Medicine

## 2020-12-22 DIAGNOSIS — I252 Old myocardial infarction: Secondary | ICD-10-CM | POA: Insufficient documentation

## 2020-12-22 DIAGNOSIS — E875 Hyperkalemia: Secondary | ICD-10-CM

## 2020-12-22 LAB — CMP14+EGFR
ALT: 24 IU/L (ref 0–44)
AST: 29 IU/L (ref 0–40)
Albumin/Globulin Ratio: 1.9 (ref 1.2–2.2)
Albumin: 4.9 g/dL — ABNORMAL HIGH (ref 3.8–4.8)
Alkaline Phosphatase: 99 IU/L (ref 44–121)
BUN/Creatinine Ratio: 9 — ABNORMAL LOW (ref 10–24)
BUN: 11 mg/dL (ref 8–27)
Bilirubin Total: 0.7 mg/dL (ref 0.0–1.2)
CO2: 25 mmol/L (ref 20–29)
Calcium: 10.1 mg/dL (ref 8.6–10.2)
Chloride: 91 mmol/L — ABNORMAL LOW (ref 96–106)
Creatinine, Ser: 1.21 mg/dL (ref 0.76–1.27)
Globulin, Total: 2.6 g/dL (ref 1.5–4.5)
Glucose: 96 mg/dL (ref 70–99)
Potassium: 5.9 mmol/L (ref 3.5–5.2)
Sodium: 133 mmol/L — ABNORMAL LOW (ref 134–144)
Total Protein: 7.5 g/dL (ref 6.0–8.5)
eGFR: 67 mL/min/{1.73_m2} (ref >59)

## 2020-12-22 LAB — LIPID PANEL
Chol/HDL Ratio: 2.3 ratio (ref 0.0–5.0)
Cholesterol, Total: 210 mg/dL — ABNORMAL HIGH (ref 100–199)
HDL: 92 mg/dL (ref >39)
LDL Chol Calc (NIH): 104 mg/dL — ABNORMAL HIGH (ref 0–99)
Triglycerides: 82 mg/dL (ref 0–149)
VLDL Cholesterol Cal: 14 mg/dL (ref 5–40)

## 2020-12-22 LAB — CBC WITH DIFFERENTIAL/PLATELET
Basophils Absolute: 0.1 10*3/uL (ref 0.0–0.2)
Basos: 1 %
EOS (ABSOLUTE): 0.2 10*3/uL (ref 0.0–0.4)
Eos: 4 %
Hematocrit: 49.2 % (ref 37.5–51.0)
Hemoglobin: 17.3 g/dL (ref 13.0–17.7)
Immature Grans (Abs): 0 10*3/uL (ref 0.0–0.1)
Immature Granulocytes: 1 %
Lymphocytes Absolute: 1.7 10*3/uL (ref 0.7–3.1)
Lymphs: 27 %
MCH: 32.8 pg (ref 26.6–33.0)
MCHC: 35.2 g/dL (ref 31.5–35.7)
MCV: 93 fL (ref 79–97)
Monocytes Absolute: 0.5 10*3/uL (ref 0.1–0.9)
Monocytes: 8 %
Neutrophils Absolute: 3.8 10*3/uL (ref 1.4–7.0)
Neutrophils: 59 %
Platelets: 282 10*3/uL (ref 150–450)
RBC: 5.27 x10E6/uL (ref 4.14–5.80)
RDW: 12 % (ref 11.6–15.4)
WBC: 6.2 10*3/uL (ref 3.4–10.8)

## 2020-12-22 LAB — BRAIN NATRIURETIC PEPTIDE: BNP: 114.6 pg/mL — ABNORMAL HIGH (ref 0.0–100.0)

## 2020-12-22 NOTE — Telephone Encounter (Signed)
Addressed in labs results.

## 2020-12-22 NOTE — Progress Notes (Signed)
Pt r/s about lab results.

## 2020-12-31 NOTE — Addendum Note (Signed)
Addended by: Deliah Boston F on: 12/31/2020 01:15 PM   Modules accepted: Orders

## 2021-01-03 ENCOUNTER — Other Ambulatory Visit: Payer: Self-pay | Admitting: Family Medicine

## 2021-01-03 DIAGNOSIS — I5043 Acute on chronic combined systolic (congestive) and diastolic (congestive) heart failure: Secondary | ICD-10-CM

## 2021-01-04 ENCOUNTER — Other Ambulatory Visit: Payer: Self-pay | Admitting: Nurse Practitioner

## 2021-01-04 DIAGNOSIS — J441 Chronic obstructive pulmonary disease with (acute) exacerbation: Secondary | ICD-10-CM

## 2021-01-04 DIAGNOSIS — J449 Chronic obstructive pulmonary disease, unspecified: Secondary | ICD-10-CM

## 2021-01-05 NOTE — Patient Instructions (Signed)
Visit Information  PATIENT GOALS:  Goals Addressed               This Visit's Progress     Patient Stated     COPD PHARMD GOAL (pt-stated)        Current Barriers:  Unable to independently afford treatment regimen Unable to maintain control of COPD  Pharmacist Clinical Goal(s):  patient will verbalize ability to afford treatment regimen achieve control of COPD as evidenced by IMPROVED BREATHING & QUALITY OF LIFE maintain control of COPD as evidenced by IMPROVED BREATHING & QUALITY OF LIFE  through collaboration with PharmD and provider.    Interventions: 1:1 collaboration with [name], FNP regarding development and update of comprehensive plan of care as evidenced by provider attestation and co-signature Inter-disciplinary care team collaboration (see longitudinal plan of care) Comprehensive medication review performed; medication list updated in electronic medical record  Chronic Obstructive Pulmonary Disease:  New goal. Uncontrolled; current treatment: Breztri (new start) ; Ventolin rescue, DuoNebs PRN Reports breztri samples are helping Only using rescue albuterol 1-2 times per day Smoking 1 ppd; not interested in cessation although offered GOLD Classification:  NOT STAGED; doesn't follow with pulm Most recent Pulmonary Function Testing:  n/a 1 exacerbations requiring treatment in the last 6 months  Assessed patient finances. [place] for Kevin Smith (will ship to patient's home); 3 weeks worth of samples given   Patient Goals/Self-Care Activities patient will:  - take medications as prescribed focus on medication adherence collaborate with provider on medication access solutions  Follow Up Plan: Telephone follow up appointment with care management team member scheduled for: 2 months         The patient verbalized understanding of instructions, educational materials, and care plan provided today and declined offer to  receive copy of patient instructions, educational materials, and care plan.   Telephone follow up appointment with care management team member scheduled for:  Signature Kieth Brightly, PharmD, BCPS Clinical Pharmacist, [facility]  II Phone [phone]

## 2021-01-10 DIAGNOSIS — J449 Chronic obstructive pulmonary disease, unspecified: Secondary | ICD-10-CM | POA: Diagnosis not present

## 2021-02-08 ENCOUNTER — Other Ambulatory Visit: Payer: Self-pay | Admitting: Family Medicine

## 2021-02-11 ENCOUNTER — Ambulatory Visit (INDEPENDENT_AMBULATORY_CARE_PROVIDER_SITE_OTHER): Payer: Medicare HMO | Admitting: Nurse Practitioner

## 2021-02-11 ENCOUNTER — Encounter: Payer: Self-pay | Admitting: Nurse Practitioner

## 2021-02-11 DIAGNOSIS — M25512 Pain in left shoulder: Secondary | ICD-10-CM | POA: Insufficient documentation

## 2021-02-11 DIAGNOSIS — R059 Cough, unspecified: Secondary | ICD-10-CM | POA: Diagnosis not present

## 2021-02-11 MED ORDER — DICLOFENAC SODIUM 1 % EX GEL: 2.0000 g | Freq: Four times a day (QID) | CUTANEOUS | 0 refills | Status: DC

## 2021-02-11 MED ORDER — BENZONATATE 100 MG PO CAPS: 100.0000 mg | ORAL_CAPSULE | Freq: Three times a day (TID) | ORAL | 0 refills | Status: DC | PRN

## 2021-02-11 NOTE — Progress Notes (Signed)
Virtual Visit  Note Due to COVID-19 pandemic this visit was conducted virtually. This visit type was conducted due to national recommendations for restrictions regarding the COVID-19 Pandemic (e.g. social distancing, sheltering in place) in an effort to limit this patient's exposure and mitigate transmission in our community. All issues noted in this document were discussed and addressed.  A physical exam was not performed with this format.  I connected with Kevin Smith on 02/11/21 at 2:pm  by telephone and verified that I am speaking with the correct person using two identifiers. Kevin Smith is currently located at home during visit. The provider, Daryll Drown, NP is located in their office at time of visit.  I discussed the limitations, risks, security and privacy concerns of performing an evaluation and management service by telephone and the availability of in person appointments. I also discussed with the patient that there may be a patient responsible charge related to this service. The patient expressed understanding and agreed to proceed.   History and Present Illness:  Shoulder Pain  The pain is present in the left shoulder. This is a new problem. The current episode started yesterday. The problem occurs intermittently. The problem has been unchanged. The quality of the pain is described as aching. The pain is at a severity of 5/10. The pain is moderate. Pertinent negatives include no fever. The symptoms are aggravated by activity (coughing). He has tried nothing for the symptoms. The treatment provided no relief.  Cough This is a new problem. Episode onset: in the past few days. The problem has been unchanged. The problem occurs constantly. The cough is Non-productive. Pertinent negatives include no ear congestion, ear pain, fever, nasal congestion, rash or sore throat. Nothing aggravates the symptoms. He has tried nothing for the symptoms. The treatment provided no relief.      Review of Systems  Constitutional:  Negative for fever.  HENT: Negative.  Negative for ear pain and sore throat.   Respiratory:  Positive for cough.   Genitourinary: Negative.   Musculoskeletal: Negative.        Left shoulder pain  Skin: Negative.  Negative for rash.  Psychiatric/Behavioral:  The patient is not nervous/anxious.   All other systems reviewed and are negative.   Observations/Objective: Tele visit  Assessment and Plan:  Cough Cough symptoms not well controlled. Take meds as prescribed - Use a cool mist humidifier  -Use saline nose sprays frequently -Force fluids -For fever or aches or pains- take Tylenol or ibuprofen. -Benzonatate 100 mg tablet by mouth -If symptoms do not improve, she may need to be COVID tested to rule this out Follow up with worsening unresolved symptoms   Acute pain of left shoulder -Acute left shoulder pain not well controlled. -Voltaren topical gel as needed. -Tylenol for pain -Follow-up with unresolved symptoms.    Follow Up Instructions: Follow up with unresolved symptoms    I discussed the assessment and treatment plan with the patient. The patient was provided an opportunity to ask questions and all were answered. The patient agreed with the plan and demonstrated an understanding of the instructions.   The patient was advised to call back or seek an in-person evaluation if the symptoms worsen or if the condition fails to improve as anticipated.  The above assessment and management plan was discussed with the patient. The patient verbalized understanding of and has agreed to the management plan. Patient is aware to call the clinic if symptoms persist or worsen. Patient is aware when  to return to the clinic for a follow-up visit. Patient educated on when it is appropriate to go to the emergency department.   Time call ended:  12:50 pm  I provided 12 minutes of  non face-to-face time during this encounter.    Daryll Drown, NP

## 2021-02-11 NOTE — Assessment & Plan Note (Addendum)
-  Acute left shoulder pain not well controlled. -Voltaren topical gel as needed. -Tylenol for pain -Follow-up with unresolved symptoms.

## 2021-02-11 NOTE — Assessment & Plan Note (Signed)
Cough symptoms not well controlled. Take meds as prescribed - Use a cool mist humidifier  -Use saline nose sprays frequently -Force fluids -For fever or aches or pains- take Tylenol or ibuprofen. -Benzonatate 100 mg tablet by mouth -If symptoms do not improve, she may need to be COVID tested to rule this out Follow up with worsening unresolved symptoms

## 2021-02-24 ENCOUNTER — Other Ambulatory Visit (HOSPITAL_COMMUNITY): Payer: Self-pay

## 2021-03-15 ENCOUNTER — Other Ambulatory Visit: Payer: Self-pay | Admitting: Physician Assistant

## 2021-03-21 ENCOUNTER — Other Ambulatory Visit: Payer: Self-pay | Admitting: Nurse Practitioner

## 2021-03-21 ENCOUNTER — Telehealth: Payer: Self-pay | Admitting: Family Medicine

## 2021-03-21 ENCOUNTER — Ambulatory Visit (INDEPENDENT_AMBULATORY_CARE_PROVIDER_SITE_OTHER): Payer: Medicare HMO | Admitting: Nurse Practitioner

## 2021-03-21 ENCOUNTER — Encounter: Payer: Self-pay | Admitting: Nurse Practitioner

## 2021-03-21 VITALS — BP 140/78 | HR 77 | Temp 97.8°F | Resp 20 | Ht 73.0 in | Wt 229.0 lb

## 2021-03-21 DIAGNOSIS — M5412 Radiculopathy, cervical region: Secondary | ICD-10-CM | POA: Diagnosis not present

## 2021-03-21 DIAGNOSIS — R059 Cough, unspecified: Secondary | ICD-10-CM

## 2021-03-21 DIAGNOSIS — J449 Chronic obstructive pulmonary disease, unspecified: Secondary | ICD-10-CM | POA: Diagnosis not present

## 2021-03-21 DIAGNOSIS — M25512 Pain in left shoulder: Secondary | ICD-10-CM

## 2021-03-21 MED ORDER — TESSALON PERLES 100 MG PO CAPS: 100.0000 mg | ORAL_CAPSULE | Freq: Three times a day (TID) | ORAL | 0 refills | Status: DC | PRN

## 2021-03-21 MED ORDER — DICLOFENAC SODIUM 1 % EX GEL: 2.0000 g | Freq: Four times a day (QID) | CUTANEOUS | 0 refills | Status: DC

## 2021-03-21 MED ORDER — PREDNISONE 10 MG (21) PO TBPK: ORAL_TABLET | ORAL | 0 refills | Status: DC

## 2021-03-21 MED ORDER — CETIRIZINE HCL 10 MG PO TABS: 10.0000 mg | ORAL_TABLET | Freq: Every day | ORAL | 2 refills | Status: DC

## 2021-03-21 MED ORDER — CYCLOBENZAPRINE HCL 5 MG PO TABS: 5.0000 mg | ORAL_TABLET | Freq: Three times a day (TID) | ORAL | 1 refills | Status: DC | PRN

## 2021-03-21 NOTE — Patient Instructions (Signed)
Cervical Radiculopathy ?Cervical radiculopathy means that a nerve in the neck (a cervical nerve) is pinched or bruised. This can happen because of an injury to the cervical spine (vertebrae) in the neck, or as a normal part of getting older. This condition can cause pain or loss of feeling (numbness) that runs from your neck all the way down to your arm and fingers. Often, this condition gets better with rest. Treatment may be needed if the condition does not get better. ?What are the causes? ?A neck injury. ?A bulging disk in your spine. ?Sudden muscle tightening (muscle spasms). ?Tight muscles in your neck due to overuse. ?Arthritis. ?Breakdown in the bones and joints of the spine (spondylosis) due to getting older. ?Bone spurs that form near the nerves in the neck. ?What are the signs or symptoms? ?Pain. The pain may: ?Run from the neck to the arm and hand. ?Be very bad or irritating. ?Get worse when you move your neck. ?Loss of feeling or tingling in your arm or hand. ?Weakness in your arm or hand, in very bad cases. ?How is this treated? ?In many cases, treatment is not needed for this condition. With rest, the condition often gets better over time. If treatment is needed, options may include: ?Wearing a soft neck collar (cervical collar) for short periods of time. ?Doing exercises (physical therapy) to strengthen your neck muscles. ?Taking medicines. ?Having shots (injections) in your spine, in very bad cases. ?Having surgery. This may be needed if other treatments do not help. The type of surgery that is used will depend on the cause of your condition. ?Follow these instructions at home: ?If you have a soft neck collar: ?Wear it as told by your doctor. Take it off only as told by your doctor. ?Ask your doctor if you can take the collar off for cleaning and bathing. If you are allowed to take the collar off for cleaning or bathing: ?Follow instructions from your doctor about how to take off the collar  safely. ?Clean the collar by wiping it with mild soap and water and drying it completely. ?Take out any removable pads in the collar every 1-2 days. Wash them by hand with soap and water. Let them air-dry completely before you put them back in the collar. ?Check your skin under the collar for redness or sores. If you see any, tell your doctor. ?Managing pain ?  ?Take over-the-counter and prescription medicines only as told by your doctor. ?If told, put ice on the painful area. To do this: ?If you have a soft neck collar, take if off as told by your doctor. ?Put ice in a plastic bag. ?Place a towel between your skin and the bag. ?Leave the ice on for 20 minutes, 2-3 times a day. ?Take off the ice if your skin turns bright red. This is very important. If you cannot feel pain, heat, or cold, you have a greater risk of damage to the area. ?If using ice does not help, you can try using heat. Use the heat source that your doctor recommends, such as a moist heat pack or a heating pad. ?Place a towel between your skin and the heat source. ?Leave the heat on for 20-30 minutes. ?Take off the heat if your skin turns bright red. This is very important. If you cannot feel pain, heat, or cold, you have a greater risk of getting burned. ?You may try a gentle neck and shoulder rub (massage). ?Activity ?Rest as needed. ?Return to your normal   activities when your doctor says that it is safe. ?Do exercises as told by your doctor or physical therapist. ?You may have to avoid lifting. Ask your doctor how much you can safely lift. ?General instructions ?Use a flat pillow when you sleep. ?Do not drive while wearing a soft neck collar. If you do not have a soft neck collar, ask your doctor if it is safe to drive while your neck heals. ?Ask your doctor if you should avoid driving or using machines while you are taking your medicine. ?Do not smoke or use any products that contain nicotine or tobacco. If you need help quitting, ask your  doctor. ?Keep all follow-up visits. ?Contact a doctor if: ?Your condition does not get better with treatment. ?Get help right away if: ?Your pain gets worse and medicine does not help. ?You lose feeling or feel weak in your hand, arm, face, or leg. ?You have a high fever. ?Your neck is stiff. ?You cannot control when you poop or pee (have incontinence). ?You have trouble with walking, balance, or talking. ?Summary ?Cervical radiculopathy means that a nerve in the neck is pinched or bruised. ?A nerve can get pinched from a bulging disk, arthritis, an injury to the neck, or other causes. ?Symptoms include pain, tingling, or loss of feeling that goes from the neck to the arm or hand. ?Weakness in your arm or hand can happen in very bad cases. ?Treatment may include resting, wearing a soft neck collar, and doing exercises. You might need to take medicines for pain. In very bad cases, shots or surgery may be needed. ?This information is not intended to replace advice given to you by your health care provider. Make sure you discuss any questions you have with your health care provider. ?Document Revised: 09/02/2020 Document Reviewed: 09/02/2020 ?Elsevier Patient Education ? 2022 Elsevier Inc. ? ?

## 2021-03-21 NOTE — Assessment & Plan Note (Signed)
Neck and shoulder pain unresolved in the past few weeks.  Advised patient to rest neck joint depending on work activities.  Flexeril 5 mg tablet by mouth as needed.  Prednisone taper, warm compress or ice as tolerated.  Advised patient to follow-up with unresolved symptoms.  In the future patient may need x-ray and other imaging to help diagnose. Physical therapy will not be a bad idea.  Patient presents with stiffness and limited range of motion in left arm.
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
## 2021-03-21 NOTE — Assessment & Plan Note (Signed)
Cough not well controlled.Encourage patient on smoking cessation.  Tessalon Perls 100 mg tablet by mouth: Educated patient that sometimes cough can take a while to resolve anywhere from 4 to 6 weeks.  Follow-up with unresolved symptoms

## 2021-03-22 ENCOUNTER — Ambulatory Visit (INDEPENDENT_AMBULATORY_CARE_PROVIDER_SITE_OTHER): Payer: Medicare HMO | Admitting: Pharmacist

## 2021-03-22 VITALS — BP 138/75

## 2021-03-22 DIAGNOSIS — J4489 Other specified chronic obstructive pulmonary disease: Secondary | ICD-10-CM

## 2021-03-22 DIAGNOSIS — J449 Chronic obstructive pulmonary disease, unspecified: Secondary | ICD-10-CM

## 2021-03-22 DIAGNOSIS — I10 Essential (primary) hypertension: Secondary | ICD-10-CM

## 2021-03-22 NOTE — Patient Instructions (Signed)
Visit Information  Thank you for taking time to visit with me today. Please don't hesitate to contact me if I can be of assistance to you before our next scheduled telephone appointment.  Following are the goals we discussed today:  Current Barriers:  Unable to independently afford treatment regimen Unable to maintain control of COPD  Pharmacist Clinical Goal(s):  patient will verbalize ability to afford treatment regimen achieve control of COPD as evidenced by IMPROVED BREATHING & QUALITY OF LIFE maintain control of COPD as evidenced by [place]  through collaboration with PharmD and provider.   Interventions: 1:1 collaboration with Loman Brooklyn, FNP regarding development and update of comprehensive plan of care as evidenced by provider attestation and co-signature Inter-disciplinary care team collaboration (see longitudinal plan of care) Comprehensive medication review performed; medication list updated in electronic medical record  Chronic Obstructive Pulmonary Disease:  New goal. Controlled; current treatment: Breztri ; Ventolin rescue, DuoNebs PRN Reports breztri samples are helping Only using rescue albuterol (ventolin) 1-2 times per day Smoking 1 ppd; declines cessation although offered GOLD Classification:  NOT STAGED; doesn't follow with pulm Most recent Pulmonary Function Testing:  n/a 1 exacerbations requiring treatment in the last 6 months  Assessed patient finances. [place] Judithann Sauger (will ship to patient's home); 2 weeks worth of samples given  Hypertension: -medications: LOSARTAN, METOP -at goal <140/80 -smoking cessation would aid in improved hypertension control & cardiac history  Patient Goals/Self-Care Activities patient will:  - take medications as prescribed focus on medication adherence collaborate with provider on medication access solutions  Follow Up Plan: Telephone  follow up appointment with care management team member scheduled for: 2 months   Please call the care guide team at [phone] if you need to cancel or reschedule your appointment.    The patient verbalized understanding of instructions, educational materials, and care plan provided today and declined offer to receive copy of patient instructions, educational materials, and care plan.   Signature Regina Eck, PharmD, BCPS Clinical Pharmacist, [facility] Phone [phone]

## 2021-03-22 NOTE — Progress Notes (Signed)
Chronic Care Management Pharmacy Note  03/22/2021 Name:  Kevin Smith MRN:  [id number] DOB:  Jul 17, 1955  Summary: COPD, HTN  Recommendations/Changes made from today's visit:  Chronic Obstructive Pulmonary Disease:  New goal. Controlled; current treatment: Breztri ; Ventolin rescue, DuoNebs PRN Reports breztri samples are helping Only using rescue albuterol (ventolin) 1-2 times per day Smoking 1 ppd; declines cessation although offered GOLD Classification:  NOT STAGED; doesn't follow with pulm Most recent Pulmonary Function Testing:  n/a 1 exacerbations requiring treatment in the last 6 months  Assessed patient finances. [place] for Kevin Smith (will ship to patient's home); 2 weeks worth of samples given  Hypertension: -medications: LOSARTAN, METOP -at goal <140/80 -smoking cessation would aid in improved hypertension control & cardiac history  Patient Goals/Self-Care Activities patient will:  - take medications as prescribed focus on medication adherence collaborate with provider on medication access solutions  Follow Up Plan: Telephone follow up appointment with care management team member scheduled for: 2 months  Subjective: Kevin Smith is an 66 y.o. year old male who is a primary patient of Kevin Smith, Kevin Smith.  The CCM team was consulted for assistance with disease management and care coordination needs.    Engaged with patient by telephone for follow up visit in response to provider referral for pharmacy case management and/or care coordination services.   Consent to Services:  The patient was given information about Chronic Care Management services, agreed to services, and gave verbal consent prior to initiation of services.  Please see initial visit note for detailed documentation.   Patient Care Team: Kevin Brooklyn, FNP as PCP - General (Family Medicine) Kevin Mocha, MD as Consulting Physician  (Cardiology) Kevin Smith, [facility] (Pharmacist)  Objective:  Lab Results  Component Value Date   CREATININE 1.21 12/21/2020   CREATININE 1.25 09/08/2020   CREATININE 1.28 (H) 04/16/2020    Lab Results  Component Value Date   HGBA1C 5.4 09/16/2018   Last diabetic Eye exam: No results found for: HMDIABEYEEXA  Last diabetic Foot exam: No results found for: HMDIABFOOTEX      Component Value Date/Time   CHOL 210 (H) 12/21/2020 1520   TRIG 82 12/21/2020 1520   HDL 92 12/21/2020 1520   CHOLHDL 2.3 12/21/2020 1520   CHOLHDL 2.8 09/16/2018 0504   VLDL 17 09/16/2018 0504   LDLCALC 104 (H) 12/21/2020 1520    Hepatic Function Latest Ref Rng & Units 12/21/2020 09/08/2020 04/16/2020  Total Protein 6.0 - 8.5 g/dL 7.5 7.3 6.9  Albumin 3.8 - 4.8 g/dL 4.9(H) 4.7 3.7  AST 0 - 40 IU/L 29 24 23   ALT 0 - 44 IU/L 24 28 23   Alk Phosphatase 44 - 121 IU/L 99 93 63  Total Bilirubin 0.0 - 1.2 mg/dL 0.7 0.6 1.0    Lab Results  Component Value Date/Time   TSH 2.760 09/08/2020 11:17 AM    CBC Latest Ref Rng & Units 12/21/2020 04/16/2020 04/02/2020  WBC 3.4 - 10.8 x10E3/uL 6.2 9.0 6.9  Hemoglobin 13.0 - 17.7 g/dL 17.3 14.2 14.1  Hematocrit 37.5 - 51.0 % 49.2 42.1 42.4  Platelets 150 - 450 x10E3/uL 282 182 227    No results found for: VD25OH  Clinical ASCVD: Yes  The ASCVD Risk score (Arnett DK, et al., 2019) failed to calculate for the following reasons:   The patient has a prior MI or stroke diagnosis    Other: (CHADS2VASc if Afib, PHQ9 if depression, MMRC or CAT  for COPD, ACT, DEXA)  Social History   Tobacco Use  Smoking Status Every Day   Packs/day: 1.00   Types: Cigarettes  Smokeless Tobacco Former   Types: Chew   BP Readings from Last 3 Encounters:  03/21/21 140/78  12/21/20 116/82  10/15/20 136/89   Pulse Readings from Last 3 Encounters:  03/21/21 77  12/21/20 83  10/15/20 79   Wt Readings from Last 3 Encounters:  03/21/21 229 lb (103.9 kg)  12/21/20 227 lb (103 kg)   11/25/20 226 lb (102.5 kg)    Assessment: Review of patient past medical history, allergies, medications, health status, including review of consultants reports, laboratory and other test data, was performed as part of comprehensive evaluation and provision of chronic care management services.   SDOH:  (Social Determinants of Health) assessments and interventions performed:    CCM Care Plan  Allergies  Allergen Reactions   Sulfa Antibiotics Rash    Medications Reviewed Today     Reviewed by Kevin Smith, [facility] (Pharmacist) on 03/22/21 at [id number]  Med List Status: <None>   Medication Order Taking? Sig Documenting Provider Last Dose Status Informant  ascorbic acid (VITAMIN C) 500 MG tablet [id number] No Take 1 tablet (500 mg total) by mouth daily. Murlean Iba, MD Taking Active Self  aspirin EC 81 MG EC tablet [id number] No Take 1 tablet (81 mg total) by mouth daily. Kevin Manly, NP Taking Active Self  atorvastatin (LIPITOR) 80 MG tablet [id number] No Take 1 tablet by mouth once daily Smith, Kevin M, PA-C Taking Active   azelastine (ASTELIN) 0.1 % nasal spray [id number] No Place 1 spray into both nostrils 2 (two) times daily. Use in each nostril as directed Kevin Brooklyn, FNP Taking Active   Budeson-Glycopyrrol-Formoterol (BREZTRI AEROSPHERE) 160-9-4.8 MCG/ACT AERO [id number] No Inhale 2 puffs into the lungs in the morning and at bedtime. Kevin Lynn, NP Taking Active Self           Med Note Blanca Smith, Kevin Macadamia   Tue Mar 22, 2021  9:46 AM) Can't afford--giving samples working on patient assistance  cetirizine (ZYRTEC) 10 MG tablet [id number]  Take 1 tablet (10 mg total) by mouth daily. Kevin Lynn, NP  Active   clopidogrel (PLAVIX) 75 MG tablet [id number] No Take 1 tablet by mouth once daily Smith, Kevin M, PA-C Taking Active   cyclobenzaprine (FLEXERIL) 5 MG tablet [id number]  Take 1 tablet (5 mg total) by mouth 3 (three) times daily as needed for muscle spasms.  Kevin Lynn, NP  Active   desvenlafaxine (PRISTIQ) 50 MG 24 hr tablet [id number] No Take 1 tablet (50 mg total) by mouth daily. Kevin Brooklyn, FNP Taking Active   diclofenac Sodium (VOLTAREN) 1 % GEL [id number]  Apply 2 g topically 4 (four) times daily. Kevin Lynn, NP  Active   ezetimibe (ZETIA) 10 MG tablet [id number] No Take 1 tablet (10 mg total) by mouth daily. Kevin Brooklyn, FNP Taking Active   furosemide (LASIX) 20 MG tablet [id number] No Take 1 tablet by mouth once daily Hendricks Limes F, FNP Taking Active   ipratropium-albuterol (DUONEB) 0.5-2.5 (3) MG/3ML SOLN [id number] No Take 3 mLs by nebulization every 8 (eight) hours as needed. Kevin Brooklyn, FNP Taking Active   losartan (COZAAR) 50 MG tablet [id number] No Take 1 tablet by mouth once daily Smith, Brittany M, PA-C Taking Active   metoprolol succinate (TOPROL-XL) 25 MG 24 hr tablet [id number] No Take 1 tablet by  mouth once daily Ahmed Prima, Kevin M, PA-C Taking Active   nitroGLYCERIN (NITROSTAT) 0.4 MG SL tablet [id number] No Place 1 tablet (0.4 mg total) under the tongue every 5 (five) minutes x 3 doses as needed for chest pain. Kevin Manly, NP Taking Active   pantoprazole (PROTONIX) 40 MG tablet [id number] No Take 1 tablet by mouth once daily Kevin Brooklyn, FNP Taking Active   potassium chloride (KLOR-CON) 10 MEQ tablet [id number] No Take 1 tablet (10 mEq total) by mouth daily.  Patient not taking: Reported on 03/21/2021   Kevin Brooklyn, FNP Not Taking Active   predniSONE (STERAPRED UNI-PAK 21 TAB) 10 MG (21) TBPK tablet [id number]  6 tablet day 1, 5 tablet day 2, 4 tablet day 3, 3 tablet day 4, 2 tablet day 5, 1 tablet day 6 Kevin Lynn, NP  Active   TESSALON PERLES 100 MG capsule [id number]  Take 1 capsule (100 mg total) by mouth 3 (three) times daily as needed for cough. 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 ([place]) 09/08/2020   Chronic combined systolic and diastolic heart failure ([place]) 03/31/2020   Elevated serum creatinine 03/31/2020   Macrocytosis 03/31/2020   Gastroesophageal reflux disease 01/02/2020   Erectile dysfunction 01/02/2020   Coronary artery disease involving 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 ([place]) 03/12/2017   COPD with chronic bronchitis and emphysema ([place]) 11/22/2015   Nicotine dependence, uncomplicated 26/37/8588   Aortic aneurysm ([place]) 2017   Essential hypertension, benign 01/04/2015   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   Hyperlipidemia with target LDL less than 70 12/07/2012   Cough 09/12/2010    Immunization History  Administered Date(s) Administered   Tdap 01/06/2014    Conditions to be addressed/monitored: HTN and COPD  Care Plan : PHARMD MEDICATION MANAGEMENT  Updates made by Kevin Smith, RPH since 03/22/2021 12:00 AM     Problem: DISEASE PROGRESSION PREVENTION      Long-Range Goal: COPD, HTN PHARMD GOAL   Recent Progress: Not on track  Priority: High  Note:   Current Barriers:  Unable to independently afford treatment regimen Unable to maintain control of COPD  Pharmacist Clinical Goal(s):  patient will verbalize ability to afford treatment regimen achieve control of COPD as evidenced by IMPROVED BREATHING & QUALITY OF LIFE maintain control of COPD as evidenced by [place]  through collaboration with PharmD and provider.   Interventions: 1:1 collaboration with Kevin Brooklyn, FNP regarding development and update of comprehensive plan of care as evidenced by  provider attestation and co-signature Inter-disciplinary care team collaboration (see longitudinal plan of care) Comprehensive medication review performed; medication list updated in electronic medical record  Chronic Obstructive Pulmonary Disease:  New goal. Controlled; current treatment: Breztri ; Ventolin rescue, DuoNebs PRN Reports breztri samples are helping Only using rescue albuterol (ventolin) 1-2 times per day Smoking 1 ppd; declines cessation although offered GOLD Classification:  NOT STAGED; doesn't follow with pulm Most recent Pulmonary Function  Testing:  n/a 1 exacerbations requiring treatment in the last 6 months  Assessed patient finances. [place] for Kevin Smith (will ship to patient's home); 2 weeks worth of samples given  Hypertension: -medications: LOSARTAN, METOP -at goal <140/80 -smoking cessation would aid in improved hypertension control & cardiac history  Patient Goals/Self-Care Activities patient will:  - take medications as prescribed focus on medication adherence collaborate with provider on medication access solutions  Follow Up Plan: Telephone follow up appointment with care management team member scheduled for: 2 months      Medication Assistance: Application for [place]  medication assistance program. in process.  Anticipated assistance start date tbd.  See plan of care for additional detail.  Patient's preferred pharmacy is:  [facility] [address], [place] [address] [place] [address] Phone: [phone] Fax: [phone]   Follow Up:  Patient agrees to Care Plan and Follow-up.  Plan: Telephone follow up appointment with care management team member scheduled for:  2 months   Regina Eck, PharmD, BCPS Clinical Pharmacist, [place]  [facility] Phone [phone]

## 2021-03-22 NOTE — Telephone Encounter (Signed)
Left message advising requested refill was sent in and to call back with any further questions or concerns.

## 2021-03-24 ENCOUNTER — Other Ambulatory Visit (HOSPITAL_COMMUNITY): Payer: Self-pay

## 2021-03-24 ENCOUNTER — Other Ambulatory Visit: Payer: Self-pay | Admitting: Family Medicine

## 2021-03-24 DIAGNOSIS — J449 Chronic obstructive pulmonary disease, unspecified: Secondary | ICD-10-CM

## 2021-03-24 DIAGNOSIS — J441 Chronic obstructive pulmonary disease with (acute) exacerbation: Secondary | ICD-10-CM

## 2021-03-25 ENCOUNTER — Telehealth: Payer: Self-pay

## 2021-03-25 NOTE — Telephone Encounter (Signed)
LEFT MESSAGE REQUESTING CALL BACK REGARDING PT ASSISTANCE APPLICATION  CALL BACK [phone]

## 2021-03-29 ENCOUNTER — Other Ambulatory Visit: Payer: Self-pay | Admitting: Family Medicine

## 2021-03-29 DIAGNOSIS — F332 Major depressive disorder, recurrent severe without psychotic features: Secondary | ICD-10-CM

## 2021-03-29 DIAGNOSIS — F419 Anxiety disorder, unspecified: Secondary | ICD-10-CM

## 2021-04-12 ENCOUNTER — Other Ambulatory Visit: Payer: Self-pay | Admitting: Student

## 2021-04-12 DIAGNOSIS — J449 Chronic obstructive pulmonary disease, unspecified: Secondary | ICD-10-CM

## 2021-04-12 DIAGNOSIS — I10 Essential (primary) hypertension: Secondary | ICD-10-CM

## 2021-04-19 ENCOUNTER — Ambulatory Visit (INDEPENDENT_AMBULATORY_CARE_PROVIDER_SITE_OTHER): Payer: Medicare HMO | Admitting: Family Medicine

## 2021-04-19 ENCOUNTER — Encounter: Payer: Self-pay | Admitting: Family Medicine

## 2021-04-19 VITALS — BP 147/92 | HR 78 | Temp 97.2°F | Ht 73.0 in | Wt 224.2 lb

## 2021-04-19 DIAGNOSIS — K2100 Gastro-esophageal reflux disease with esophagitis, without bleeding: Secondary | ICD-10-CM | POA: Diagnosis not present

## 2021-04-19 DIAGNOSIS — J449 Chronic obstructive pulmonary disease, unspecified: Secondary | ICD-10-CM | POA: Diagnosis not present

## 2021-04-19 DIAGNOSIS — F332 Major depressive disorder, recurrent severe without psychotic features: Secondary | ICD-10-CM

## 2021-04-19 DIAGNOSIS — I25709 Atherosclerosis of coronary artery bypass graft(s), unspecified, with unspecified angina pectoris: Secondary | ICD-10-CM | POA: Diagnosis not present

## 2021-04-19 DIAGNOSIS — I10 Essential (primary) hypertension: Secondary | ICD-10-CM

## 2021-04-19 DIAGNOSIS — I252 Old myocardial infarction: Secondary | ICD-10-CM | POA: Diagnosis not present

## 2021-04-19 DIAGNOSIS — E785 Hyperlipidemia, unspecified: Secondary | ICD-10-CM | POA: Diagnosis not present

## 2021-04-19 DIAGNOSIS — F419 Anxiety disorder, unspecified: Secondary | ICD-10-CM

## 2021-04-19 DIAGNOSIS — M25512 Pain in left shoulder: Secondary | ICD-10-CM

## 2021-04-19 DIAGNOSIS — Z951 Presence of aortocoronary bypass graft: Secondary | ICD-10-CM | POA: Diagnosis not present

## 2021-04-19 MED ORDER — METHYLPREDNISOLONE ACETATE 80 MG/ML IJ SUSP
80.0000 mg | Freq: Once | INTRAMUSCULAR | Status: AC
  Administered 2021-04-19: 80 mg via INTRAMUSCULAR

## 2021-04-19 MED ORDER — LOSARTAN POTASSIUM 100 MG PO TABS: 100.0000 mg | ORAL_TABLET | Freq: Every day | ORAL | 2 refills | Status: DC

## 2021-04-19 MED ORDER — TRAZODONE HCL 50 MG PO TABS
25.0000 mg | ORAL_TABLET | Freq: Every evening | ORAL | 3 refills | Status: DC | PRN
Start: 2021-04-19 — End: 2021-08-04

## 2021-04-19 MED ORDER — IPRATROPIUM-ALBUTEROL 0.5-2.5 (3) MG/3ML IN SOLN: 3.0000 mL | Freq: Three times a day (TID) | RESPIRATORY_TRACT | 0 refills | Status: DC | PRN

## 2021-04-19 MED ORDER — SPIRIVA RESPIMAT 2.5 MCG/ACT IN AERS: 2.0000 | INHALATION_SPRAY | Freq: Every day | RESPIRATORY_TRACT | 2 refills | Status: DC
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
## 2021-04-20 LAB — CMP14+EGFR
ALT: 26 IU/L (ref 0–44)
AST: 28 IU/L (ref 0–40)
Albumin/Globulin Ratio: 1.6 (ref 1.2–2.2)
Albumin: 4.4 g/dL (ref 3.8–4.8)
Alkaline Phosphatase: 107 IU/L (ref 44–121)
BUN/Creatinine Ratio: 13 (ref 10–24)
BUN: 18 mg/dL (ref 8–27)
Bilirubin Total: 0.5 mg/dL (ref 0.0–1.2)
CO2: 21 mmol/L (ref 20–29)
Calcium: 9.6 mg/dL (ref 8.6–10.2)
Chloride: 103 mmol/L (ref 96–106)
Creatinine, Ser: 1.39 mg/dL — ABNORMAL HIGH (ref 0.76–1.27)
Globulin, Total: 2.8 g/dL (ref 1.5–4.5)
Glucose: 97 mg/dL (ref 70–99)
Potassium: 4.9 mmol/L (ref 3.5–5.2)
Sodium: 141 mmol/L (ref 134–144)
Total Protein: 7.2 g/dL (ref 6.0–8.5)
eGFR: 57 mL/min/{1.73_m2} — ABNORMAL LOW (ref >59)

## 2021-04-20 LAB — LIPID PANEL
Chol/HDL Ratio: 3.1 ratio (ref 0.0–5.0)
Cholesterol, Total: 181 mg/dL (ref 100–199)
HDL: 59 mg/dL (ref >39)
LDL Chol Calc (NIH): 101 mg/dL — ABNORMAL HIGH (ref 0–99)
Triglycerides: 118 mg/dL (ref 0–149)
VLDL Cholesterol Cal: 21 mg/dL (ref 5–40)

## 2021-04-25 ENCOUNTER — Encounter: Payer: Self-pay | Admitting: Family Medicine

## 2021-04-27 ENCOUNTER — Telehealth: Payer: Medicare HMO

## 2021-04-27 NOTE — Telephone Encounter (Signed)
[employer] application was denied again--I know you reached out and patient didn't return call What info does he need to submit for an appeal? I can try to reach out too Thanks!

## 2021-04-28 ENCOUNTER — Other Ambulatory Visit: Payer: Self-pay | Admitting: Student

## 2021-05-01 ENCOUNTER — Other Ambulatory Visit: Payer: Self-pay | Admitting: Family Medicine

## 2021-05-01 DIAGNOSIS — F419 Anxiety disorder, unspecified: Secondary | ICD-10-CM

## 2021-05-01 DIAGNOSIS — F332 Major depressive disorder, recurrent severe without psychotic features: Secondary | ICD-10-CM

## 2021-05-03 IMAGING — CT CT ANGIO CHEST
2 of 6 series · 17 of 46 positions shown · IV contrast (Omnipaque or Isovue)
Comparison: Multiple prior most recent CT 12/07/2015

CLINICAL DATA: 64-year-old male with a history of thoracic aortic
aneurysm

EXAM:
CT ANGIOGRAPHY CHEST WITH CONTRAST
TECHNIQUE: Multidetector CT imaging of the chest was performed using the
standard protocol during bolus administration of intravenous
contrast. Multiplanar CT image reconstructions and MIPs were
obtained to evaluate the vascular anatomy.
CONTRAST:  100mL OMNIPAQUE IOHEXOL 350 MG/ML SOLN

[Series 7: lungs · axial · 0.77mm/px · z∈[+1204,+1496]mm · 14 of 166 slices shown]
[im 10/166  lung]
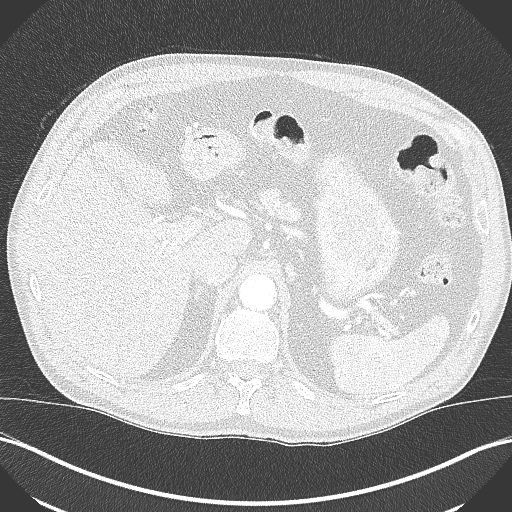
[im 19/166  soft-tissue]
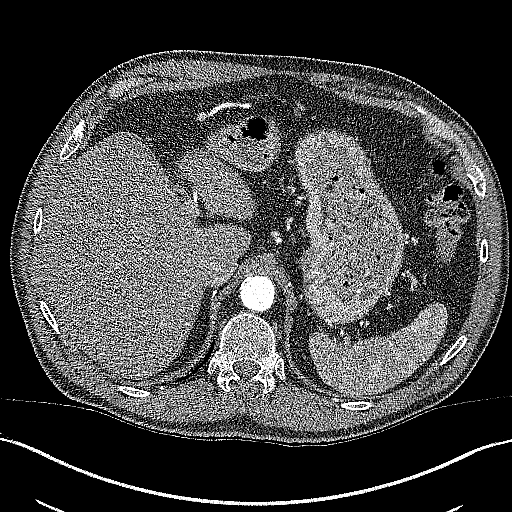
[im 37/166  lung]
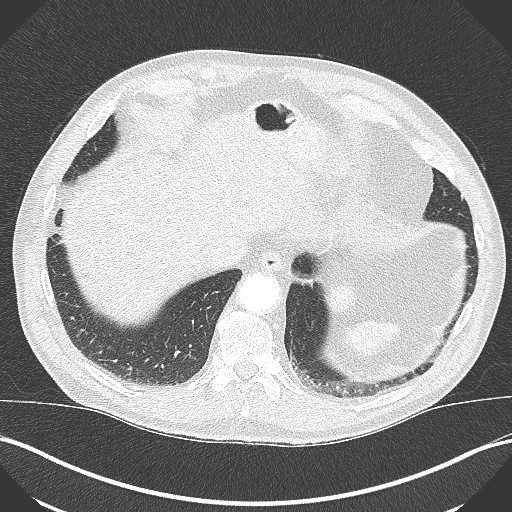
[im 46/166  soft-tissue]
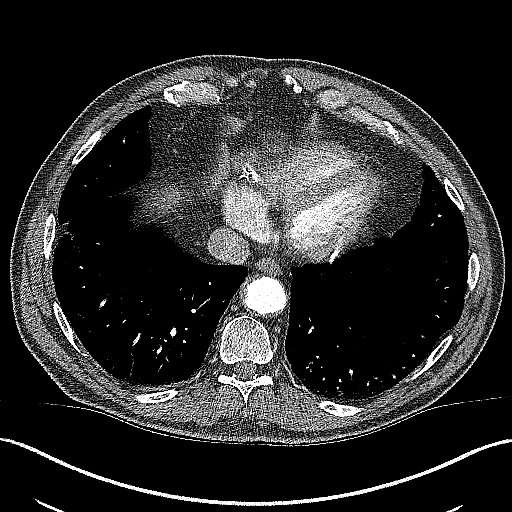
[im 56/166  lung]
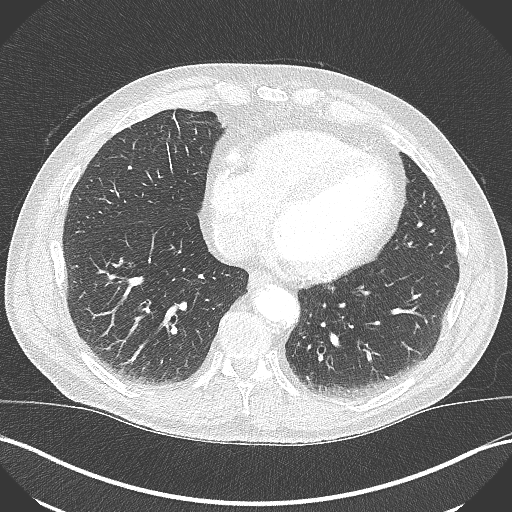
[im 65/166  soft-tissue]
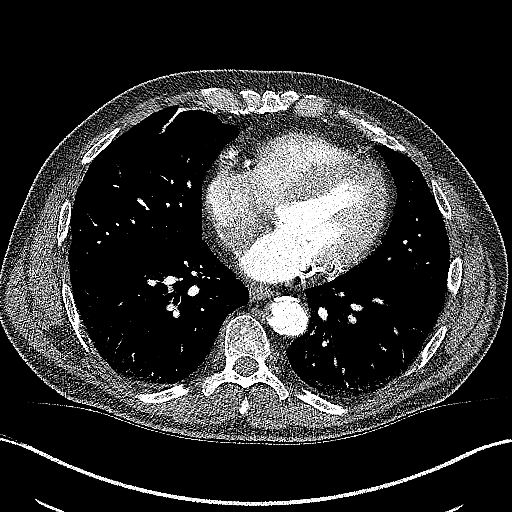
[im 74/166  lung]
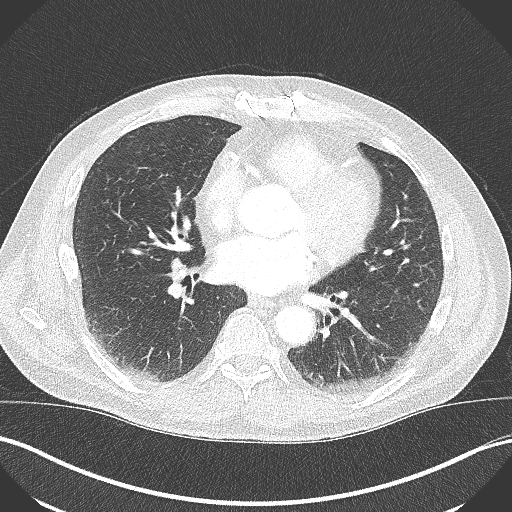
[im 92/166  soft-tissue]
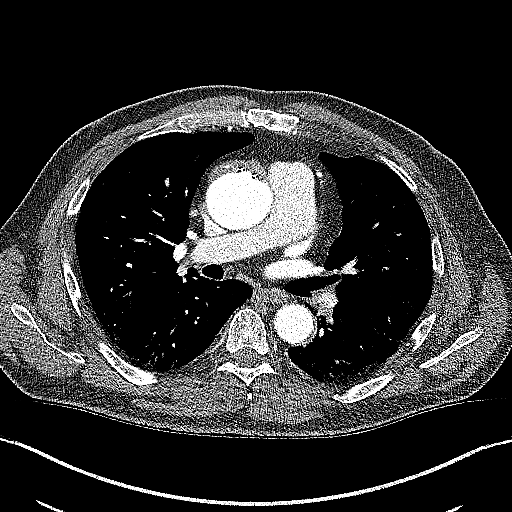
[im 101/166  lung]
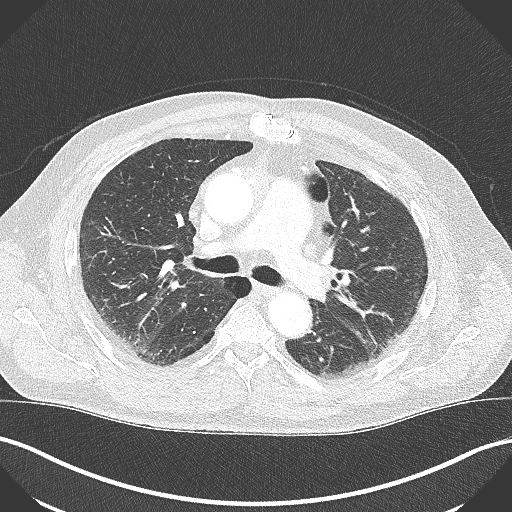
[im 111/166  soft-tissue]
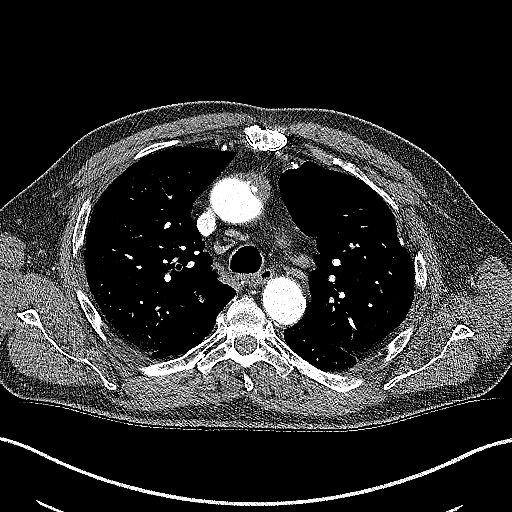
[im 120/166  lung]
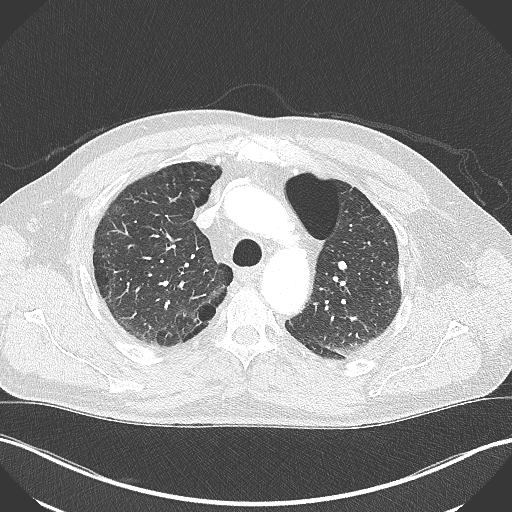
[im 129/166  soft-tissue]
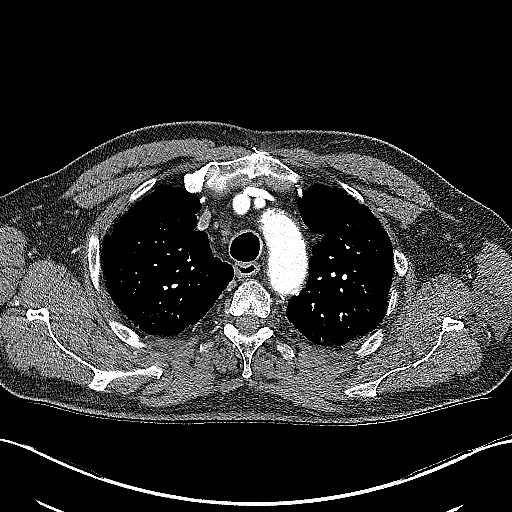
[im 147/166  lung]
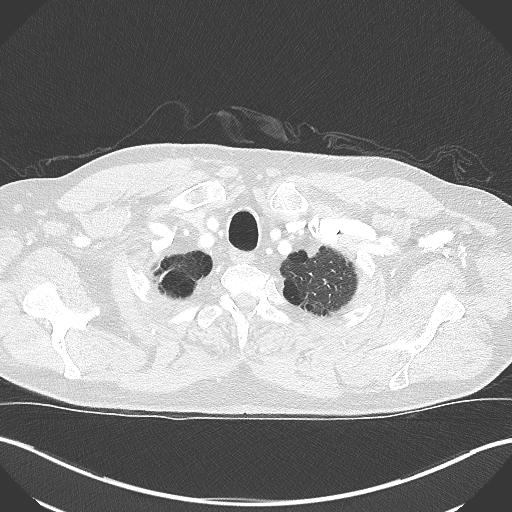
[im 156/166  soft-tissue]
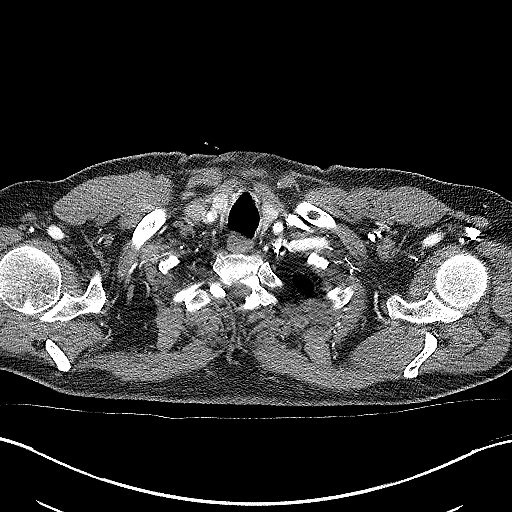

[Series 8: cor soft · coronal · 0.66mm/px · 3 of 147 slices shown]
[im 37/147  soft-tissue]
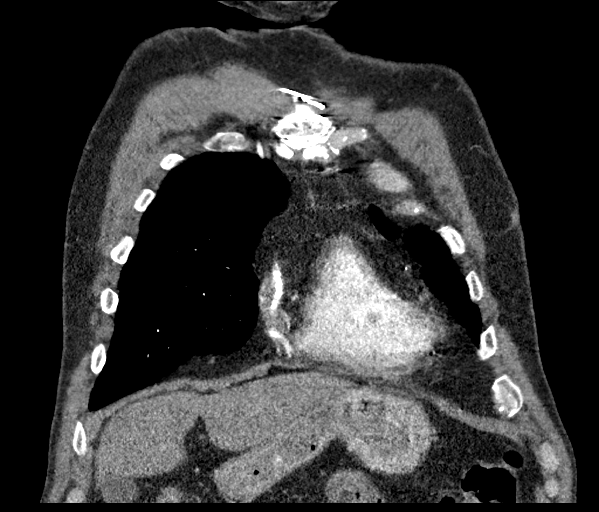
[im 74/147  soft-tissue]
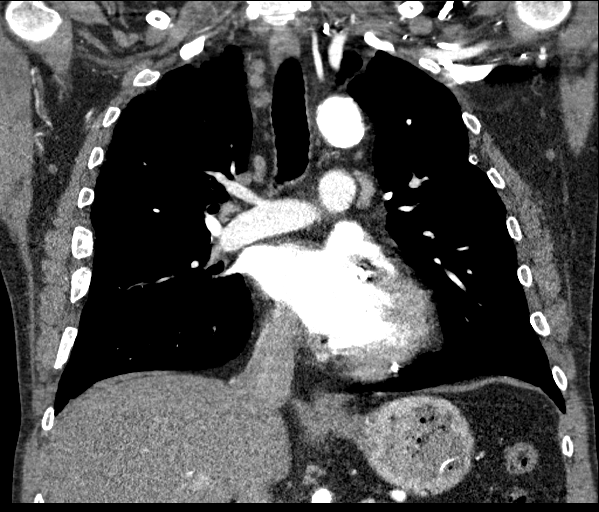
[im 110/147  soft-tissue]
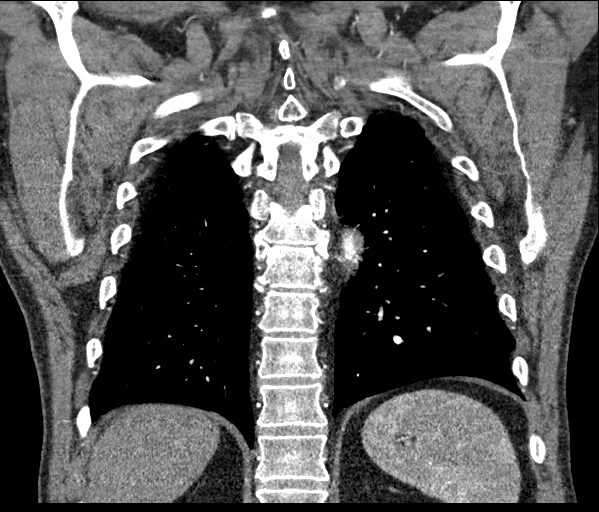

[17 of 46 positions shown; findings below may reference images not displayed]

FINDINGS: Cardiovascular:

Heart:

Heart size unchanged. Surgical changes of CABG. Native coronary
artery atherosclerosis.

Aorta:

No significant aortic valve calcifications. Surgical changes of
prior CABG.

Estimated diameter of the annulus 29 mm on the coronal images.

Estimated diameter of the Alekos Alex junction on the coronal
images, 39 mm

Greatest estimated diameter of the ascending aorta on the current
study 39 mm-01 mm on the coronal images. I do not see the location
of the prior measurement recorded of 4.7 cm, which is presumably a
typo.

No dissection. Atherosclerotic changes of the descending thoracic
aorta. No periaortic fluid.

Pulmonary arteries:

Greatest diameter of main pulmonary artery estimated 39 mm. No
significant enlargement of the pulmonary arteries in the periphery
of the lungs.

Mediastinum/Nodes: Small lymph nodes present within the mediastinum,
similar to the comparison. Unremarkable thoracic inlet. Unremarkable
appearance of the thoracic esophagus.

Lungs/Pleura: Paraseptal/centrilobular emphysema with bullous change
at the apices. New scarring/atelectasis at the right lung base at
the fissure. No pleural effusion. No confluent airspace disease.

No pneumothorax.

Upper Abdomen: No acute finding of the upper abdomen. Low-density
lesion in the dome of the liver, unchanged from the comparison and
most likely a benign biliary cyst.

Musculoskeletal: No acute displaced fracture. Degenerative changes
of the spine.

Review of the MIP images confirms the above findings.
IMPRESSION: The greatest diameter of the ascending aorta estimated on the
current CT is 39 mm-01 mm. I cannot recreate measurement on the
prior of 4.7 cm, which appears to be a typo. At the most, annual
imaging followup by CTA or MRA may be reasonable, for diameter of 4
cm. This recommendation follows 7141
ACCF/AHA/AATS/ACR/ASA/SCA/OLIMPIA/NOBLES/DAWER/JAN KURT Guidelines for the
Diagnosis and Management of Patients with Thoracic Aortic Disease.
Circulation. 7141; 121: E266-e369. Aortic aneurysm NOS (6DAFP-H99.1)

Changes of CABG with native coronary artery disease and associated
aortic atherosclerosis. Aortic Atherosclerosis (6DAFP-Z1M.M).

Emphysema (6DAFP-ZLF.Z).

## 2021-06-01 ENCOUNTER — Other Ambulatory Visit: Payer: Self-pay | Admitting: Student

## 2021-06-08 ENCOUNTER — Telehealth: Payer: Medicare HMO

## 2021-06-15 ENCOUNTER — Other Ambulatory Visit: Payer: Self-pay | Admitting: Nurse Practitioner

## 2021-06-15 DIAGNOSIS — J449 Chronic obstructive pulmonary disease, unspecified: Secondary | ICD-10-CM

## 2021-06-15 DIAGNOSIS — J441 Chronic obstructive pulmonary disease with (acute) exacerbation: Secondary | ICD-10-CM

## 2021-06-26 ENCOUNTER — Other Ambulatory Visit: Payer: Self-pay | Admitting: Nurse Practitioner

## 2021-06-26 ENCOUNTER — Other Ambulatory Visit: Payer: Self-pay | Admitting: Family Medicine

## 2021-06-26 DIAGNOSIS — I252 Old myocardial infarction: Secondary | ICD-10-CM

## 2021-06-26 DIAGNOSIS — Z951 Presence of aortocoronary bypass graft: Secondary | ICD-10-CM

## 2021-06-26 DIAGNOSIS — R059 Cough, unspecified: Secondary | ICD-10-CM

## 2021-06-26 DIAGNOSIS — I25709 Atherosclerosis of coronary artery bypass graft(s), unspecified, with unspecified angina pectoris: Secondary | ICD-10-CM

## 2021-06-26 DIAGNOSIS — E785 Hyperlipidemia, unspecified: Secondary | ICD-10-CM

## 2021-06-26 DIAGNOSIS — J449 Chronic obstructive pulmonary disease, unspecified: Secondary | ICD-10-CM
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
## 2021-06-30 ENCOUNTER — Ambulatory Visit: Payer: Medicare HMO | Admitting: Student

## 2021-07-01 ENCOUNTER — Telehealth: Payer: Medicare HMO

## 2021-07-01 ENCOUNTER — Telehealth: Payer: Self-pay | Admitting: Pharmacist

## 2021-07-01 NOTE — Telephone Encounter (Signed)
Left VM with patient re: inhaler therapy ?Unsure if spiriva is enough ?Doesn't qualify for az&me patient assistance (see Camille's note) ?Awaiting call back ?

## 2021-07-05 ENCOUNTER — Other Ambulatory Visit: Payer: Self-pay | Admitting: Family Medicine

## 2021-07-05 DIAGNOSIS — I5043 Acute on chronic combined systolic (congestive) and diastolic (congestive) heart failure: Secondary | ICD-10-CM

## 2021-07-07 ENCOUNTER — Encounter: Payer: Self-pay | Admitting: Family Medicine

## 2021-07-13 ENCOUNTER — Other Ambulatory Visit: Payer: Self-pay | Admitting: Family Medicine

## 2021-07-13 DIAGNOSIS — J441 Chronic obstructive pulmonary disease with (acute) exacerbation: Secondary | ICD-10-CM

## 2021-07-13 DIAGNOSIS — J449 Chronic obstructive pulmonary disease, unspecified: Secondary | ICD-10-CM

## 2021-07-13 DIAGNOSIS — J439 Emphysema, unspecified: Secondary | ICD-10-CM

## 2021-07-21 ENCOUNTER — Other Ambulatory Visit: Payer: Self-pay | Admitting: Student

## 2021-07-22 ENCOUNTER — Encounter: Payer: Medicare HMO | Admitting: Family Medicine

## 2021-07-27 ENCOUNTER — Other Ambulatory Visit: Payer: Self-pay | Admitting: Family Medicine

## 2021-07-27 DIAGNOSIS — I10 Essential (primary) hypertension: Secondary | ICD-10-CM

## 2021-07-27 DIAGNOSIS — I25709 Atherosclerosis of coronary artery bypass graft(s), unspecified, with unspecified angina pectoris: Secondary | ICD-10-CM

## 2021-07-27 DIAGNOSIS — E785 Hyperlipidemia, unspecified: Secondary | ICD-10-CM

## 2021-07-27 DIAGNOSIS — I252 Old myocardial infarction: Secondary | ICD-10-CM

## 2021-07-27 DIAGNOSIS — Z951 Presence of aortocoronary bypass graft: Secondary | ICD-10-CM

## 2021-08-01 ENCOUNTER — Other Ambulatory Visit: Payer: Self-pay | Admitting: Family Medicine

## 2021-08-01 DIAGNOSIS — F332 Major depressive disorder, recurrent severe without psychotic features: Secondary | ICD-10-CM

## 2021-08-02 ENCOUNTER — Telehealth: Payer: Self-pay | Admitting: Family Medicine

## 2021-08-02 NOTE — Telephone Encounter (Signed)
Pt states he has a follow up scheduled with you 08/17/21 because he had to reschedule from 5/12 when you were out. He says he can't sleep and he doesn't know if he will make it till 08/17/21. Can you send something in before the appt or do you want to try and do a televisit for this or put him in acute slot?

## 2021-08-02 NOTE — Telephone Encounter (Signed)
For depression there is no proven benefit to increase the dose over 50 mg. If he is still uncontrolled we need to add something else.

## 2021-08-03 ENCOUNTER — Other Ambulatory Visit: Payer: Self-pay | Admitting: Family Medicine

## 2021-08-03 DIAGNOSIS — J449 Chronic obstructive pulmonary disease, unspecified: Secondary | ICD-10-CM

## 2021-08-03 DIAGNOSIS — J441 Chronic obstructive pulmonary disease with (acute) exacerbation: Secondary | ICD-10-CM

## 2021-08-03 DIAGNOSIS — J4489 Other specified chronic obstructive pulmonary disease: Secondary | ICD-10-CM

## 2021-08-03 NOTE — Telephone Encounter (Signed)
lmtcb

## 2021-08-03 NOTE — Telephone Encounter (Signed)
Please schedule for televisit or put in an acute spot with me.

## 2021-08-04 ENCOUNTER — Ambulatory Visit (INDEPENDENT_AMBULATORY_CARE_PROVIDER_SITE_OTHER): Payer: Medicare HMO | Admitting: Family Medicine

## 2021-08-04 ENCOUNTER — Encounter: Payer: Self-pay | Admitting: Family Medicine

## 2021-08-04 DIAGNOSIS — Z5982 Transportation insecurity: Secondary | ICD-10-CM

## 2021-08-04 DIAGNOSIS — G2581 Restless legs syndrome: Secondary | ICD-10-CM | POA: Diagnosis not present

## 2021-08-04 DIAGNOSIS — F5101 Primary insomnia: Secondary | ICD-10-CM | POA: Diagnosis not present

## 2021-08-04 DIAGNOSIS — J449 Chronic obstructive pulmonary disease, unspecified: Secondary | ICD-10-CM

## 2021-08-04 MED ORDER — BELSOMRA 10 MG PO TABS: 10.0000 mg | ORAL_TABLET | Freq: Every evening | ORAL | 0 refills | Status: DC | PRN
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
## 2021-08-09 ENCOUNTER — Other Ambulatory Visit: Payer: Self-pay | Admitting: Family Medicine

## 2021-08-09 ENCOUNTER — Telehealth: Payer: Self-pay | Admitting: Student

## 2021-08-09 NOTE — Telephone Encounter (Signed)
Pt would like a callback regarding advice that PCP has given him about seeing a Lung Specialist. Please advise

## 2021-08-09 NOTE — Telephone Encounter (Signed)
Pt called to say that he has been referred to a lung doctor.

## 2021-08-10 ENCOUNTER — Telehealth: Payer: Self-pay

## 2021-08-10 NOTE — Telephone Encounter (Signed)
   Telephone encounter was:  Successful.  08/10/2021 Name: Tamaj Jurgens MRN: [id number] DOB: Oct 12, 1955  Kenson Groh is a 66 y.o. year old male who is a primary care patient of [name], FNP . The community resource team was consulted for assistance with Transportation Needs   Care guide performed the following interventions: Spoke with patient aout [facility] for in county transportation.  He already has the number to call, placed at referral to [facility] through [facility]. Patient does not have an insurance transportation benefit. Sending a request to [facility] for 08/18/21 appointment at [facility] at 2:30 pm.  Follow Up Plan:  Care guide will follow up with patient by phone over the next 7 days.  Gagan Dillion, AAS Paralegal, [facility] Care Guide  Embedded Care Coordination [place]  Care Management  [address] [place], [place] [address] ??[email or web address]  ?? [phone]   [email or web address]

## 2021-08-15 NOTE — Telephone Encounter (Signed)
Patient has appointment scheduled on 6/6 with PCP

## 2021-08-16 ENCOUNTER — Ambulatory Visit (INDEPENDENT_AMBULATORY_CARE_PROVIDER_SITE_OTHER): Payer: Medicare HMO | Admitting: Family Medicine

## 2021-08-16 ENCOUNTER — Encounter: Payer: Self-pay | Admitting: Family Medicine

## 2021-08-16 ENCOUNTER — Telehealth: Payer: Self-pay

## 2021-08-16 VITALS — BP 129/87 | HR 79 | Temp 97.0°F | Ht 73.0 in | Wt 224.0 lb

## 2021-08-16 DIAGNOSIS — Z720 Tobacco use: Secondary | ICD-10-CM

## 2021-08-16 DIAGNOSIS — I7121 Aneurysm of the ascending aorta, without rupture: Secondary | ICD-10-CM | POA: Diagnosis not present

## 2021-08-16 DIAGNOSIS — E785 Hyperlipidemia, unspecified: Secondary | ICD-10-CM

## 2021-08-16 DIAGNOSIS — G2581 Restless legs syndrome: Secondary | ICD-10-CM | POA: Diagnosis not present

## 2021-08-16 DIAGNOSIS — Z0000 Encounter for general adult medical examination without abnormal findings: Secondary | ICD-10-CM | POA: Diagnosis not present

## 2021-08-16 DIAGNOSIS — F5101 Primary insomnia: Secondary | ICD-10-CM

## 2021-08-16 DIAGNOSIS — F332 Major depressive disorder, recurrent severe without psychotic features: Secondary | ICD-10-CM

## 2021-08-16 DIAGNOSIS — I25709 Atherosclerosis of coronary artery bypass graft(s), unspecified, with unspecified angina pectoris: Secondary | ICD-10-CM

## 2021-08-16 DIAGNOSIS — H9191 Unspecified hearing loss, right ear: Secondary | ICD-10-CM

## 2021-08-16 DIAGNOSIS — I10 Essential (primary) hypertension: Secondary | ICD-10-CM

## 2021-08-16 DIAGNOSIS — F419 Anxiety disorder, unspecified: Secondary | ICD-10-CM

## 2021-08-16 DIAGNOSIS — I252 Old myocardial infarction: Secondary | ICD-10-CM

## 2021-08-16 DIAGNOSIS — Z0001 Encounter for general adult medical examination with abnormal findings: Secondary | ICD-10-CM

## 2021-08-16 DIAGNOSIS — J449 Chronic obstructive pulmonary disease, unspecified: Secondary | ICD-10-CM | POA: Diagnosis not present

## 2021-08-16 DIAGNOSIS — Z951 Presence of aortocoronary bypass graft: Secondary | ICD-10-CM

## 2021-08-16 MED ORDER — CARIPRAZINE HCL 1.5 MG PO CAPS: 1.5000 mg | ORAL_CAPSULE | Freq: Every day | ORAL | 2 refills | Status: DC

## 2021-08-16 MED ORDER — EZETIMIBE 10 MG PO TABS: 10.0000 mg | ORAL_TABLET | Freq: Every day | ORAL | 1 refills | Status: DC

## 2021-08-16 MED ORDER — DESVENLAFAXINE SUCCINATE ER 50 MG PO TB24: 50.0000 mg | ORAL_TABLET | Freq: Every day | ORAL | 1 refills | Status: DC

## 2021-08-16 MED ORDER — LOSARTAN POTASSIUM 100 MG PO TABS: 100.0000 mg | ORAL_TABLET | Freq: Every day | ORAL | 1 refills | Status: DC

## 2021-08-16 MED ORDER — NICOTINE 21 MG/24HR TD PT24: 21.0000 mg | MEDICATED_PATCH | Freq: Every day | TRANSDERMAL | 0 refills | Status: DC

## 2021-08-16 MED ORDER — TEMAZEPAM 15 MG PO CAPS: 15.0000 mg | ORAL_CAPSULE | Freq: Every evening | ORAL | 0 refills | Status: DC | PRN

## 2021-08-16 NOTE — Telephone Encounter (Signed)
   Telephone encounter was:  Successful.  08/16/2021 Name: [name] MRN: [id number] DOB: [birth year]  Kevin Smith is a 66 y.o. year old male who is a primary care patient of [name], FNP . The community resource team was consulted for assistance with Transportation Needs   Care guide performed the following interventions: Spoke with patient to ensure he knew to expect a call from [facility] and he is on their schedule for Thursday.   Follow Up Plan:  No further follow up planned at this time. The patient has been provided with needed resources.  Lyan Holck, AAS Paralegal, [facility] Care Guide  Embedded Care Coordination [place]  Care Management  [address] [place], [place] [address] ??[email or web address]  ?? [phone]   [email or web address]
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
## 2021-08-16 NOTE — Telephone Encounter (Signed)
   Telephone encounter was:  Successful.  08/16/2021 Name: Kevin Smith MRN: [id number] DOB: [birth year]  Kevin Smith is a 66 y.o. year old male who is a primary care patient of [name], FNP . The community resource team was consulted for assistance with Transportation Needs   Care guide performed the following interventions: Spoke with [place] at [employer] to confirm ride for 08/18/21 2:15pm [facility].  Patient will be called tomorrow to inform him of pick-up time.    Follow Up Plan:   I will call the patient to inform him transportation has been scheduled for his 08/18/21  appointment.  Tameia Rafferty, AAS Paralegal, [facility] Care Guide  Embedded Care Coordination [place]  Care Management  [address] [place], [place] [address] ??[email or web address]  ?? [phone]   [email or web address]

## 2021-08-16 NOTE — Telephone Encounter (Signed)
   Telephone encounter was:  Unsuccessful.  08/16/2021 Name: Kevin Smith MRN: [id number] DOB: [birth year]  Unsuccessful outbound call made today to assist with:  Transportation Needs   Outreach Attempt:  2nd Attempt  A HIPAA compliant voice message was left requesting a return call.  Instructed patient to call back at [phone]. Left message on voicemail to inform patient his transportation has been scheduled for 08/18/21 and to expect a call from [employer].  Also left my name and number.   Adyn Serna, AAS Paralegal, [facility] Care Guide  Embedded Care Coordination [place]  Care Management  [address] [place], [place] [address] ??[email or web address]  ?? [phone]   [email or web address]

## 2021-08-17 ENCOUNTER — Telehealth: Payer: Self-pay | Admitting: *Deleted

## 2021-08-17 ENCOUNTER — Other Ambulatory Visit: Payer: Self-pay | Admitting: Family Medicine

## 2021-08-17 DIAGNOSIS — G2581 Restless legs syndrome: Secondary | ICD-10-CM

## 2021-08-17 LAB — ANEMIA PROFILE B
Basophils Absolute: 0.1 10*3/uL (ref 0.0–0.2)
Basos: 1 %
EOS (ABSOLUTE): 0.4 10*3/uL (ref 0.0–0.4)
Eos: 4 %
Ferritin: 323 ng/mL (ref 30–400)
Folate: 13.7 ng/mL (ref >3.0)
Hematocrit: 50.9 % (ref 37.5–51.0)
Hemoglobin: 17.8 g/dL — ABNORMAL HIGH (ref 13.0–17.7)
Immature Grans (Abs): 0 10*3/uL (ref 0.0–0.1)
Immature Granulocytes: 0 %
Iron Saturation: 42 % (ref 15–55)
Iron: 134 ug/dL (ref 38–169)
Lymphocytes Absolute: 2.7 10*3/uL (ref 0.7–3.1)
Lymphs: 28 %
MCH: 33.6 pg — ABNORMAL HIGH (ref 26.6–33.0)
MCHC: 35 g/dL (ref 31.5–35.7)
MCV: 96 fL (ref 79–97)
Monocytes Absolute: 0.6 10*3/uL (ref 0.1–0.9)
Monocytes: 7 %
Neutrophils Absolute: 5.7 10*3/uL (ref 1.4–7.0)
Neutrophils: 60 %
Platelets: 228 10*3/uL (ref 150–450)
RBC: 5.3 x10E6/uL (ref 4.14–5.80)
RDW: 12.1 % (ref 11.6–15.4)
Retic Ct Pct: 1.1 % (ref 0.6–2.6)
Total Iron Binding Capacity: 321 ug/dL (ref 250–450)
UIBC: 187 ug/dL (ref 111–343)
Vitamin B-12: 604 pg/mL (ref 232–1245)
WBC: 9.4 10*3/uL (ref 3.4–10.8)

## 2021-08-17 LAB — CMP14+EGFR
ALT: 39 IU/L (ref 0–44)
AST: 29 IU/L (ref 0–40)
Albumin/Globulin Ratio: 1.4 (ref 1.2–2.2)
Albumin: 4.7 g/dL (ref 3.8–4.8)
Alkaline Phosphatase: 98 IU/L (ref 44–121)
BUN/Creatinine Ratio: 15 (ref 10–24)
BUN: 22 mg/dL (ref 8–27)
Bilirubin Total: 0.7 mg/dL (ref 0.0–1.2)
CO2: 24 mmol/L (ref 20–29)
Calcium: 10.1 mg/dL (ref 8.6–10.2)
Chloride: 101 mmol/L (ref 96–106)
Creatinine, Ser: 1.49 mg/dL — ABNORMAL HIGH (ref 0.76–1.27)
Globulin, Total: 3.3 g/dL (ref 1.5–4.5)
Glucose: 91 mg/dL (ref 70–99)
Potassium: 4.5 mmol/L (ref 3.5–5.2)
Sodium: 143 mmol/L (ref 134–144)
Total Protein: 8 g/dL (ref 6.0–8.5)
eGFR: 52 mL/min/{1.73_m2} — ABNORMAL LOW (ref >59)

## 2021-08-17 LAB — LIPID PANEL
Chol/HDL Ratio: 3 ratio (ref 0.0–5.0)
Cholesterol, Total: 166 mg/dL (ref 100–199)
HDL: 56 mg/dL (ref >39)
LDL Chol Calc (NIH): 88 mg/dL (ref 0–99)
Triglycerides: 127 mg/dL (ref 0–149)
VLDL Cholesterol Cal: 22 mg/dL (ref 5–40)

## 2021-08-17 LAB — MAGNESIUM: Magnesium: 1.8 mg/dL (ref 1.6–2.3)

## 2021-08-17 LAB — PSA, TOTAL AND FREE
PSA, Free Pct: 24.1 %
PSA, Free: 0.65 ng/mL
Prostate Specific Ag, Serum: 2.7 ng/mL (ref 0.0–4.0)

## 2021-08-17 LAB — TSH: TSH: 2.34 u[IU]/mL (ref 0.450–4.500)

## 2021-08-17 MED ORDER — ROPINIROLE HCL 0.5 MG PO TABS: ORAL_TABLET | ORAL | 0 refills | Status: DC

## 2021-08-17 NOTE — Telephone Encounter (Signed)
Approved.  
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
## 2021-08-17 NOTE — Telephone Encounter (Signed)
Beckham Dasilva Key: [id number]- [id number] Vraylar 1.5MG  capsules Status: Sent to plan June 7th, 2023  Lottie Dawson, clinical pharmacist, assisted in submitting PA

## 2021-08-18 ENCOUNTER — Encounter: Payer: Self-pay | Admitting: Internal Medicine

## 2021-08-18 ENCOUNTER — Ambulatory Visit: Payer: Medicare HMO | Admitting: Internal Medicine

## 2021-08-18 VITALS — BP 102/60 | HR 90 | Ht 73.0 in | Wt 233.0 lb

## 2021-08-18 DIAGNOSIS — J449 Chronic obstructive pulmonary disease, unspecified: Secondary | ICD-10-CM | POA: Diagnosis not present

## 2021-08-18 DIAGNOSIS — R0609 Other forms of dyspnea: Secondary | ICD-10-CM | POA: Diagnosis not present

## 2021-08-18 DIAGNOSIS — F17210 Nicotine dependence, cigarettes, uncomplicated: Secondary | ICD-10-CM | POA: Diagnosis not present

## 2021-08-18 DIAGNOSIS — R058 Other specified cough: Secondary | ICD-10-CM | POA: Insufficient documentation

## 2021-08-18 MED ORDER — SPIRIVA RESPIMAT 2.5 MCG/ACT IN AERS: 2.0000 | INHALATION_SPRAY | Freq: Every day | RESPIRATORY_TRACT | 0 refills | Status: DC

## 2021-08-18 NOTE — Assessment & Plan Note (Addendum)
Onset ? 2006 with dx copd /gerd   Cough is daytime harsh and hacking assoc with sense of pnds but neg exam typical of Upper airway cough syndrome (previously labeled PNDS),  is so named because it's frequently impossible to sort out how much is  CR/sinusitis with freq throat clearing (which can be related to primary GERD)   vs  causing  secondary (" extra esophageal")  GERD from wide swings in gastric pressure that occur with throat clearing, often  promoting self use of mint and menthol lozenges that reduce the lower esophageal sphincter tone and exacerbate the problem further in a cyclical fashion.   These are the same pts (now being labeled as having "irritable larynx syndrome" by some cough centers) who not infrequently have a history of having failed to tolerate ace inhibitors,  dry powder inhalers or biphosphonates or report having atypical/extraesophageal reflux symptoms that don't respond to standard doses of PPI  and are easily confused as having aecopd or asthma flares by even experienced allergists/ pulmonologists (myself included).   rec  Max gerd rx  otc cough suppression/antihistamines/ hard rock candy  F/u ent if not responding to conservative rx   Each maintenance medication was reviewed in detail including emphasizing most importantly the difference between maintenance and prns and under what circumstances the prns are to be triggered using an action plan format where appropriate.  Total time for H and P, chart review, counseling, reviewing hfa device(s) , directly observing portions of ambulatory 02 saturation study/ and generating customized AVS unique to this new pt office visit / same day charting > 45 min for multiple  refractory respiratory  symptoms of uncertain etiology

## 2021-08-18 NOTE — Assessment & Plan Note (Signed)
Active smoker / onset of symptoms around 2006 - Spirometry 08/18/2021  FEV1 2.4 (63%)  Ratio 0.68 p am symb/spiriva though technique poor - Allergy screen 08/18/2021 >  Eos 0. /  IgE   - Labs ordered 08/18/2021  :     alpha one AT phenotype   - 08/18/2021   Walked on RA  x  2  lap(s) =  approx 500  ft  @ slow due to hip pace, stopped due to hip pain s sob with lowest 02 sats 97%    - 08/18/2021  After extensive coaching inhaler device,  effectiveness =    75% symb/spriva vs breztri   Group D (now reclassified as E) in terms of symptom/risk and laba/lama/ICS  therefore appropriate rx at this point >>>  Symb/spiriva vs breztri and more approp saba  Re SABA :  I spent extra time with pt today reviewing appropriate use of albuterol for prn use on exertion with the following points: 1) saba is for relief of sob that does not improve by walking a slower pace or resting but rather if the pt does not improve after trying this first. 2) If the pt is convinced, as many are, that saba helps recover from activity faster then it's easy to tell if this is the case by re-challenging : ie stop, take the inhaler, then p 5 minutes try the exact same activity (intensity of workload) that just caused the symptoms and see if they are substantially diminished or not after saba 3) if there is an activity that reproducibly causes the symptoms, try the saba 15 min before the activity on alternate days   If in fact the saba really does help, then fine to continue to use it prn but advised may need to look closer at the maintenance regimen being used to achieve better control of airways disease with exertion.

## 2021-08-18 NOTE — Patient Instructions (Addendum)
Plan A = Automatic = Always=    Symbicort / spiriva (or Breztri )  2 puffs of each first thing in am  and the  3rd and 4th puff symbicort or breztri is 12 hours  Work on inhaler technique:  relax and gently blow all the way out then take a nice smooth full deep breath back in, triggering the inhaler at same time you start breathing in.  Hold for up to 5 seconds if you can. Blow out thru nose. Rinse and gargle with water when done.  If mouth or throat bother you at all,  try brushing teeth/gums/tongue with arm and hammer toothpaste/ make a slurry and gargle and spit out.      Plan B = Backup (to supplement plan A, not to replace it) Only use your albuterol inhaler as a rescue medication to be used if you can't catch your breath by resting or doing a relaxed purse lip breathing pattern.  - The less you use it, the better it will work when you need it. - Ok to use the inhaler up to 2 puffs  every 4 hours if you must but call for appointment if use goes up over your usual need - Don't leave home without it !!  (think of it like the starter fluid  for your car)   Plan C = Crisis (instead of Plan B but only if Plan B stops working) - only use your albuterol nebulizer if you first try Plan B and it fails to help > ok to use the nebulizer up to every 4 hours but if start needing it regularly call for immediate appointment   Pantoprazole 40 mg should be Take 30-60 min before first meal of the day   GERD (REFLUX)  is an extremely common cause of respiratory symptoms just like yours , many times with no obvious heartburn at all.    It can be treated with medication, but also with lifestyle changes including elevation of the head of your bed (ideally with 6 -8inch blocks under the headboard of your bed),  Smoking cessation, avoidance of late meals, excessive alcohol, and avoid fatty foods, chocolate, peppermint, colas, red wine, and acidic juices such as orange juice.  NO MINT OR MENTHOL PRODUCTS SO NO COUGH  DROPS  USE SUGARLESS CANDY INSTEAD (Jolley ranchers or Stover's or Life Savers) or even ice chips will also do - the key is to swallow to prevent all throat clearing. NO OIL BASED VITAMINS - use powdered substitutes.  Avoid fish oil when coughing.          Please remember to go to the lab department   for your tests - we will call you with the results when they are available.      Please schedule a follow up office visit in 6 weeks, call sooner if needed with inhalers

## 2021-08-18 NOTE — Assessment & Plan Note (Signed)
4-5 min discussion re active cigarette smoking in addition to office E&M  Ask about tobacco use:   Ongoing  Advise quitting   > 3 min discussion I reviewed the Fletcher curve with the patient that basically indicates  if you quit smoking when your best day FEV1 is still well preserved (as is still the case here)  it is highly unlikely you will progress to severe disease and informed the patient there was  no medication on the market that has proven to alter the curve/ its downward trajectory  or the likelihood of progression of their disease(unlike other chronic medical conditions such as atheroclerosis where we do think we can change the natural hx with risk reducing meds)    Therefore stopping smoking and maintaining abstinence are  the most important aspects of care, not choice of inhalers or for that matter, pulmonary doctors.  Assess willingness:  Not completely  committed at this point Assist in quit attempt:  Per PCP when ready Arrange follow up:   Follow up per Primary Care planned    Also: Low-dose CT lung cancer screening is recommended for patients who are 35-66 years of age with a 20+ pack-year history of smoking and who are currently smoking or quit <=15 years ago. No coughing up blood  No unintentional weight loss of > 15 pounds in the last 6 months - pt is eligible for scanning yearly until age 74 > I see he has CT already set up by PCP, suggested he keep appt but consider next year entering the yearly LDSCT program if not already being done locally by PCP (either way is fine and long as the scan is requested yearly)

## 2021-08-19 ENCOUNTER — Other Ambulatory Visit: Payer: Self-pay | Admitting: Student

## 2021-08-19 LAB — CBC WITH DIFFERENTIAL/PLATELET
Basophils Absolute: 0.1 10*3/uL (ref 0.0–0.1)
Basophils Relative: 0.6 % (ref 0.0–3.0)
Eosinophils Absolute: 0.3 10*3/uL (ref 0.0–0.7)
Eosinophils Relative: 3.8 % (ref 0.0–5.0)
HCT: 51.2 % (ref 39.0–52.0)
Hemoglobin: 17.3 g/dL — ABNORMAL HIGH (ref 13.0–17.0)
Lymphocytes Relative: 27.7 % (ref 12.0–46.0)
Lymphs Abs: 2.6 10*3/uL (ref 0.7–4.0)
MCHC: 33.7 g/dL (ref 30.0–36.0)
MCV: 98.1 fl (ref 78.0–100.0)
Monocytes Absolute: 0.8 10*3/uL (ref 0.1–1.0)
Monocytes Relative: 8.7 % (ref 3.0–12.0)
Neutro Abs: 5.5 10*3/uL (ref 1.4–7.7)
Neutrophils Relative %: 59.2 % (ref 43.0–77.0)
Platelets: 233 10*3/uL (ref 150.0–400.0)
RBC: 5.22 Mil/uL (ref 4.22–5.81)
RDW: 13.2 % (ref 11.5–15.5)
WBC: 9.2 10*3/uL (ref 4.0–10.5)

## 2021-08-19 LAB — IGE: IgE (Immunoglobulin E), Serum: 382 kU/L — ABNORMAL HIGH (ref <=114)

## 2021-08-22 ENCOUNTER — Telehealth: Payer: Self-pay | Admitting: Internal Medicine

## 2021-08-22 NOTE — Progress Notes (Signed)
Called pt and there was no answer-LMTCB °

## 2021-08-23 ENCOUNTER — Ambulatory Visit (HOSPITAL_BASED_OUTPATIENT_CLINIC_OR_DEPARTMENT_OTHER): Admission: RE | Admit: 2021-08-23 | Payer: Medicare HMO | Source: Ambulatory Visit

## 2021-08-23 ENCOUNTER — Encounter (HOSPITAL_BASED_OUTPATIENT_CLINIC_OR_DEPARTMENT_OTHER): Payer: Self-pay

## 2021-08-23 ENCOUNTER — Ambulatory Visit (HOSPITAL_BASED_OUTPATIENT_CLINIC_OR_DEPARTMENT_OTHER)
Admission: RE | Admit: 2021-08-23 | Discharge: 2021-08-23 | Disposition: A | Discharge status: Home / Self Care | Payer: Medicare HMO | Source: Ambulatory Visit | Attending: Family Medicine | Admitting: Family Medicine

## 2021-08-23 DIAGNOSIS — I7121 Aneurysm of the ascending aorta, without rupture: Secondary | ICD-10-CM | POA: Diagnosis not present

## 2021-08-23 DIAGNOSIS — J9811 Atelectasis: Secondary | ICD-10-CM | POA: Diagnosis not present

## 2021-08-23 DIAGNOSIS — J439 Emphysema, unspecified: Secondary | ICD-10-CM | POA: Diagnosis not present

## 2021-08-23 MED ORDER — IOHEXOL 350 MG/ML SOLN
100.0000 mL | Freq: Once | INTRAVENOUS | Status: AC | PRN
Start: 2021-08-23 — End: 2021-08-23
  Administered 2021-08-23: 75 mL via INTRAVENOUS

## 2021-08-23 NOTE — Progress Notes (Signed)
Spoke with pt and voices understanding. Pt did not have any further questions.

## 2021-08-23 NOTE — Telephone Encounter (Signed)
See results note. Closing encounter.

## 2021-08-24 ENCOUNTER — Ambulatory Visit: Payer: Medicare HMO | Admitting: Audiologist

## 2021-08-24 ENCOUNTER — Ambulatory Visit: Payer: Medicare HMO | Attending: Audiologist | Admitting: Audiologist

## 2021-08-24 DIAGNOSIS — H90A22 Sensorineural hearing loss, unilateral, left ear, with restricted hearing on the contralateral side: Secondary | ICD-10-CM | POA: Insufficient documentation

## 2021-08-24 DIAGNOSIS — H9191 Unspecified hearing loss, right ear: Secondary | ICD-10-CM | POA: Insufficient documentation
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
## 2021-08-25 ENCOUNTER — Other Ambulatory Visit: Payer: Self-pay | Admitting: Family Medicine

## 2021-08-25 DIAGNOSIS — J449 Chronic obstructive pulmonary disease, unspecified: Secondary | ICD-10-CM

## 2021-08-25 DIAGNOSIS — J441 Chronic obstructive pulmonary disease with (acute) exacerbation: Secondary | ICD-10-CM

## 2021-08-28 ENCOUNTER — Encounter: Payer: Self-pay | Admitting: Family Medicine

## 2021-08-30 ENCOUNTER — Other Ambulatory Visit: Payer: Self-pay | Admitting: Nurse Practitioner

## 2021-08-30 DIAGNOSIS — M5412 Radiculopathy, cervical region: Secondary | ICD-10-CM

## 2021-08-30 LAB — ALPHA-1-ANTITRYPSIN PHENOTYP: A-1 Antitrypsin: 194 mg/dL — ABNORMAL HIGH (ref 101–187)

## 2021-08-31 ENCOUNTER — Ambulatory Visit (INDEPENDENT_AMBULATORY_CARE_PROVIDER_SITE_OTHER): Payer: Medicare HMO | Admitting: Family Medicine

## 2021-08-31 ENCOUNTER — Encounter: Payer: Self-pay | Admitting: Family Medicine

## 2021-08-31 VITALS — BP 134/94 | HR 62 | Temp 97.6°F | Ht 73.0 in | Wt 233.4 lb

## 2021-08-31 DIAGNOSIS — J440 Chronic obstructive pulmonary disease with acute lower respiratory infection: Secondary | ICD-10-CM | POA: Diagnosis not present

## 2021-08-31 DIAGNOSIS — J209 Acute bronchitis, unspecified: Secondary | ICD-10-CM | POA: Diagnosis not present

## 2021-08-31 DIAGNOSIS — J449 Chronic obstructive pulmonary disease, unspecified: Secondary | ICD-10-CM

## 2021-08-31 DIAGNOSIS — Z596 Low income: Secondary | ICD-10-CM

## 2021-08-31 MED ORDER — HYDROCODONE BIT-HOMATROP MBR 5-1.5 MG/5ML PO SOLN: 5.0000 mL | Freq: Four times a day (QID) | ORAL | 0 refills | Status: DC | PRN

## 2021-08-31 MED ORDER — PREDNISONE 20 MG PO TABS: ORAL_TABLET | ORAL | 0 refills | Status: DC

## 2021-08-31 MED ORDER — BREZTRI AEROSPHERE 160-9-4.8 MCG/ACT IN AERO: 2.0000 | INHALATION_SPRAY | Freq: Two times a day (BID) | RESPIRATORY_TRACT | 1 refills | Status: DC

## 2021-08-31 NOTE — Progress Notes (Unsigned)
Assessment & Plan:  ***  Follow up plan: No follow-ups on file.  Kevin Boston, MSN, APRN, FNP-C [facility]  Subjective:   Patient ID: Kevin Smith, male    DOB: 1955-10-03, 66 y.o.   MRN: [id number]  HPI: Kevin Smith is a 66 y.o. male presenting on 08/31/2021 for Shortness of Breath and Cough (X 1 week )  Shortness of breath Cough non productive Semi quit smoking Has passed out more than once because he couldn't quit coughing Symbicort with Spiriva. Did fell well with Trelegy.  ***   ROS: Negative unless specifically indicated above in HPI.   Relevant past medical history reviewed and updated as indicated.   Allergies and medications reviewed and updated.   Current Outpatient Medications:    aspirin EC 81 MG EC tablet, Take 1 tablet (81 mg total) by mouth daily. (Patient taking differently: Take 81 mg by mouth every 4 (four) hours as needed.), Disp: , Rfl:    atorvastatin (LIPITOR) 80 MG tablet, Take 1 tablet by mouth once daily, Disp: 90 tablet, Rfl: 1   cariprazine (VRAYLAR) 1.5 MG capsule, Take 1 capsule (1.5 mg total) by mouth daily., Disp: 30 capsule, Rfl: 2   cetirizine (ZYRTEC) 10 MG tablet, Take 1 tablet by mouth once daily, Disp: 90 tablet, Rfl: 1   clopidogrel (PLAVIX) 75 MG tablet, Take 1 tablet by mouth once daily, Disp: 30 tablet, Rfl: 3   desvenlafaxine (PRISTIQ) 50 MG 24 hr tablet, Take 1 tablet (50 mg total) by mouth daily., Disp: 90 tablet, Rfl: 1   ezetimibe (ZETIA) 10 MG tablet, Take 1 tablet (10 mg total) by mouth daily., Disp: 90 tablet, Rfl: 1   furosemide (LASIX) 20 MG tablet, Take 1 tablet by mouth once daily, Disp: 90 tablet, Rfl: 0   ipratropium-albuterol (DUONEB) 0.5-2.5 (3) MG/3ML SOLN, Take 3 mLs by nebulization every 8 (eight) hours as needed., Disp: 360 mL, Rfl: 0   losartan (COZAAR) 100 MG tablet, Take 1 tablet (100 mg total) by mouth daily., Disp: 90 tablet, Rfl: 1   metoprolol succinate (TOPROL-XL) 25 MG 24 hr  tablet, TAKE 1 TABLET BY MOUTH ONCE DAILY . APPOINTMENT REQUIRED FOR FUTURE REFILLS, Disp: 30 tablet, Rfl: 3   nitroGLYCERIN (NITROSTAT) 0.4 MG SL tablet, Place 1 tablet (0.4 mg total) under the tongue every 5 (five) minutes x 3 doses as needed for chest pain., Disp: 25 tablet, Rfl: 0   pantoprazole (PROTONIX) 40 MG tablet, Take 1 tablet by mouth once daily, Disp: 90 tablet, Rfl: 0   rOPINIRole (REQUIP) 0.5 MG tablet, Take 0.5 tablets (0.25 mg total) by mouth at bedtime for 4 days, THEN 1 tablet (0.5 mg total) at bedtime for 28 days., Disp: 30 tablet, Rfl: 0   temazepam (RESTORIL) 15 MG capsule, Take 1 capsule (15 mg total) by mouth at bedtime as needed for sleep., Disp: 30 capsule, Rfl: 0   Tiotropium Bromide Monohydrate (SPIRIVA RESPIMAT) 2.5 MCG/ACT AERS, Inhale 2 puffs into the lungs daily., Disp: 4 g, Rfl: 2   Tiotropium Bromide Monohydrate (SPIRIVA RESPIMAT) 2.5 MCG/ACT AERS, Inhale 2 puffs into the lungs daily., Disp: 4 g, Rfl: 0   VENTOLIN HFA 108 (90 Base) MCG/ACT inhaler, INHALE 2 PUFFS BY MOUTH EVERY 6 HOURS AS NEEDED FOR WHEEZING AND FOR SHORTNESS OF BREATH, Disp: 18 g, Rfl: 2  Allergies  Allergen Reactions   Sulfa Antibiotics Rash    Objective:   BP (!) 134/94   Pulse 62   Temp 97.6 F (  36.4 C) (Temporal)   Ht 6\' 1"  (1.854 m)   Wt 233 lb 6.4 oz (105.9 kg)   SpO2 94%   BMI 30.79 kg/m    Physical Exam

## 2021-09-01 ENCOUNTER — Telehealth: Payer: Self-pay

## 2021-09-01 NOTE — Progress Notes (Signed)
Opened in error

## 2021-09-05 ENCOUNTER — Encounter: Payer: Self-pay | Admitting: Family Medicine

## 2021-09-14 ENCOUNTER — Other Ambulatory Visit: Payer: Self-pay | Admitting: Nurse Practitioner

## 2021-09-14 DIAGNOSIS — M5412 Radiculopathy, cervical region: Secondary | ICD-10-CM

## 2021-09-20 ENCOUNTER — Other Ambulatory Visit: Payer: Self-pay | Admitting: Family Medicine

## 2021-09-20 ENCOUNTER — Other Ambulatory Visit: Payer: Self-pay | Admitting: Nurse Practitioner

## 2021-09-20 DIAGNOSIS — F5101 Primary insomnia: Secondary | ICD-10-CM

## 2021-09-20 DIAGNOSIS — M5412 Radiculopathy, cervical region: Secondary | ICD-10-CM

## 2021-09-20 NOTE — Telephone Encounter (Signed)
Per my last note:  Follow-up: Return in about 4 weeks (around 09/13/2021) for Sleep & Depression.   I am okay with this being telephone if needed.

## 2021-09-22 ENCOUNTER — Ambulatory Visit (INDEPENDENT_AMBULATORY_CARE_PROVIDER_SITE_OTHER): Payer: Medicare HMO | Admitting: Pharmacist

## 2021-09-22 ENCOUNTER — Ambulatory Visit: Payer: Medicare HMO | Admitting: Pharmacist

## 2021-09-22 ENCOUNTER — Telehealth: Payer: Self-pay | Admitting: Pharmacist

## 2021-09-22 DIAGNOSIS — J449 Chronic obstructive pulmonary disease, unspecified: Secondary | ICD-10-CM

## 2021-09-22 DIAGNOSIS — J441 Chronic obstructive pulmonary disease with (acute) exacerbation: Secondary | ICD-10-CM | POA: Diagnosis not present

## 2021-09-22 DIAGNOSIS — I25709 Atherosclerosis of coronary artery bypass graft(s), unspecified, with unspecified angina pectoris: Secondary | ICD-10-CM | POA: Diagnosis not present

## 2021-09-22 MED ORDER — BREZTRI AEROSPHERE 160-9-4.8 MCG/ACT IN AERO: 2.0000 | INHALATION_SPRAY | Freq: Two times a day (BID) | RESPIRATORY_TRACT | 5 refills | Status: DC

## 2021-09-22 NOTE — Telephone Encounter (Signed)
Made appt on August 3. First available

## 2021-09-22 NOTE — Telephone Encounter (Signed)
Patient requested refill on sleeping medication (temazepam), but he will need PCP appt since it is controlled substance Please schedule patient with PCP for follow up refills

## 2021-09-22 NOTE — Progress Notes (Signed)
Chronic Care Management Pharmacy Note  09/22/2021 Name:  Kevin Smith MRN:  [id number] DOB:  Aug 04, 1955  Summary:  Chronic Obstructive Pulmonary Disease:  Goal on Track (progressing): YES. Controlled; current treatment: Breztri ; Ventolin rescue, DuoNebs PRN Reports breztri samples are helping Only using rescue albuterol (ventolin) 1-2 times per day Smoking 1 ppd; declines cessation although offered GOLD Classification:  NOT STAGED; doesn't follow with pulm Most recent Pulmonary Function Testing:  n/a 1 exacerbations requiring treatment in the last 6 months  Assessed patient finances. [place] for [employer]; 4 weeks worth of samples given  Hypertension: -medications: LOSARTAN, METOP -at goal <140/80 -smoking cessation would aid in improved hypertension control & cardiac history  Patient Goals/Self-Care Activities patient will:  - take medications as prescribed focus on medication adherence collaborate with provider on medication access solutions  Follow Up Plan: Telephone follow up appointment with care management team member scheduled for: 2 months   Subjective: Kevin Smith is an 66 y.o. year old male who is a primary patient of Loman Brooklyn, FNP.  The CCM team was consulted for assistance with disease management and care coordination needs.    Engaged with patient face to face for follow up visit in response to provider referral for pharmacy case management and/or care coordination services.   Consent to Services:  The patient was given information about Chronic Care Management services, agreed to services, and gave verbal consent prior to initiation of services.  Please see initial visit note for detailed documentation.   Patient Care Team: Loman Brooklyn, FNP as PCP - General (Family Medicine) Sherren Mocha, MD as Consulting Physician (Cardiology) Lavera Guise, [facility] (Pharmacist)  Objective:  Lab  Results  Component Value Date   CREATININE 1.49 (H) 08/16/2021   CREATININE 1.39 (H) 04/19/2021   CREATININE 1.21 12/21/2020    Lab Results  Component Value Date   HGBA1C 5.4 09/16/2018   Last diabetic Eye exam: No results found for: "HMDIABEYEEXA"  Last diabetic Foot exam: No results found for: "HMDIABFOOTEX"      Component Value Date/Time   CHOL 166 08/16/2021 1149   TRIG 127 08/16/2021 1149   HDL 56 08/16/2021 1149   CHOLHDL 3.0 08/16/2021 1149   CHOLHDL 2.8 09/16/2018 0504   VLDL 17 09/16/2018 0504   LDLCALC 88 08/16/2021 1149       Latest Ref Rng & Units 08/16/2021   11:49 AM 04/19/2021    5:08 PM 12/21/2020    3:20 PM  Hepatic Function  Total Protein 6.0 - 8.5 g/dL 8.0  7.2  7.5   Albumin 3.8 - 4.8 g/dL 4.7  4.4  4.9   AST 0 - 40 IU/L _0 ALT 0 - 44 IU/L 39  26  24   Alk Phosphatase 44 - 121 IU/L 98  107  99   Total Bilirubin 0.0 - 1.2 mg/dL 0.7  0.5  0.7     Lab Results  Component Value Date/Time   TSH 2.340 08/16/2021 11:49 AM   TSH 2.760 09/08/2020 11:17 AM       Latest Ref Rng & Units 08/18/2021    3:26 PM 08/16/2021   11:49 AM 12/21/2020    3:20 PM  CBC  WBC 4.0 - 10.5 K/uL 9.2  9.4  6.2   Hemoglobin 13.0 - 17.0 g/dL 17.3  17.8  17.3   Hematocrit 39.0 - 52.0 % 51.2  50.9  49.2  Platelets 150.0 - 400.0 K/uL 233.0  228  282     No results found for: "VD25OH"  Clinical ASCVD: Yes  The ASCVD Risk score (Arnett DK, et al., 2019) failed to calculate for the following reasons:   The patient has a prior MI or stroke diagnosis    Other: (CHADS2VASc if Afib, PHQ9 if depression, MMRC or CAT for COPD, ACT, DEXA)  Social History   Tobacco Use  Smoking Status Some Days   Packs/day: 2.00   Years: 54.00   Total pack years: 108.00   Types: Cigarettes  Smokeless Tobacco Former   Types: Chew  Tobacco Comments   0.25 ppd 08/18/21 T[place]en Dome, CMA   BP Readings from Last 3 Encounters:  08/31/21 (!) 134/94  08/18/21 102/60  08/16/21 129/87    Pulse Readings from Last 3 Encounters:  08/31/21 62  08/18/21 90  08/16/21 79   Wt Readings from Last 3 Encounters:  08/31/21 233 lb 6.4 oz (105.9 kg)  08/18/21 233 lb (105.7 kg)  08/16/21 224 lb (101.6 kg)    Assessment: Review of patient past medical history, allergies, medications, health status, including review of consultants reports, laboratory and other test data, was performed as part of comprehensive evaluation and provision of chronic care management services.   SDOH:  (Social Determinants of Health) assessments and interventions performed:    CCM Care Plan  Allergies  Allergen Reactions   Sulfa Antibiotics Rash    Medications Reviewed Today     Reviewed by Lavera Guise, [facility] (Pharmacist) on 09/22/21 at 1509  Med List Status: <None>   Medication Order Taking? Sig Documenting Provider Last Dose Status Informant  aspirin EC 81 MG EC tablet [id number] No Take 1 tablet (81 mg total) by mouth daily.  Patient taking differently: Take 81 mg by mouth every 4 (four) hours as needed.   Cheryln Manly, NP Taking Active Self  atorvastatin (LIPITOR) 80 MG tablet [id number] No Take 1 tablet by mouth once daily Strader, [place] M, PA-C Taking Active   Budeson-Glycopyrrol-Formoterol (BREZTRI AEROSPHERE) 160-9-4.8 MCG/ACT Hollie Salk [id number]  Inhale 2 puffs into the lungs 2 (two) times daily. Loman Brooklyn, FNP  Active   cariprazine (VRAYLAR) 1.5 MG capsule [id number] No Take 1 capsule (1.5 mg total) by mouth daily. Loman Brooklyn, FNP Taking Active   cetirizine (ZYRTEC) 10 MG tablet [id number] No Take 1 tablet by mouth once daily Loman Brooklyn, FNP Taking Active   clopidogrel (PLAVIX) 75 MG tablet [id number] No Take 1 tablet by mouth once daily Strader, Brittany M, PA-C Taking Active   desvenlafaxine (PRISTIQ) 50 MG 24 hr tablet [id number] No Take 1 tablet (50 mg total) by mouth daily. Loman Brooklyn, FNP Taking Active   ezetimibe (ZETIA) 10 MG tablet [id number] No Take 1  tablet (10 mg total) by mouth daily. Loman Brooklyn, FNP Taking Active   furosemide (LASIX) 20 MG tablet [id number] No Take 1 tablet by mouth once daily Hendricks Limes F, FNP Taking Active   HYDROcodone bit-homatropine (HYCODAN) 5-1.5 MG/5ML syrup [id number]  Take 5 mLs by mouth every 6 (six) hours as needed for cough. Loman Brooklyn, FNP  Active   ipratropium-albuterol (DUONEB) 0.5-2.5 (3) MG/3ML SOLN [id number] No Take 3 mLs by nebulization every 8 (eight) hours as needed. Loman Brooklyn, FNP Taking Active   losartan (COZAAR) 100 MG tablet [id number] No Take 1 tablet (100 mg total) by mouth daily. Loman Brooklyn, FNP Taking Active   metoprolol succinate (TOPROL-XL)  25 MG 24 hr tablet [id number] No TAKE 1 TABLET BY MOUTH ONCE DAILY . APPOINTMENT REQUIRED FOR FUTURE REFILLS Ahmed Prima Brittany M, PA-C Taking Active   nitroGLYCERIN (NITROSTAT) 0.4 MG SL tablet [id number] No Place 1 tablet (0.4 mg total) under the tongue every 5 (five) minutes x 3 doses as needed for chest pain. Cheryln Manly, NP Taking Active   pantoprazole (PROTONIX) 40 MG tablet [id number] No Take 1 tablet by mouth once daily Loman Brooklyn, FNP Taking Active     Discontinued 09/22/21 1509 (Completed Course)   rOPINIRole (REQUIP) 0.5 MG tablet [id number] No Take 0.5 tablets (0.25 mg total) by mouth at bedtime for 4 days, THEN 1 tablet (0.5 mg total) at bedtime for 28 days. Hendricks Limes F, FNP Taking Expired 09/18/21 2359   temazepam (RESTORIL) 15 MG capsule [id number] No Take 1 capsule (15 mg total) by mouth at bedtime as needed for sleep. 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 ([place]) 09/08/2020   Macrocytosis 03/31/2020   Gastroesophageal reflux disease 01/02/2020   Erectile dysfunction 01/02/2020   COPD with acute exacerbation ([place]) 09/15/2018   Coronary artery disease involving 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 ([place]) 03/12/2017   COPD GOLD 2  11/22/2015   Nicotine dependence, uncomplicated 65/53/7482   Aortic aneurysm ([place]) 2017   Essential hypertension, benign 01/04/2015   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   Hyperlipidemia with target LDL less than 70 12/07/2012    Immunization History  Administered Date(s) Administered   Tdap 01/06/2014    Conditions to be addressed/monitored: HTN and COPD  Care Plan : PHARMD MEDICATION MANAGEMENT  Updates made by Lavera Guise, RPH since 09/22/2021 12:00 AM     Problem: DISEASE PROGRESSION PREVENTION      Long-Range Goal: COPD, HTN PHARMD GOAL   Recent Progress: Not on track  Priority: High  Note:   Current Barriers:  Unable to independently afford treatment regimen Unable to maintain control of COPD  Pharmacist Clinical Goal(s):  patient will verbalize ability to afford treatment regimen achieve control of COPD as evidenced by IMPROVED BREATHING & QUALITY OF LIFE maintain control of COPD as evidenced by [place]  through collaboration with PharmD and provider.   Interventions: 1:1 collaboration with Loman Brooklyn, FNP regarding development and update of comprehensive plan of care as evidenced by provider attestation and co-signature Inter-disciplinary care team collaboration (see longitudinal plan of care) Comprehensive medication review performed; medication list updated in electronic medical record  Chronic Obstructive Pulmonary Disease:  Goal on Track (progressing): YES. Controlled; current treatment: Breztri ;  Ventolin rescue, DuoNebs PRN Reports breztri samples are helping Only using rescue albuterol (ventolin) 1-2 times per day Smoking 1 ppd; declines cessation although offered GOLD Classification:  NOT STAGED; doesn't follow with pulm Most recent Pulmonary Function Testing:  n/a 1 exacerbations requiring treatment in the last 6 months  Assessed patient finances. [place]  for [employer]; 4 weeks worth of samples given  Hypertension: -medications: LOSARTAN, METOP -at goal <140/80 -smoking cessation would aid in improved hypertension control & cardiac history  Patient Goals/Self-Care Activities patient will:  - take medications as prescribed focus on medication adherence collaborate with provider on medication access solutions  Follow Up Plan: Telephone follow up appointment with care management team member scheduled for: 2 months      Follow Up:  Patient agrees to Care Plan and Follow-up.  Plan: Telephone follow up appointment with care management team member scheduled for:  1 month   Regina Eck, PharmD, BCPS Clinical Pharmacist, [place]  II Phone [phone]

## 2021-09-22 NOTE — Patient Instructions (Addendum)
Visit Information  Following are the goals we discussed today:  Current Barriers:  Unable to independently afford treatment regimen Unable to maintain control of COPD  Pharmacist Clinical Goal(s):  patient will verbalize ability to afford treatment regimen achieve control of COPD as evidenced by IMPROVED BREATHING & QUALITY OF LIFE maintain control of COPD as evidenced by IMPROVED BREATHING & QUALITY OF LIFE  through collaboration with PharmD and provider.   Interventions: 1:1 collaboration with [name], FNP regarding development and update of comprehensive plan of care as evidenced by provider attestation and co-signature Inter-disciplinary care team collaboration (see longitudinal plan of care) Comprehensive medication review performed; medication list updated in electronic medical record  Chronic Obstructive Pulmonary Disease:  Goal on Track (progressing): YES. Controlled; current treatment: Breztri ; Ventolin rescue, DuoNebs PRN Reports breztri samples are helping Only using rescue albuterol (ventolin) 1-2 times per day Smoking 1 ppd; declines cessation although offered GOLD Classification:  NOT STAGED; doesn't follow with pulm Most recent Pulmonary Function Testing:  n/a 1 exacerbations requiring treatment in the last 6 months  Assessed patient finances. [place] Breztri; 4 weeks worth of samples given  Hypertension: -medications: LOSARTAN, METOP -at goal <140/80 -smoking cessation would aid in improved hypertension control & cardiac history  Patient Goals/Self-Care Activities patient will:  - take medications as prescribed focus on medication adherence collaborate with provider on medication access solutions  Follow Up Plan: Telephone follow up appointment with care management team member scheduled for: 2 months   Plan: Telephone follow up appointment with care management team member scheduled for:  3-6  months  Signature Kieth Brightly, PharmD, BCPS Clinical Pharmacist, [facility]  II Phone [phone]   Please call the care guide team at [phone] if you need to cancel or reschedule your appointment.   The patient verbalized understanding of instructions, educational materials, and care plan provided today and DECLINED offer to receive copy of patient instructions, educational materials, and care plan.

## 2021-09-26 ENCOUNTER — Other Ambulatory Visit: Payer: Self-pay | Admitting: *Deleted

## 2021-09-26 DIAGNOSIS — J449 Chronic obstructive pulmonary disease, unspecified: Secondary | ICD-10-CM

## 2021-09-26 MED ORDER — IPRATROPIUM-ALBUTEROL 0.5-2.5 (3) MG/3ML IN SOLN: 3.0000 mL | Freq: Three times a day (TID) | RESPIRATORY_TRACT | 0 refills | Status: DC | PRN

## 2021-10-04 ENCOUNTER — Other Ambulatory Visit: Payer: Self-pay | Admitting: Family Medicine

## 2021-10-04 DIAGNOSIS — I5043 Acute on chronic combined systolic (congestive) and diastolic (congestive) heart failure: Secondary | ICD-10-CM

## 2021-10-05 ENCOUNTER — Ambulatory Visit (INDEPENDENT_AMBULATORY_CARE_PROVIDER_SITE_OTHER): Payer: Medicare HMO | Admitting: Family Medicine

## 2021-10-05 ENCOUNTER — Encounter: Payer: Self-pay | Admitting: Family Medicine

## 2021-10-05 VITALS — BP 107/75 | HR 88 | Temp 97.6°F | Ht 73.0 in | Wt 227.6 lb

## 2021-10-05 DIAGNOSIS — F5101 Primary insomnia: Secondary | ICD-10-CM

## 2021-10-05 DIAGNOSIS — F419 Anxiety disorder, unspecified: Secondary | ICD-10-CM

## 2021-10-05 DIAGNOSIS — F332 Major depressive disorder, recurrent severe without psychotic features: Secondary | ICD-10-CM

## 2021-10-05 DIAGNOSIS — Z79899 Other long term (current) drug therapy: Secondary | ICD-10-CM | POA: Diagnosis not present

## 2021-10-05 DIAGNOSIS — Z596 Low income: Secondary | ICD-10-CM | POA: Diagnosis not present

## 2021-10-05 DIAGNOSIS — G2581 Restless legs syndrome: Secondary | ICD-10-CM | POA: Diagnosis not present

## 2021-10-05 MED ORDER — TEMAZEPAM 30 MG PO CAPS: 30.0000 mg | ORAL_CAPSULE | Freq: Every evening | ORAL | 5 refills | Status: DC | PRN

## 2021-10-05 MED ORDER — ROPINIROLE HCL 1 MG PO TABS: ORAL_TABLET | ORAL | 0 refills | Status: DC
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
## 2021-10-07 ENCOUNTER — Telehealth: Payer: Medicare HMO

## 2021-10-10 ENCOUNTER — Ambulatory Visit: Payer: Medicare HMO | Admitting: Internal Medicine

## 2021-10-11 LAB — TOXASSURE SELECT 13 (MW), URINE

## 2021-10-12 ENCOUNTER — Encounter: Payer: Self-pay | Admitting: Internal Medicine

## 2021-10-12 ENCOUNTER — Ambulatory Visit: Payer: Medicare HMO | Admitting: Internal Medicine

## 2021-10-12 DIAGNOSIS — F17210 Nicotine dependence, cigarettes, uncomplicated: Secondary | ICD-10-CM

## 2021-10-12 DIAGNOSIS — J449 Chronic obstructive pulmonary disease, unspecified: Secondary | ICD-10-CM

## 2021-10-12 DIAGNOSIS — R058 Other specified cough: Secondary | ICD-10-CM

## 2021-10-12 MED ORDER — FAMOTIDINE 20 MG PO TABS: ORAL_TABLET | ORAL | 11 refills | Status: DC

## 2021-10-12 MED ORDER — ALBUTEROL SULFATE (2.5 MG/3ML) 0.083% IN NEBU: 2.5000 mg | INHALATION_SOLUTION | RESPIRATORY_TRACT | 12 refills | Status: DC | PRN
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
## 2021-10-12 NOTE — Patient Instructions (Signed)
My office will be contacting you by phone for referral to Allergy   - if you don't hear back from my office within two weeks please call us back or notify us thru MyChart and we'll address it right away.   Pepcid ac 20 mg after supper nightly   Plan A = Automatic = Always=   breztri Take 2 puffs first thing in am and then another 2 puffs about 12 hours later.    Work on inhaler technique:  relax and gently blow all the way out then take a nice smooth full deep breath back in, triggering the inhaler at same time you start breathing in.  Hold breath in for at least  5 seconds if you can. Blow out breztri thru nose. Rinse and gargle with water when done.  If mouth or throat bother you at all,  try brushing teeth/gums/tongue with arm and hammer toothpaste/ make a slurry and gargle and spit out.       Plan B = Backup (to supplement plan A, not to replace it) Only use your albuterol inhaler as a rescue medication to be used if you can't catch your breath by resting or doing a relaxed purse lip breathing pattern.  - The less you use it, the better it will work when you need it. - Ok to use the inhaler up to 2 puffs  every 4 hours if you must but call for appointment if use goes up over your usual need - Don't leave home without it !!  (think of it like the spare tire for your car)   Plan C = Crisis (instead of Plan B but only if Plan B stops working) - only use your albuterol nebulizer if you first try Plan B and it fails to help > ok to use the nebulizer up to every 4 hours but if start needing it regularly call for immediate appointment   Ok to try albuterol 15 min before an activity (on alternating days inhaler vs neb vs nothing )  that you know would usually make you short of breath and see if it makes any difference and if makes none then don't take albuterol after activity unless you can't catch your breath as this means it's the resting that helps, not the albuterol.        The key is to stop  smoking completely before smoking completely stops you!  Please schedule a follow up visit in 3 months but call sooner if needed

## 2021-10-13 ENCOUNTER — Ambulatory Visit (INDEPENDENT_AMBULATORY_CARE_PROVIDER_SITE_OTHER): Payer: Medicare HMO | Admitting: Pharmacist

## 2021-10-13 ENCOUNTER — Ambulatory Visit: Payer: Medicare HMO | Admitting: Family Medicine

## 2021-10-13 ENCOUNTER — Encounter: Payer: Self-pay | Admitting: Internal Medicine

## 2021-10-13 DIAGNOSIS — I25709 Atherosclerosis of coronary artery bypass graft(s), unspecified, with unspecified angina pectoris: Secondary | ICD-10-CM

## 2021-10-13 DIAGNOSIS — J441 Chronic obstructive pulmonary disease with (acute) exacerbation: Secondary | ICD-10-CM

## 2021-10-13 NOTE — Assessment & Plan Note (Signed)
Counseled re importance of smoking cessation but did not meet time criteria for separate billing            Each maintenance medication was reviewed in detail including emphasizing most importantly the difference between maintenance and prns and under what circumstances the prns are to be triggered using an action plan format where appropriate.  Total time for H and P, chart review, counseling, reviewing hfa/neb device(s) , directly observing portions of ambulatory 02 saturation study/ and generating customized AVS unique to this office visit / same day charting = > 30 min for multiple  refractory respiratory  symptoms of uncertain etiology

## 2021-10-13 NOTE — Assessment & Plan Note (Signed)
Onset ? 2006 with dx copd /gerd -   Eos 0.1 /  IgE  382> referred to allergy 10/12/2021 and added pepcid 20 mg q pm to am ppi

## 2021-10-13 NOTE — Assessment & Plan Note (Addendum)
Active smoker / onset of symptoms around 2006 - Spirometry 08/18/2021  FEV1 2.4 (63%)  Ratio 0.68 p am symb/spiriva though technique poor - Allergy screen 08/18/2021 >  Eos 0.1 /  IgE  382 - Labs ordered 08/18/2021  :     alpha one AT phenotype  MM  Level 194 - 08/18/2021   Walked on RA  x  2  lap(s) =  approx 500  ft  @ slow due to hip pace, stopped due to hip pain s sob with lowest 02 sats 97%    - 08/18/2021  After extensive coaching inhaler device,  effectiveness =    75% symb/spriva vs breztri - 10/12/2021  After extensive coaching inhaler device,  effectiveness =  80%    - 10/12/2021   Walked on RA  x  3  lap(s) =  approx 450  ft  @ mod pace, stopped due to end of study  with lowest 02 sats 96% sob on 3rd lap      Group D (now reclassified as E) in terms of symptom/risk and laba/lama/ICS  therefore appropriate rx at this point >>>  breztri and more approp saba  Re SABA :  I spent extra time with pt today reviewing appropriate use of albuterol for prn use on exertion with the following points: 1) saba is for relief of sob that does not improve by walking a slower pace or resting but rather if the pt does not improve after trying this first. 2) If the pt is convinced, as many are, that saba helps recover from activity faster then it's easy to tell if this is the case by re-challenging : ie stop, take the inhaler, then p 5 minutes try the exact same activity (intensity of workload) that just caused the symptoms and see if they are substantially diminished or not after saba 3) if there is an activity that reproducibly causes the symptoms, try the saba 15 min before the activity on alternate days   If in fact the saba really does help, then fine to continue to use it prn but advised may need to look closer at the maintenance regimen being used to achieve better control of airways disease with exertion.   >> refer to allergy for cough with pnds though suspect most of this is due to smoking.

## 2021-10-14 NOTE — Patient Instructions (Signed)
Visit Information  Following are the goals we discussed today:  Current Barriers:  Unable to independently afford treatment regimen Unable to maintain control of COPD  Pharmacist Clinical Goal(s):  patient will verbalize ability to afford treatment regimen achieve control of COPD as evidenced by IMPROVED BREATHING & QUALITY OF LIFE maintain control of COPD as evidenced by IMPROVED BREATHING & QUALITY OF LIFE  through collaboration with PharmD and provider.   Interventions: 1:1 collaboration with [name], FNP regarding development and update of comprehensive plan of care as evidenced by provider attestation and co-signature Inter-disciplinary care team collaboration (see longitudinal plan of care) Comprehensive medication review performed; medication list updated in electronic medical record  Chronic Obstructive Pulmonary Disease:  Goal on Track (progressing): YES. Current treatment: Breztri ; Ventolin rescue, DuoNebs PRN Reports breztri samples are helping Only using rescue albuterol (ventolin) 1-2 times per day Smoking 1 ppd; declines cessation although offered GOLD Classification:  NOT STAGED; doesn't follow with pulm Most recent Pulmonary Function Testing:  n/a 1 exacerbations requiring treatment in the last 6 months  Assessed patient finances. [place] for [employer]; 4 weeks worth of samples given Application submitted for Vryalar patient assistance   Hypertension: -medications: LOSARTAN, METOP -at goal <140/80 -smoking cessation would aid in improved hypertension control & cardiac history  Patient Goals/Self-Care Activities patient will:  - take medications as prescribed focus on medication adherence collaborate with provider on medication access solutions  Follow Up Plan: Telephone follow up appointment with care management team member scheduled for: 2 months   Plan: Telephone follow up appointment with care  management team member scheduled for:  3 months  Signature Kieth Brightly, PharmD, BCPS Clinical Pharmacist, [facility]  II Phone [phone]   Please call the care guide team at [phone] if you need to cancel or reschedule your appointment.   The patient verbalized understanding of instructions, educational materials, and care plan provided today and DECLINED offer to receive copy of patient instructions, educational materials, and care plan.

## 2021-10-14 NOTE — Progress Notes (Signed)
Chronic Care Management Pharmacy Note  10/13/2021 Name:  Kevin Smith MRN:  [id number] DOB:  07/07/1955  Summary:  Chronic Obstructive Pulmonary Disease:  Goal on Track (progressing): YES. Current treatment: Breztri ; Ventolin rescue, DuoNebs PRN Reports breztri samples are helping Only using rescue albuterol (ventolin) 1-2 times per day Smoking 1 ppd; declines cessation although offered GOLD Classification:  NOT STAGED; doesn't follow with pulm Most recent Pulmonary Function Testing:  n/a 1 exacerbations requiring treatment in the last 6 months  Assessed patient finances. [place] for [employer]; 4 weeks worth of samples given Application submitted for Vryalar patient assistance   Hypertension: -medications: LOSARTAN, METOP -at goal <140/80 -smoking cessation would aid in improved hypertension control & cardiac history  Patient Goals/Self-Care Activities patient will:  - take medications as prescribed focus on medication adherence collaborate with provider on medication access solutions  Follow Up Plan: Telephone follow up appointment with care management team member scheduled for: 2 months   Subjective: Kevin Smith is an 66 y.o. year old male who is a primary patient of Loman Brooklyn, [place].  The CCM team was consulted for assistance with disease management and care coordination needs.    Engaged with patient face to face for follow up visit in response to provider referral for pharmacy case management and/or care coordination services.   Consent to Services:  The patient was given information about Chronic Care Management services, agreed to services, and gave verbal consent prior to initiation of services.  Please see initial visit note for detailed documentation.   Patient Care Team: Loman Brooklyn, FNP as PCP - General (Family Medicine) Sherren Mocha, MD as Consulting Physician (Cardiology) Lavera Guise,  [facility] (Pharmacist)   Objective:  Lab Results  Component Value Date   CREATININE 1.49 (H) 08/16/2021   CREATININE 1.39 (H) 04/19/2021   CREATININE 1.21 12/21/2020    Lab Results  Component Value Date   HGBA1C 5.4 09/16/2018   Last diabetic Eye exam: No results found for: "HMDIABEYEEXA"  Last diabetic Foot exam: No results found for: "HMDIABFOOTEX"      Component Value Date/Time   CHOL 166 08/16/2021 1149   TRIG 127 08/16/2021 1149   HDL 56 08/16/2021 1149   CHOLHDL 3.0 08/16/2021 1149   CHOLHDL 2.8 09/16/2018 0504   VLDL 17 09/16/2018 0504   LDLCALC 88 08/16/2021 1149       Latest Ref Rng & Units 08/16/2021   11:49 AM 04/19/2021    5:08 PM 12/21/2020    3:20 PM  Hepatic Function  Total Protein 6.0 - 8.5 g/dL 8.0  7.2  7.5   Albumin 3.8 - 4.8 g/dL 4.7  4.4  4.9   AST 0 - 40 IU/L 29  28  29    ALT 0 - 44 IU/L 39  26  24   Alk Phosphatase 44 - 121 IU/L 98  107  99   Total Bilirubin 0.0 - 1.2 mg/dL 0.7  0.5  0.7     Lab Results  Component Value Date/Time   TSH 2.340 08/16/2021 11:49 AM   TSH 2.760 09/08/2020 11:17 AM       Latest Ref Rng & Units 08/18/2021    3:26 PM 08/16/2021   11:49 AM 12/21/2020    3:20 PM  CBC  WBC 4.0 - 10.5 K/uL 9.2  9.4  6.2   Hemoglobin 13.0 - 17.0 g/dL 17.3  17.8  17.3   Hematocrit 39.0 - 52.0 %  51.2  50.9  49.2   Platelets 150.0 - 400.0 K/uL 233.0  228  282     No results found for: "VD25OH"  Clinical ASCVD: Yes  The ASCVD Risk score (Arnett DK, et al., 2019) failed to calculate for the following reasons:   The patient has a prior MI or stroke diagnosis    Other: (CHADS2VASc if Afib, PHQ9 if depression, MMRC or CAT for COPD, ACT, DEXA)  Social History   Tobacco Use  Smoking Status Some Days   Packs/day: 2.00   Years: 54.00   Total pack years: 108.00   Types: Cigarettes  Smokeless Tobacco Former   Types: Chew  Tobacco Comments   0.25 ppd 08/18/21 [place] Dome, CMA   BP Readings from Last 3 Encounters:  10/12/21 128/82   10/05/21 107/75  08/31/21 (!) 134/94   Pulse Readings from Last 3 Encounters:  10/12/21 84  10/05/21 88  08/31/21 62   Wt Readings from Last 3 Encounters:  10/12/21 232 lb 3.2 oz (105.3 kg)  10/05/21 227 lb 9.6 oz (103.2 kg)  08/31/21 233 lb 6.4 oz (105.9 kg)    Assessment: Review of patient past medical history, allergies, medications, health status, including review of consultants reports, laboratory and other test data, was performed as part of comprehensive evaluation and provision of chronic care management services.   SDOH:  (Social Determinants of Health) assessments and interventions performed:    CCM Care Plan  Allergies  Allergen Reactions   Sulfa Antibiotics Rash    Medications Reviewed Today     Reviewed by Tanda Rockers, MD (Physician) on 10/13/21 at [id number]  Med List Status: <None>   Medication Order Taking? Sig Documenting Provider Last Dose Status Informant  albuterol (PROVENTIL) (2.5 MG/3ML) 0.083% nebulizer solution [id number] Yes Take 3 mLs (2.5 mg total) by nebulization every 4 (four) hours as needed for wheezing or shortness of breath. Tanda Rockers, MD  Active   aspirin EC 81 MG EC tablet [id number] Yes Take 1 tablet (81 mg total) by mouth daily.  Patient taking differently: Take 81 mg by mouth every 4 (four) hours as needed.   Cheryln Manly, NP Taking Active Self  atorvastatin (LIPITOR) 80 MG tablet [id number] Yes Take 1 tablet by mouth once daily Strader, [place] M, PA-C Taking Active   Budeson-Glycopyrrol-Formoterol (BREZTRI AEROSPHERE) 160-9-4.8 MCG/ACT Hollie Salk [id number] Yes Inhale 2 puffs into the lungs 2 (two) times daily. Loman Brooklyn, FNP Taking Active   cariprazine (VRAYLAR) 1.5 MG capsule [id number] Yes Take 1 capsule (1.5 mg total) by mouth daily. Loman Brooklyn, FNP Taking Active   cetirizine (ZYRTEC) 10 MG tablet [id number] Yes Take 1 tablet by mouth once daily Loman Brooklyn, FNP Taking Active   clopidogrel (PLAVIX) 75 MG tablet  [id number] Yes Take 1 tablet by mouth once daily Strader, Brittany M, PA-C Taking Active   desvenlafaxine (PRISTIQ) 50 MG 24 hr tablet [id number] Yes Take 1 tablet (50 mg total) by mouth daily. Loman Brooklyn, FNP Taking Active   ezetimibe (ZETIA) 10 MG tablet [id number] Yes Take 1 tablet (10 mg total) by mouth daily. Loman Brooklyn, FNP Taking Active   famotidine (PEPCID) 20 MG tablet [id number] Yes One after supper Tanda Rockers, MD  Active   furosemide (LASIX) 20 MG tablet [id number] Yes Take 1 tablet by mouth once daily Loman Brooklyn, FNP Taking Active   losartan (COZAAR) 100 MG tablet [id number] Yes Take 1 tablet (100 mg total) by mouth daily. Blanch Media,  Shireen Quan, FNP Taking Active   metoprolol succinate (TOPROL-XL) 25 MG 24 hr tablet [id number] Yes TAKE 1 TABLET BY MOUTH ONCE DAILY . APPOINTMENT REQUIRED FOR FUTURE REFILLS Ahmed Prima Brittany M, PA-C Taking Active   nitroGLYCERIN (NITROSTAT) 0.4 MG SL tablet [id number] Yes Place 1 tablet (0.4 mg total) under the tongue every 5 (five) minutes x 3 doses as needed for chest pain. Cheryln Manly, NP Taking Active   pantoprazole (PROTONIX) 40 MG tablet [id number] Yes Take 1 tablet by mouth once daily Loman Brooklyn, FNP Taking Active   rOPINIRole (REQUIP) 1 MG tablet [id number] Yes Take 1 tablet (1 mg total) by mouth at bedtime for 7 days, THEN 1.5 tablets (1.5 mg total) at bedtime for 7 days, THEN 2 tablets (2 mg total) at bedtime for 14 days. Loman Brooklyn, FNP Taking Active   temazepam (RESTORIL) 30 MG capsule [id number] Yes Take 1 capsule (30 mg total) by mouth at bedtime as needed for sleep. 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 ([place]) 09/08/2020   Macrocytosis 03/31/2020   Gastroesophageal reflux disease 01/02/2020   Erectile dysfunction 01/02/2020   COPD with acute exacerbation ([place]) 09/15/2018   Coronary artery disease involving 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 ([place]) 03/12/2017   COPD GOLD 2  11/22/2015   Nicotine dependence, uncomplicated 70/78/6754   Aortic aneurysm ([place]) 2017   Essential hypertension, benign 01/04/2015   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   Hyperlipidemia with target LDL less than 70 12/07/2012    Immunization History  Administered Date(s) Administered   Tdap 01/06/2014    Conditions to be addressed/monitored: COPD and Depression  Care Plan : PHARMD MEDICATION MANAGEMENT  Updates made by Lavera Guise, RPH since 10/14/2021 12:00 AM     Problem: DISEASE PROGRESSION PREVENTION      Long-Range Goal: COPD, HTN PHARMD GOAL   Recent Progress: Not on track  Priority: High  Note:   Current Barriers:  Unable to independently afford treatment regimen Unable to maintain control of COPD  Pharmacist Clinical Goal(s):  patient will verbalize ability to afford treatment regimen achieve control of COPD as evidenced by IMPROVED BREATHING & QUALITY OF LIFE maintain control of COPD as evidenced by [place]  through collaboration with PharmD and provider.   Interventions: 1:1 collaboration with Loman Brooklyn, FNP regarding development and update of comprehensive plan of care as evidenced by provider attestation and co-signature Inter-disciplinary care team collaboration (see longitudinal plan of care) Comprehensive medication review performed; medication list updated in electronic medical record  Chronic Obstructive Pulmonary Disease:  Goal on Track (progressing):  YES. Current treatment: Breztri ; Ventolin rescue, DuoNebs PRN Reports breztri samples are helping Only using rescue albuterol (ventolin) 1-2 times per day Smoking 1 ppd; declines cessation although offered GOLD Classification:  NOT STAGED; doesn't follow with pulm Most recent Pulmonary Function Testing:  n/a 1 exacerbations requiring treatment in the last 6 months  Assessed  patient finances. [place] for [employer]; 4 weeks worth of samples given Application submitted for V[employer]alar patient assistance   Hypertension: -medications: LOSARTAN, METOP -at goal <140/80 -smoking cessation would aid in improved hypertension control & cardiac history  Patient Goals/Self-Care Activities patient will:  - take medications as prescribed focus on medication adherence collaborate with provider on medication access solutions  Follow Up Plan: Telephone follow up appointment with care management team member scheduled for: 2 months      Medication Assistance: Application for breztri, vryalar  medication assistance program. in process.  Anticipated assistance start date tbd.  See plan of care for additional detail.  Follow Up:  Patient agrees to Care Plan and Follow-up.  Plan: Telephone follow up appointment with care management team member scheduled for:  3 months   Regina Eck, PharmD, BCPS Clinical Pharmacist, [place]  II Phone [phone]

## 2021-10-21 ENCOUNTER — Ambulatory Visit (INDEPENDENT_AMBULATORY_CARE_PROVIDER_SITE_OTHER): Payer: Medicare HMO | Admitting: Family Medicine

## 2021-10-21 ENCOUNTER — Encounter: Payer: Self-pay | Admitting: Family Medicine

## 2021-10-21 DIAGNOSIS — J441 Chronic obstructive pulmonary disease with (acute) exacerbation: Secondary | ICD-10-CM

## 2021-10-21 MED ORDER — AZITHROMYCIN 250 MG PO TABS: ORAL_TABLET | ORAL | 0 refills | Status: DC

## 2021-10-21 MED ORDER — PREDNISONE 20 MG PO TABS: 40.0000 mg | ORAL_TABLET | Freq: Every day | ORAL | 0 refills | Status: AC

## 2021-10-21 NOTE — Progress Notes (Signed)
Virtual Visit  Note Due to COVID-19 pandemic this visit was conducted virtually. This visit type was conducted due to national recommendations for restrictions regarding the COVID-19 Pandemic (e.g. social distancing, sheltering in place) in an effort to limit this patient's exposure and mitigate transmission in our community. All issues noted in this document were discussed and addressed.  A physical exam was not performed with this format.  I connected with Kevin Smith on 10/21/21 at 1412 by telephone and verified that I am speaking with the correct person using two identifiers. Kevin Smith is currently located at home and no one is currently with him during the visit. The provider, Gabriel Earing, FNP is located in their office at time of visit.  I discussed the limitations, risks, security and privacy concerns of performing an evaluation and management service by telephone and the availability of in person appointments. I also discussed with the patient that there may be a patient responsible charge related to this service. The patient expressed understanding and agreed to proceed.   History and Present Illness:  Cough This is a chronic problem. The current episode started 1 to 4 weeks ago. The problem has been gradually worsening. The cough is Productive of sputum. Associated symptoms include shortness of breath (more persistant than baseline) and wheezing. Pertinent negatives include no chest pain, chills, ear congestion, ear pain, fever, headaches, myalgias, nasal congestion, postnasal drip, rhinorrhea or sore throat. The symptoms are aggravated by exercise. Risk factors for lung disease include smoking/tobacco exposure. He has tried a beta-agonist inhaler, steroid inhaler and ipratropium inhaler for the symptoms. The treatment provided mild relief. His past medical history is significant for COPD and environmental allergies.    Review of Systems  Constitutional:  Negative for chills  and fever.  HENT:  Negative for ear pain, postnasal drip, rhinorrhea and sore throat.   Respiratory:  Positive for cough, shortness of breath (more persistant than baseline) and wheezing.   Cardiovascular:  Negative for chest pain.  Musculoskeletal:  Negative for myalgias.  Neurological:  Negative for headaches.  Endo/Heme/Allergies:  Positive for environmental allergies.     Observations/Objective: Alert and oriented x 3. Able to speak in full sentences without difficulty. Coughing noted.   Pulse ox is 92% with home reading. HR is 91.  Assessment and Plan: Diagnoses and all orders for this visit:  COPD with acute exacerbation (HCC) Zpak and prednisone burst. Continue breztri. Albuterol and nebulizer prn. Plain mucinex BID. Strict return precautions given.  -     predniSONE (DELTASONE) 20 MG tablet; Take 2 tablets (40 mg total) by mouth daily with breakfast for 5 days. -     azithromycin (ZITHROMAX Z-PAK) 250 MG tablet; As directed   Follow Up Instructions: As needed.     I discussed the assessment and treatment plan with the patient. The patient was provided an opportunity to ask questions and all were answered. The patient agreed with the plan and demonstrated an understanding of the instructions.   The patient was advised to call back or seek an in-person evaluation if the symptoms worsen or if the condition fails to improve as anticipated.  The above assessment and management plan was discussed with the patient. The patient verbalized understanding of and has agreed to the management plan. Patient is aware to call the clinic if symptoms persist or worsen. Patient is aware when to return to the clinic for a follow-up visit. Patient educated on when it is appropriate to go to the emergency  department.   Time call ended:  1424  I provided 12 minutes of  non face-to-face time during this encounter.    Gabriel Earing, FNP

## 2021-10-31 ENCOUNTER — Telehealth: Payer: Self-pay | Admitting: Internal Medicine

## 2021-10-31 NOTE — Telephone Encounter (Signed)
There is none in [place] and the same group is in [place] but sounds like a front office issue and the office in [place] is different so ok to try it  -  there is another one in [place] office but they are not part of [facility] and harder to access /trade records than staying at [facility].  Allergy is not that big of an issues and fine also just to wait until next ov to review options - ok to move up if having more resp symptoms while waiting to see me or go to allergy

## 2021-10-31 NOTE — Telephone Encounter (Signed)
Called and spoke to patient and he is agreeable to reviewing symptoms at next ov and not proceeding with allergy referral for now. Nothing further needed

## 2021-10-31 NOTE — Telephone Encounter (Signed)
Pt wanted a referral to a different allergist.  Stated the person in [place] was rude due to pt needing transportation.  She wasn't going to call [place] [employer] to set up.  Please advise.   Dr. Sherene Sires please advise, is it ok to send in a referral for a different allergy specialist for pt?

## 2021-11-01 ENCOUNTER — Other Ambulatory Visit: Payer: Self-pay | Admitting: Family Medicine

## 2021-11-01 DIAGNOSIS — I5043 Acute on chronic combined systolic (congestive) and diastolic (congestive) heart failure: Secondary | ICD-10-CM

## 2021-11-02 ENCOUNTER — Ambulatory Visit: Payer: Medicare HMO | Admitting: Cardiology

## 2021-11-06 ENCOUNTER — Other Ambulatory Visit: Payer: Self-pay | Admitting: Family Medicine

## 2021-11-08 ENCOUNTER — Ambulatory Visit (INDEPENDENT_AMBULATORY_CARE_PROVIDER_SITE_OTHER): Payer: Medicare HMO | Admitting: Family Medicine

## 2021-11-08 ENCOUNTER — Encounter: Payer: Self-pay | Admitting: Family Medicine

## 2021-11-08 VITALS — BP 134/93 | HR 81 | Temp 97.0°F | Ht 73.0 in | Wt 235.0 lb

## 2021-11-08 DIAGNOSIS — G2581 Restless legs syndrome: Secondary | ICD-10-CM

## 2021-11-08 DIAGNOSIS — F332 Major depressive disorder, recurrent severe without psychotic features: Secondary | ICD-10-CM | POA: Diagnosis not present

## 2021-11-08 DIAGNOSIS — F5101 Primary insomnia: Secondary | ICD-10-CM | POA: Diagnosis not present

## 2021-11-08 DIAGNOSIS — J209 Acute bronchitis, unspecified: Secondary | ICD-10-CM

## 2021-11-08 DIAGNOSIS — F419 Anxiety disorder, unspecified: Secondary | ICD-10-CM | POA: Diagnosis not present

## 2021-11-08 DIAGNOSIS — J440 Chronic obstructive pulmonary disease with acute lower respiratory infection: Secondary | ICD-10-CM | POA: Diagnosis not present

## 2021-11-08 MED ORDER — GUAIFENESIN-CODEINE 100-10 MG/5ML PO SYRP: 5.0000 mL | ORAL_SOLUTION | Freq: Three times a day (TID) | ORAL | 0 refills | Status: DC | PRN

## 2021-11-08 MED ORDER — ZOLPIDEM TARTRATE 5 MG PO TABS: 5.0000 mg | ORAL_TABLET | Freq: Every evening | ORAL | 1 refills | Status: DC | PRN
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
## 2021-11-09 ENCOUNTER — Encounter: Payer: Self-pay | Admitting: Family Medicine

## 2021-11-09 DIAGNOSIS — F5101 Primary insomnia: Secondary | ICD-10-CM | POA: Insufficient documentation

## 2021-11-09 DIAGNOSIS — G2581 Restless legs syndrome: Secondary | ICD-10-CM | POA: Insufficient documentation

## 2021-11-09 MED ORDER — ROPINIROLE HCL 1 MG PO TABS: 2.0000 mg | ORAL_TABLET | Freq: Every day | ORAL | 1 refills | Status: DC

## 2021-11-10 DIAGNOSIS — F17210 Nicotine dependence, cigarettes, uncomplicated: Secondary | ICD-10-CM

## 2021-11-10 DIAGNOSIS — J449 Chronic obstructive pulmonary disease, unspecified: Secondary | ICD-10-CM | POA: Diagnosis not present

## 2021-11-10 DIAGNOSIS — I10 Essential (primary) hypertension: Secondary | ICD-10-CM | POA: Diagnosis not present

## 2021-11-15 ENCOUNTER — Other Ambulatory Visit: Payer: Self-pay | Admitting: Family Medicine

## 2021-11-15 DIAGNOSIS — J441 Chronic obstructive pulmonary disease with (acute) exacerbation: Secondary | ICD-10-CM

## 2021-11-15 DIAGNOSIS — J449 Chronic obstructive pulmonary disease, unspecified: Secondary | ICD-10-CM

## 2021-11-16 ENCOUNTER — Telehealth: Payer: Self-pay | Admitting: Family Medicine

## 2021-11-16 NOTE — Telephone Encounter (Signed)
Vryalar supply here Pt to pick up tomorrow

## 2021-11-28 ENCOUNTER — Telehealth: Payer: Self-pay

## 2021-11-28 ENCOUNTER — Ambulatory Visit (INDEPENDENT_AMBULATORY_CARE_PROVIDER_SITE_OTHER): Payer: Medicare HMO

## 2021-11-28 DIAGNOSIS — Z0000 Encounter for general adult medical examination without abnormal findings: Secondary | ICD-10-CM
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
## 2021-11-28 NOTE — Telephone Encounter (Signed)
Patient is calling to see if there is any type of assistance he can receive to get his Breztri inhaler.

## 2021-11-30 NOTE — Telephone Encounter (Signed)
Can you check on his breztri PAP? No rush!

## 2021-12-02 ENCOUNTER — Other Ambulatory Visit: Payer: Self-pay | Admitting: Student

## 2021-12-02 ENCOUNTER — Other Ambulatory Visit: Payer: Self-pay | Admitting: Family Medicine

## 2021-12-02 ENCOUNTER — Telehealth: Payer: Self-pay | Admitting: Family Medicine

## 2021-12-02 DIAGNOSIS — I5043 Acute on chronic combined systolic (congestive) and diastolic (congestive) heart failure: Secondary | ICD-10-CM

## 2021-12-02 NOTE — Telephone Encounter (Signed)
Does not come in 25 mg, he can cut the 50 mg in half.

## 2021-12-02 NOTE — Telephone Encounter (Signed)
  Prescription Request  12/02/2021  Is this a "Controlled Substance" medicine? No  Have you seen your PCP in the last 2 weeks? No, said he talked to [facility] about it when he had appt on 8/29 - supposed to lower dose to 25 mg   If YES, route message to pool  -  If NO, patient needs to be scheduled for appointment.  What is the name of the medication or equipment? desvenlafaxine ([place])   Have you contacted your pharmacy to request a refill? Yes    Which pharmacy would you like this sent to? [facility], [address] (Ph: [phone])   Patient notified that their request is being sent to the clinical staff for review and that they should receive a response within 2 business days.

## 2021-12-02 NOTE — Telephone Encounter (Signed)
lmtcb

## 2021-12-02 NOTE — Telephone Encounter (Signed)
Patient aware and verbalized understanding. °

## 2021-12-08 ENCOUNTER — Other Ambulatory Visit (HOSPITAL_COMMUNITY): Payer: Self-pay

## 2021-12-09 NOTE — Telephone Encounter (Signed)
Breztri PAP sent on 10/14/21 [employer] is not showing  Will fax again if you can follow!  Thanks!

## 2021-12-14 NOTE — Telephone Encounter (Signed)
Received notification from [employer] regarding approval for Kevin Smith. Patient assistance approved from 12/09/21 to 03/12/22.  ALLOW 7-10 BUSINESS DAYS FOR MEDICATION TO SHIP  Phone: [phone]

## 2021-12-21 ENCOUNTER — Ambulatory Visit: Payer: Medicare HMO | Admitting: Internal Medicine
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
## 2021-12-23 ENCOUNTER — Other Ambulatory Visit: Payer: Self-pay | Admitting: Student

## 2021-12-27 ENCOUNTER — Ambulatory Visit (HOSPITAL_COMMUNITY): Admit: 2021-12-27 | Payer: Medicare HMO | Admitting: Cardiovascular Disease

## 2021-12-27 ENCOUNTER — Encounter (HOSPITAL_COMMUNITY): Payer: Self-pay

## 2021-12-27 ENCOUNTER — Emergency Department (HOSPITAL_COMMUNITY): Payer: Medicare HMO

## 2021-12-27 ENCOUNTER — Encounter (HOSPITAL_COMMUNITY)
Admission: EM | Disposition: A | Discharge status: Home Health | Payer: Self-pay | Source: Home / Self Care | Attending: Cardiology

## 2021-12-27 ENCOUNTER — Other Ambulatory Visit: Payer: Self-pay

## 2021-12-27 ENCOUNTER — Inpatient Hospital Stay (HOSPITAL_COMMUNITY)
Admission: EM | Admit: 2021-12-27 | Discharge: 2022-01-02 | DRG: 250 | Disposition: A | Discharge status: Home Health | Payer: Medicare HMO | Attending: Internal Medicine | Admitting: Internal Medicine

## 2021-12-27 DIAGNOSIS — Z981 Arthrodesis status: Secondary | ICD-10-CM

## 2021-12-27 DIAGNOSIS — K219 Gastro-esophageal reflux disease without esophagitis: Secondary | ICD-10-CM | POA: Diagnosis present

## 2021-12-27 DIAGNOSIS — R45851 Suicidal ideations: Secondary | ICD-10-CM | POA: Diagnosis not present

## 2021-12-27 DIAGNOSIS — Z79899 Other long term (current) drug therapy: Secondary | ICD-10-CM | POA: Diagnosis not present

## 2021-12-27 DIAGNOSIS — Z7951 Long term (current) use of inhaled steroids: Secondary | ICD-10-CM

## 2021-12-27 DIAGNOSIS — I25700 Atherosclerosis of coronary artery bypass graft(s), unspecified, with unstable angina pectoris: Secondary | ICD-10-CM | POA: Diagnosis not present

## 2021-12-27 DIAGNOSIS — J9601 Acute respiratory failure with hypoxia: Secondary | ICD-10-CM | POA: Diagnosis not present

## 2021-12-27 DIAGNOSIS — H9191 Unspecified hearing loss, right ear: Secondary | ICD-10-CM | POA: Diagnosis present

## 2021-12-27 DIAGNOSIS — I714 Abdominal aortic aneurysm, without rupture, unspecified: Secondary | ICD-10-CM | POA: Diagnosis present

## 2021-12-27 DIAGNOSIS — N1831 Chronic kidney disease, stage 3a: Secondary | ICD-10-CM | POA: Diagnosis present

## 2021-12-27 DIAGNOSIS — Z951 Presence of aortocoronary bypass graft: Secondary | ICD-10-CM

## 2021-12-27 DIAGNOSIS — I5023 Acute on chronic systolic (congestive) heart failure: Secondary | ICD-10-CM | POA: Diagnosis not present

## 2021-12-27 DIAGNOSIS — G2581 Restless legs syndrome: Secondary | ICD-10-CM | POA: Diagnosis present

## 2021-12-27 DIAGNOSIS — I10 Essential (primary) hypertension: Secondary | ICD-10-CM | POA: Diagnosis present

## 2021-12-27 DIAGNOSIS — R0603 Acute respiratory distress: Secondary | ICD-10-CM | POA: Diagnosis not present

## 2021-12-27 DIAGNOSIS — I499 Cardiac arrhythmia, unspecified: Secondary | ICD-10-CM | POA: Diagnosis not present

## 2021-12-27 DIAGNOSIS — I5022 Chronic systolic (congestive) heart failure: Secondary | ICD-10-CM | POA: Diagnosis present

## 2021-12-27 DIAGNOSIS — E669 Obesity, unspecified: Secondary | ICD-10-CM | POA: Diagnosis present

## 2021-12-27 DIAGNOSIS — I255 Ischemic cardiomyopathy: Secondary | ICD-10-CM | POA: Diagnosis not present

## 2021-12-27 DIAGNOSIS — I25110 Atherosclerotic heart disease of native coronary artery with unstable angina pectoris: Secondary | ICD-10-CM | POA: Diagnosis not present

## 2021-12-27 DIAGNOSIS — N189 Chronic kidney disease, unspecified: Secondary | ICD-10-CM | POA: Diagnosis not present

## 2021-12-27 DIAGNOSIS — F17210 Nicotine dependence, cigarettes, uncomplicated: Secondary | ICD-10-CM | POA: Diagnosis present

## 2021-12-27 DIAGNOSIS — I5043 Acute on chronic combined systolic (congestive) and diastolic (congestive) heart failure: Secondary | ICD-10-CM

## 2021-12-27 DIAGNOSIS — I7121 Aneurysm of the ascending aorta, without rupture: Secondary | ICD-10-CM | POA: Diagnosis present

## 2021-12-27 DIAGNOSIS — F10139 Alcohol abuse with withdrawal, unspecified: Secondary | ICD-10-CM | POA: Diagnosis not present

## 2021-12-27 DIAGNOSIS — I252 Old myocardial infarction: Secondary | ICD-10-CM

## 2021-12-27 DIAGNOSIS — J439 Emphysema, unspecified: Secondary | ICD-10-CM | POA: Diagnosis not present

## 2021-12-27 DIAGNOSIS — F1010 Alcohol abuse, uncomplicated: Secondary | ICD-10-CM | POA: Diagnosis not present

## 2021-12-27 DIAGNOSIS — I2510 Atherosclerotic heart disease of native coronary artery without angina pectoris: Secondary | ICD-10-CM

## 2021-12-27 DIAGNOSIS — J449 Chronic obstructive pulmonary disease, unspecified: Secondary | ICD-10-CM | POA: Diagnosis present

## 2021-12-27 DIAGNOSIS — D696 Thrombocytopenia, unspecified: Secondary | ICD-10-CM | POA: Diagnosis not present

## 2021-12-27 DIAGNOSIS — G9341 Metabolic encephalopathy: Secondary | ICD-10-CM | POA: Diagnosis not present

## 2021-12-27 DIAGNOSIS — Z825 Family history of asthma and other chronic lower respiratory diseases: Secondary | ICD-10-CM

## 2021-12-27 DIAGNOSIS — R079 Chest pain, unspecified: Secondary | ICD-10-CM | POA: Diagnosis not present

## 2021-12-27 DIAGNOSIS — N179 Acute kidney failure, unspecified: Secondary | ICD-10-CM | POA: Diagnosis not present

## 2021-12-27 DIAGNOSIS — I214 Non-ST elevation (NSTEMI) myocardial infarction: Secondary | ICD-10-CM | POA: Diagnosis not present

## 2021-12-27 DIAGNOSIS — Z9861 Coronary angioplasty status: Secondary | ICD-10-CM

## 2021-12-27 DIAGNOSIS — E785 Hyperlipidemia, unspecified: Secondary | ICD-10-CM | POA: Diagnosis present

## 2021-12-27 DIAGNOSIS — Z8249 Family history of ischemic heart disease and other diseases of the circulatory system: Secondary | ICD-10-CM

## 2021-12-27 DIAGNOSIS — J984 Other disorders of lung: Secondary | ICD-10-CM | POA: Diagnosis not present

## 2021-12-27 DIAGNOSIS — J441 Chronic obstructive pulmonary disease with (acute) exacerbation: Secondary | ICD-10-CM | POA: Diagnosis not present

## 2021-12-27 DIAGNOSIS — Z91148 Patient's other noncompliance with medication regimen for other reason: Secondary | ICD-10-CM

## 2021-12-27 DIAGNOSIS — Z955 Presence of coronary angioplasty implant and graft: Secondary | ICD-10-CM

## 2021-12-27 DIAGNOSIS — Z9151 Personal history of suicidal behavior: Secondary | ICD-10-CM

## 2021-12-27 DIAGNOSIS — F32A Depression, unspecified: Secondary | ICD-10-CM | POA: Diagnosis present

## 2021-12-27 DIAGNOSIS — R0789 Other chest pain: Secondary | ICD-10-CM | POA: Diagnosis not present

## 2021-12-27 DIAGNOSIS — Z818 Family history of other mental and behavioral disorders: Secondary | ICD-10-CM

## 2021-12-27 DIAGNOSIS — I130 Hypertensive heart and chronic kidney disease with heart failure and stage 1 through stage 4 chronic kidney disease, or unspecified chronic kidney disease: Secondary | ICD-10-CM | POA: Diagnosis present

## 2021-12-27 DIAGNOSIS — I5041 Acute combined systolic (congestive) and diastolic (congestive) heart failure: Secondary | ICD-10-CM | POA: Diagnosis not present

## 2021-12-27 DIAGNOSIS — R0602 Shortness of breath: Secondary | ICD-10-CM | POA: Diagnosis not present

## 2021-12-27 DIAGNOSIS — J810 Acute pulmonary edema: Secondary | ICD-10-CM | POA: Diagnosis not present

## 2021-12-27 DIAGNOSIS — F419 Anxiety disorder, unspecified: Secondary | ICD-10-CM | POA: Diagnosis present

## 2021-12-27 DIAGNOSIS — Z599 Problem related to housing and economic circumstances, unspecified: Secondary | ICD-10-CM

## 2021-12-27 DIAGNOSIS — E66811 Obesity, class 1: Secondary | ICD-10-CM

## 2021-12-27 DIAGNOSIS — Z6833 Body mass index (BMI) 33.0-33.9, adult: Secondary | ICD-10-CM | POA: Diagnosis not present

## 2021-12-27 DIAGNOSIS — I25709 Atherosclerosis of coronary artery bypass graft(s), unspecified, with unspecified angina pectoris: Secondary | ICD-10-CM

## 2021-12-27 DIAGNOSIS — Z452 Encounter for adjustment and management of vascular access device: Secondary | ICD-10-CM | POA: Diagnosis not present

## 2021-12-27 DIAGNOSIS — I4510 Unspecified right bundle-branch block: Secondary | ICD-10-CM | POA: Diagnosis present

## 2021-12-27 DIAGNOSIS — I517 Cardiomegaly: Secondary | ICD-10-CM | POA: Diagnosis not present

## 2021-12-27 DIAGNOSIS — Z7902 Long term (current) use of antithrombotics/antiplatelets: Secondary | ICD-10-CM

## 2021-12-27 DIAGNOSIS — Z882 Allergy status to sulfonamides status: Secondary | ICD-10-CM

## 2021-12-27 DIAGNOSIS — Z5982 Transportation insecurity: Secondary | ICD-10-CM

## 2021-12-27 DIAGNOSIS — G4700 Insomnia, unspecified: Secondary | ICD-10-CM | POA: Diagnosis present

## 2021-12-27 HISTORY — PX: CORONARY BALLOON ANGIOPLASTY: CATH118233

## 2021-12-27 HISTORY — PX: LEFT HEART CATH AND CORS/GRAFTS ANGIOGRAPHY: CATH118250

## 2021-12-27 LAB — CBC
HCT: 47.4 % (ref 39.0–52.0)
Hemoglobin: 16 g/dL (ref 13.0–17.0)
MCH: 32.7 pg (ref 26.0–34.0)
MCHC: 33.8 g/dL (ref 30.0–36.0)
MCV: 96.7 fL (ref 80.0–100.0)
Platelets: 223 10*3/uL (ref 150–400)
RBC: 4.9 MIL/uL (ref 4.22–5.81)
RDW: 13.5 % (ref 11.5–15.5)
WBC: 10.5 10*3/uL (ref 4.0–10.5)
nRBC: 0 % (ref 0.0–0.2)

## 2021-12-27 LAB — BASIC METABOLIC PANEL
Anion gap: 16 — ABNORMAL HIGH (ref 5–15)
BUN: 14 mg/dL (ref 8–23)
CO2: 24 mmol/L (ref 22–32)
Calcium: 8.9 mg/dL (ref 8.9–10.3)
Chloride: 102 mmol/L (ref 98–111)
Creatinine, Ser: 1.49 mg/dL — ABNORMAL HIGH (ref 0.61–1.24)
GFR, Estimated: 52 mL/min — ABNORMAL LOW (ref >60)
Glucose, Bld: 96 mg/dL (ref 70–99)
Potassium: 3.9 mmol/L (ref 3.5–5.1)
Sodium: 142 mmol/L (ref 135–145)

## 2021-12-27 LAB — POCT ACTIVATED CLOTTING TIME
Activated Clotting Time: 311 seconds
Activated Clotting Time: 251 seconds
Activated Clotting Time: 269 seconds
Activated Clotting Time: 275 seconds
Activated Clotting Time: 203 seconds
Activated Clotting Time: 179 seconds

## 2021-12-27 LAB — HEMOGLOBIN A1C
Hgb A1c MFr Bld: 5.5 % (ref 4.8–5.6)
Mean Plasma Glucose: 111.15 mg/dL

## 2021-12-27 LAB — PROTIME-INR
INR: 1.1 (ref 0.8–1.2)
Prothrombin Time: 14.1 seconds (ref 11.4–15.2)

## 2021-12-27 LAB — HIV ANTIBODY (ROUTINE TESTING W REFLEX): HIV Screen 4th Generation wRfx: NONREACTIVE

## 2021-12-27 LAB — TROPONIN I (HIGH SENSITIVITY)
Troponin I (High Sensitivity): 64 ng/L — ABNORMAL HIGH (ref <18)
Troponin I (High Sensitivity): 162 ng/L (ref <18)
Troponin I (High Sensitivity): 996 ng/L (ref <18)

## 2021-12-27 LAB — APTT: aPTT: 37 seconds — ABNORMAL HIGH (ref 24–36)

## 2021-12-27 LAB — TSH: TSH: 2.625 u[IU]/mL (ref 0.350–4.500)

## 2021-12-27 LAB — HEPARIN LEVEL (UNFRACTIONATED): Heparin Unfractionated: 0.38 IU/mL (ref 0.30–0.70)

## 2021-12-27 SURGERY — LEFT HEART CATH AND CORS/GRAFTS ANGIOGRAPHY
Anesthesia: LOCAL

## 2021-12-27 MED ORDER — EZETIMIBE 10 MG PO TABS
10.0000 mg | ORAL_TABLET | Freq: Every day | ORAL | Status: DC
  Administered 2021-12-30 – 2022-01-02 (×4): 10 mg via ORAL
  Filled 2021-12-27 (×5): qty 1

## 2021-12-27 MED ORDER — IOHEXOL 350 MG/ML SOLN
INTRAVENOUS | Status: DC | PRN
  Administered 2021-12-27: 185 mL

## 2021-12-27 MED ORDER — ONDANSETRON HCL 4 MG/2ML IJ SOLN: 4.0000 mg | Freq: Four times a day (QID) | INTRAMUSCULAR | Status: DC | PRN

## 2021-12-27 MED ORDER — HEPARIN BOLUS VIA INFUSION
4000.0000 [IU] | Freq: Once | INTRAVENOUS | Status: AC
  Administered 2021-12-27: 4000 [IU] via INTRAVENOUS
  Filled 2021-12-27: qty 4000

## 2021-12-27 MED ORDER — CLOPIDOGREL BISULFATE 300 MG PO TABS
ORAL_TABLET | ORAL | Status: DC | PRN
  Administered 2021-12-27: 300 mg via ORAL

## 2021-12-27 MED ORDER — CLOPIDOGREL BISULFATE 75 MG PO TABS
75.0000 mg | ORAL_TABLET | Freq: Every day | ORAL | Status: DC
  Administered 2021-12-28 – 2022-01-02 (×6): 75 mg via ORAL
  Filled 2021-12-27 (×7): qty 1

## 2021-12-27 MED ORDER — LIDOCAINE HCL (PF) 1 % IJ SOLN
INTRAMUSCULAR | Status: AC
  Filled 2021-12-27: qty 30

## 2021-12-27 MED ORDER — SODIUM CHLORIDE 0.9 % WEIGHT BASED INFUSION: 1.0000 mL/kg/h | INTRAVENOUS | Status: DC

## 2021-12-27 MED ORDER — HEPARIN SODIUM (PORCINE) 1000 UNIT/ML IJ SOLN
INTRAMUSCULAR | Status: AC
  Filled 2021-12-27: qty 10

## 2021-12-27 MED ORDER — NITROGLYCERIN IN D5W 200-5 MCG/ML-% IV SOLN: 0.0000 ug/min | INTRAVENOUS | Status: DC

## 2021-12-27 MED ORDER — SODIUM CHLORIDE 0.9 % IV SOLN: 250.0000 mL | INTRAVENOUS | Status: DC | PRN

## 2021-12-27 MED ORDER — LORAZEPAM 2 MG/ML IJ SOLN
1.0000 mg | INTRAMUSCULAR | Status: DC | PRN
  Administered 2021-12-27: 1 mg via INTRAVENOUS
  Filled 2021-12-27: qty 1

## 2021-12-27 MED ORDER — ALPRAZOLAM 0.5 MG PO TABS
1.0000 mg | ORAL_TABLET | Freq: Two times a day (BID) | ORAL | Status: DC | PRN
  Administered 2021-12-27: 1 mg via ORAL

## 2021-12-27 MED ORDER — NITROGLYCERIN 1 MG/10 ML FOR IR/CATH LAB
INTRA_ARTERIAL | Status: AC
  Filled 2021-12-27: qty 10

## 2021-12-27 MED ORDER — ASPIRIN 81 MG PO CHEW
81.0000 mg | CHEWABLE_TABLET | Freq: Every day | ORAL | Status: DC
  Administered 2021-12-30 – 2022-01-02 (×4): 81 mg via ORAL
  Filled 2021-12-27 (×6): qty 1

## 2021-12-27 MED ORDER — METOPROLOL SUCCINATE ER 25 MG PO TB24
25.0000 mg | ORAL_TABLET | Freq: Every day | ORAL | Status: DC
  Filled 2021-12-27: qty 1

## 2021-12-27 MED ORDER — NITROGLYCERIN 0.4 MG SL SUBL
0.4000 mg | SUBLINGUAL_TABLET | SUBLINGUAL | Status: DC | PRN
  Administered 2021-12-27 (×3): 0.4 mg via SUBLINGUAL
  Filled 2021-12-27 (×3): qty 1

## 2021-12-27 MED ORDER — ALBUTEROL SULFATE (2.5 MG/3ML) 0.083% IN NEBU
2.5000 mg | INHALATION_SOLUTION | Freq: Four times a day (QID) | RESPIRATORY_TRACT | Status: DC | PRN
  Administered 2021-12-27 – 2021-12-28 (×3): 2.5 mg via RESPIRATORY_TRACT
  Filled 2021-12-27 (×3): qty 3

## 2021-12-27 MED ORDER — FAMOTIDINE 20 MG PO TABS
20.0000 mg | ORAL_TABLET | Freq: Every day | ORAL | Status: DC
  Administered 2021-12-27 – 2022-01-01 (×4): 20 mg via ORAL
  Filled 2021-12-27 (×5): qty 1

## 2021-12-27 MED ORDER — ZOLPIDEM TARTRATE 5 MG PO TABS
5.0000 mg | ORAL_TABLET | Freq: Every evening | ORAL | Status: DC | PRN
  Administered 2021-12-27 – 2021-12-30 (×3): 5 mg via ORAL
  Filled 2021-12-27 (×3): qty 1

## 2021-12-27 MED ORDER — HEPARIN SODIUM (PORCINE) 1000 UNIT/ML IJ SOLN
INTRAMUSCULAR | Status: DC | PRN
  Administered 2021-12-27: 2000 [IU] via INTRAVENOUS
  Administered 2021-12-27: 3000 [IU] via INTRAVENOUS
  Administered 2021-12-27: 8000 [IU] via INTRAVENOUS

## 2021-12-27 MED ORDER — ALPRAZOLAM 0.25 MG PO TABS
ORAL_TABLET | ORAL | Status: AC
  Filled 2021-12-27: qty 4

## 2021-12-27 MED ORDER — FENTANYL CITRATE PF 50 MCG/ML IJ SOSY
50.0000 ug | PREFILLED_SYRINGE | Freq: Once | INTRAMUSCULAR | Status: AC
  Administered 2021-12-27: 50 ug via INTRAVENOUS
  Filled 2021-12-27: qty 1

## 2021-12-27 MED ORDER — SODIUM CHLORIDE 0.9% FLUSH: 3.0000 mL | INTRAVENOUS | Status: DC | PRN

## 2021-12-27 MED ORDER — PANTOPRAZOLE SODIUM 40 MG PO TBEC
40.0000 mg | DELAYED_RELEASE_TABLET | Freq: Every day | ORAL | Status: DC
  Administered 2021-12-30 – 2022-01-02 (×4): 40 mg via ORAL
  Filled 2021-12-27 (×6): qty 1

## 2021-12-27 MED ORDER — MIDAZOLAM HCL 2 MG/2ML IJ SOLN
INTRAMUSCULAR | Status: AC
  Filled 2021-12-27: qty 2

## 2021-12-27 MED ORDER — HEPARIN (PORCINE) 25000 UT/250ML-% IV SOLN
1400.0000 [IU]/h | INTRAVENOUS | Status: DC
  Administered 2021-12-27: 1400 [IU]/h via INTRAVENOUS
  Filled 2021-12-27: qty 250

## 2021-12-27 MED ORDER — SODIUM CHLORIDE 0.9 % WEIGHT BASED INFUSION
3.0000 mL/kg/h | INTRAVENOUS | Status: AC
  Administered 2021-12-27: 3 mL/kg/h via INTRAVENOUS

## 2021-12-27 MED ORDER — FENTANYL CITRATE (PF) 100 MCG/2ML IJ SOLN
INTRAMUSCULAR | Status: AC
  Filled 2021-12-27: qty 2

## 2021-12-27 MED ORDER — MIDAZOLAM HCL 2 MG/2ML IJ SOLN
INTRAMUSCULAR | Status: DC | PRN
  Administered 2021-12-27 (×2): 1 mg via INTRAVENOUS
  Administered 2021-12-27: 2 mg via INTRAVENOUS

## 2021-12-27 MED ORDER — NITROGLYCERIN IN D5W 200-5 MCG/ML-% IV SOLN
0.0000 ug/min | INTRAVENOUS | Status: DC
  Administered 2021-12-27: 5 ug/min via INTRAVENOUS
  Filled 2021-12-27: qty 250

## 2021-12-27 MED ORDER — NITROGLYCERIN 0.4 MG SL SUBL
0.4000 mg | SUBLINGUAL_TABLET | SUBLINGUAL | Status: DC | PRN
  Administered 2021-12-27 (×2): 0.4 mg via SUBLINGUAL

## 2021-12-27 MED ORDER — SODIUM CHLORIDE 0.9 % IV SOLN: INTRAVENOUS | Status: AC

## 2021-12-27 MED ORDER — ASPIRIN 81 MG PO TBEC: 81.0000 mg | DELAYED_RELEASE_TABLET | Freq: Every day | ORAL | Status: DC

## 2021-12-27 MED ORDER — ACETAMINOPHEN 325 MG PO TABS
650.0000 mg | ORAL_TABLET | ORAL | Status: DC | PRN
  Administered 2021-12-30 – 2021-12-31 (×2): 650 mg via ORAL
  Filled 2021-12-27 (×2): qty 2

## 2021-12-27 MED ORDER — ROPINIROLE HCL 0.5 MG PO TABS
2.0000 mg | ORAL_TABLET | Freq: Every day | ORAL | Status: DC
  Administered 2021-12-27 – 2022-01-01 (×5): 2 mg via ORAL
  Filled 2021-12-27: qty 2
  Filled 2021-12-27 (×2): qty 4
  Filled 2021-12-27 (×3): qty 2

## 2021-12-27 MED ORDER — FUROSEMIDE 10 MG/ML IJ SOLN
INTRAMUSCULAR | Status: AC
  Filled 2021-12-27: qty 4

## 2021-12-27 MED ORDER — HYDRALAZINE HCL 20 MG/ML IJ SOLN: 10.0000 mg | INTRAMUSCULAR | Status: AC | PRN

## 2021-12-27 MED ORDER — ACETAMINOPHEN 325 MG PO TABS: 650.0000 mg | ORAL_TABLET | ORAL | Status: DC | PRN

## 2021-12-27 MED ORDER — LABETALOL HCL 5 MG/ML IV SOLN: 10.0000 mg | INTRAVENOUS | Status: AC | PRN

## 2021-12-27 MED ORDER — ALBUTEROL SULFATE (2.5 MG/3ML) 0.083% IN NEBU
2.5000 mg | INHALATION_SOLUTION | Freq: Once | RESPIRATORY_TRACT | Status: AC
  Administered 2021-12-28: 2.5 mg via RESPIRATORY_TRACT
  Filled 2021-12-27: qty 3

## 2021-12-27 MED ORDER — ALBUTEROL SULFATE (2.5 MG/3ML) 0.083% IN NEBU
INHALATION_SOLUTION | RESPIRATORY_TRACT | Status: AC
  Filled 2021-12-27: qty 3

## 2021-12-27 MED ORDER — FUROSEMIDE 10 MG/ML IJ SOLN
INTRAMUSCULAR | Status: DC | PRN
  Administered 2021-12-27: 20 mg via INTRAVENOUS

## 2021-12-27 MED ORDER — ATORVASTATIN CALCIUM 80 MG PO TABS
80.0000 mg | ORAL_TABLET | Freq: Every day | ORAL | Status: DC
  Administered 2021-12-30 – 2022-01-02 (×4): 80 mg via ORAL
  Filled 2021-12-27 (×5): qty 1
  Filled 2021-12-27: qty 2

## 2021-12-27 MED ORDER — NITROGLYCERIN 1 MG/10 ML FOR IR/CATH LAB
INTRA_ARTERIAL | Status: DC | PRN
  Administered 2021-12-27 (×2): 200 ug

## 2021-12-27 MED ORDER — LIDOCAINE HCL (PF) 1 % IJ SOLN
INTRAMUSCULAR | Status: DC | PRN
  Administered 2021-12-27: 10 mL

## 2021-12-27 MED ORDER — NITROGLYCERIN 0.4 MG SL SUBL: 0.4000 mg | SUBLINGUAL_TABLET | SUBLINGUAL | Status: DC | PRN

## 2021-12-27 MED ORDER — HEPARIN (PORCINE) IN NACL 1000-0.9 UT/500ML-% IV SOLN
INTRAVENOUS | Status: DC | PRN
  Administered 2021-12-27 (×2): 500 mL

## 2021-12-27 MED ORDER — FENTANYL CITRATE PF 50 MCG/ML IJ SOSY
50.0000 ug | PREFILLED_SYRINGE | INTRAMUSCULAR | Status: AC | PRN
  Administered 2021-12-27 (×3): 50 ug via INTRAVENOUS
  Filled 2021-12-27 (×3): qty 1

## 2021-12-27 MED ORDER — SODIUM CHLORIDE 0.9% FLUSH
3.0000 mL | Freq: Two times a day (BID) | INTRAVENOUS | Status: DC
  Administered 2021-12-27 – 2022-01-02 (×9): 3 mL via INTRAVENOUS

## 2021-12-27 MED ORDER — FENTANYL CITRATE (PF) 100 MCG/2ML IJ SOLN
INTRAMUSCULAR | Status: DC | PRN
  Administered 2021-12-27 (×3): 25 ug via INTRAVENOUS

## 2021-12-27 MED ORDER — HEPARIN (PORCINE) IN NACL 1000-0.9 UT/500ML-% IV SOLN
INTRAVENOUS | Status: AC
  Filled 2021-12-27: qty 1000

## 2021-12-27 SURGICAL SUPPLY — 26 items
BALLN SAPPHIRE 1.5X10 (BALLOONS) ×1
BALLN SAPPHIRE 2.0X15 (BALLOONS) ×1
BALLN SCOREFLEX 2.0X10 (BALLOONS) ×1
BALLN WOLVERINE 2.50X10 (BALLOONS) ×1
BALLOON SAPPHIRE 1.5X10 (BALLOONS) IMPLANT
BALLOON SAPPHIRE 2.0X15 (BALLOONS) IMPLANT
BALLOON SCOREFLEX 2.0X10 (BALLOONS) IMPLANT
BALLOON WOLVERINE 2.50X10 (BALLOONS) IMPLANT
CATH INFINITI 5 FR IM (CATHETERS) IMPLANT
CATH INFINITI 5 FR JL6.0 (CATHETERS) IMPLANT
CATH INFINITI JR4 5F (CATHETERS) IMPLANT
CATH LAUNCHER 6FR AL3 (CATHETERS) IMPLANT
CATH LAUNCHER 6FR JL6 (CATHETERS) IMPLANT
ELECT DEFIB PAD ADLT CADENCE (PAD) IMPLANT
KIT ENCORE 26 ADVANTAGE (KITS) IMPLANT
KIT HEART LEFT (KITS) ×1 IMPLANT
PACK CARDIAC CATHETERIZATION (CUSTOM PROCEDURE TRAY) ×1 IMPLANT
SHEATH PINNACLE 5F 10CM (SHEATH) IMPLANT
SHEATH PINNACLE 6F 10CM (SHEATH) IMPLANT
SHEATH PROBE COVER 6X72 (BAG) IMPLANT
TRANSDUCER W/STOPCOCK (MISCELLANEOUS) ×1 IMPLANT
TUBING CIL FLEX 10 FLL-RA (TUBING) ×1 IMPLANT
WIRE COUGAR XT STRL 190CM (WIRE) IMPLANT
WIRE EMERALD 3MM-J .035X150CM (WIRE) IMPLANT
WIRE EMERALD 3MM-J .035X260CM (WIRE) IMPLANT
WIRE PT2 MS 185 (WIRE) IMPLANT

## 2021-12-27 NOTE — ED Notes (Signed)
IV team at bedside 

## 2021-12-27 NOTE — Progress Notes (Signed)
Site area: right groin  Site Prior to Removal:  Level 0  Pressure Applied For 20 MINUTES    Minutes Beginning at 1850  Manual:   Yes.    Patient Status During Pull:  Stable  Post Pull Groin Site:  Level 0  Post Pull Instructions Given:  Yes.    Post Pull Pulses Present:  Yes.    Dressing Applied:  Yes.    Comments:  Bed rest started at 1910  X  4 hr.

## 2021-12-27 NOTE — Interval H&P Note (Signed)
Cath Lab Visit (complete for each Cath Lab visit)  Clinical Evaluation Leading to the Procedure:   ACS: Yes.    Non-ACS:    Anginal Classification: CCS IV  Anti-ischemic medical therapy: Maximal Therapy (2 or more classes of medications)  Non-Invasive Test Results: No non-invasive testing performed  Prior CABG: Previous CABG      History and Physical Interval Note:  12/27/2021 1:18 PM  Kevin Smith  has presented today for surgery, with the diagnosis of nstemi.  The various methods of treatment have been discussed with the patient and family. After consideration of risks, benefits and other options for treatment, the patient has consented to  Procedure(s): LEFT HEART CATH AND CORS/GRAFTS ANGIOGRAPHY (N/A) as a surgical intervention.  The patient's history has been reviewed, patient examined, no change in status, stable for surgery.  I have reviewed the patient's chart and labs.  Questions were answered to the patient's satisfaction.     Shelva Majestic

## 2021-12-27 NOTE — ED Triage Notes (Signed)
Pt to ED via [facility] from home.  Pt states he was laying in bed and couldn't get comfortable when his chest and left arm started hurting.  Pt states he did have nausea but it is now resolved. Pt has SOB which is normal for him. Pt received ASA 324mg  by EMS.   EMS VS  157/72 HR=72
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
## 2021-12-27 NOTE — ED Notes (Signed)
EDP notified of pts continued chest and left arm pain

## 2021-12-27 NOTE — Progress Notes (Signed)
Male Purewick placed, peri care given, tolerated well, suction in place, safety maintained

## 2021-12-27 NOTE — ED Notes (Signed)
Pt transported to cath lab at this time.
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
## 2021-12-28 ENCOUNTER — Encounter (HOSPITAL_COMMUNITY): Payer: Self-pay | Admitting: Cardiovascular Disease

## 2021-12-28 ENCOUNTER — Inpatient Hospital Stay (HOSPITAL_COMMUNITY): Payer: Medicare HMO

## 2021-12-28 DIAGNOSIS — I5041 Acute combined systolic (congestive) and diastolic (congestive) heart failure: Secondary | ICD-10-CM | POA: Diagnosis not present

## 2021-12-28 DIAGNOSIS — J9601 Acute respiratory failure with hypoxia: Secondary | ICD-10-CM | POA: Diagnosis not present

## 2021-12-28 DIAGNOSIS — I214 Non-ST elevation (NSTEMI) myocardial infarction: Secondary | ICD-10-CM

## 2021-12-28 DIAGNOSIS — J810 Acute pulmonary edema: Secondary | ICD-10-CM | POA: Diagnosis not present

## 2021-12-28 DIAGNOSIS — R079 Chest pain, unspecified: Secondary | ICD-10-CM

## 2021-12-28 DIAGNOSIS — J441 Chronic obstructive pulmonary disease with (acute) exacerbation: Secondary | ICD-10-CM

## 2021-12-28 DIAGNOSIS — I25110 Atherosclerotic heart disease of native coronary artery with unstable angina pectoris: Secondary | ICD-10-CM | POA: Diagnosis not present

## 2021-12-28 DIAGNOSIS — Z9861 Coronary angioplasty status: Secondary | ICD-10-CM

## 2021-12-28 LAB — MRSA NEXT GEN BY PCR, NASAL: MRSA by PCR Next Gen: NOT DETECTED

## 2021-12-28 LAB — HEPATIC FUNCTION PANEL
ALT: 34 U/L (ref 0–44)
AST: 93 U/L — ABNORMAL HIGH (ref 15–41)
Albumin: 3.6 g/dL (ref 3.5–5.0)
Alkaline Phosphatase: 58 U/L (ref 38–126)
Bilirubin, Direct: 0.2 mg/dL (ref 0.0–0.2)
Indirect Bilirubin: 0.6 mg/dL (ref 0.3–0.9)
Total Bilirubin: 0.8 mg/dL (ref 0.3–1.2)
Total Protein: 6.9 g/dL (ref 6.5–8.1)

## 2021-12-28 LAB — LIPID PANEL
Cholesterol: 128 mg/dL (ref 0–200)
HDL: 43 mg/dL (ref >40)
LDL Cholesterol: 54 mg/dL (ref 0–99)
Total CHOL/HDL Ratio: 3 RATIO
Triglycerides: 157 mg/dL — ABNORMAL HIGH (ref <150)
VLDL: 31 mg/dL (ref 0–40)

## 2021-12-28 LAB — BLOOD GAS, ARTERIAL
Acid-Base Excess: 4.1 mmol/L — ABNORMAL HIGH (ref 0.0–2.0)
Bicarbonate: 27.4 mmol/L (ref 20.0–28.0)
O2 Saturation: 97.1 %
Patient temperature: 36.7
pCO2 arterial: 36 mmHg (ref 32–48)
pH, Arterial: 7.49 — ABNORMAL HIGH (ref 7.35–7.45)
pO2, Arterial: 75 mmHg — ABNORMAL LOW (ref 83–108)

## 2021-12-28 LAB — CBC
HCT: 40 % (ref 39.0–52.0)
Hemoglobin: 14 g/dL (ref 13.0–17.0)
MCH: 32.2 pg (ref 26.0–34.0)
MCHC: 35 g/dL (ref 30.0–36.0)
MCV: 92 fL (ref 80.0–100.0)
Platelets: 133 10*3/uL — ABNORMAL LOW (ref 150–400)
RBC: 4.35 MIL/uL (ref 4.22–5.81)
RDW: 13.8 % (ref 11.5–15.5)
WBC: 8.5 10*3/uL (ref 4.0–10.5)
nRBC: 0 % (ref 0.0–0.2)

## 2021-12-28 LAB — BASIC METABOLIC PANEL
Anion gap: 9 (ref 5–15)
BUN: 14 mg/dL (ref 8–23)
CO2: 25 mmol/L (ref 22–32)
Calcium: 8.6 mg/dL — ABNORMAL LOW (ref 8.9–10.3)
Chloride: 105 mmol/L (ref 98–111)
Creatinine, Ser: 1.37 mg/dL — ABNORMAL HIGH (ref 0.61–1.24)
GFR, Estimated: 57 mL/min — ABNORMAL LOW (ref >60)
Glucose, Bld: 110 mg/dL — ABNORMAL HIGH (ref 70–99)
Potassium: 3.5 mmol/L (ref 3.5–5.1)
Sodium: 139 mmol/L (ref 135–145)

## 2021-12-28 LAB — ECHOCARDIOGRAM COMPLETE
Area-P 1/2: 2.61 cm2
Height: 71 in
S' Lateral: 5.5 cm
Weight: 2754.87 oz

## 2021-12-28 LAB — COOXEMETRY PANEL
Carboxyhemoglobin: 1.4 % (ref 0.5–1.5)
Methemoglobin: 0.9 % (ref 0.0–1.5)
O2 Saturation: 68 %
Total hemoglobin: 15.2 g/dL (ref 12.0–16.0)

## 2021-12-28 LAB — LACTIC ACID, PLASMA: Lactic Acid, Venous: 1.2 mmol/L (ref 0.5–1.9)

## 2021-12-28 LAB — PROCALCITONIN: Procalcitonin: <0.1 ng/mL

## 2021-12-28 MED ORDER — FUROSEMIDE 10 MG/ML IJ SOLN: 40.0000 mg | Freq: Once | INTRAMUSCULAR | Status: DC

## 2021-12-28 MED ORDER — ALBUTEROL SULFATE (2.5 MG/3ML) 0.083% IN NEBU: 2.5000 mg | INHALATION_SOLUTION | Freq: Once | RESPIRATORY_TRACT | Status: DC | PRN

## 2021-12-28 MED ORDER — ADULT MULTIVITAMIN W/MINERALS CH
1.0000 | ORAL_TABLET | Freq: Every day | ORAL | Status: DC
  Filled 2021-12-28: qty 1

## 2021-12-28 MED ORDER — HEPARIN (PORCINE) 25000 UT/250ML-% IV SOLN
1800.0000 [IU]/h | INTRAVENOUS | Status: DC
  Administered 2021-12-29: 1400 [IU]/h via INTRAVENOUS
  Administered 2021-12-30: 1800 [IU]/h via INTRAVENOUS
  Filled 2021-12-28 (×3): qty 250

## 2021-12-28 MED ORDER — ENOXAPARIN SODIUM 40 MG/0.4ML IJ SOSY: 40.0000 mg | PREFILLED_SYRINGE | INTRAMUSCULAR | Status: DC

## 2021-12-28 MED ORDER — DEXMEDETOMIDINE HCL IN NACL 400 MCG/100ML IV SOLN
0.4000 ug/kg/h | INTRAVENOUS | Status: DC
  Administered 2021-12-28: 0.4 ug/kg/h via INTRAVENOUS
  Administered 2021-12-29: 0.6 ug/kg/h via INTRAVENOUS
  Filled 2021-12-28 (×2): qty 100

## 2021-12-28 MED ORDER — FUROSEMIDE 10 MG/ML IJ SOLN
40.0000 mg | Freq: Once | INTRAMUSCULAR | Status: AC
  Administered 2021-12-28: 40 mg via INTRAVENOUS

## 2021-12-28 MED ORDER — SODIUM CHLORIDE 0.9 % IV SOLN: INTRAVENOUS | Status: DC | PRN

## 2021-12-28 MED ORDER — POTASSIUM CHLORIDE 10 MEQ/50ML IV SOLN
10.0000 meq | INTRAVENOUS | Status: AC
  Administered 2021-12-28 (×3): 10 meq via INTRAVENOUS
  Filled 2021-12-28 (×3): qty 50

## 2021-12-28 MED ORDER — SODIUM CHLORIDE 0.9% FLUSH
10.0000 mL | INTRAVENOUS | Status: DC | PRN
  Administered 2021-12-30 (×2): 10 mL

## 2021-12-28 MED ORDER — POTASSIUM CHLORIDE CRYS ER 20 MEQ PO TBCR: 40.0000 meq | EXTENDED_RELEASE_TABLET | Freq: Once | ORAL | Status: DC

## 2021-12-28 MED ORDER — NOREPINEPHRINE 4 MG/250ML-% IV SOLN
0.0000 ug/min | INTRAVENOUS | Status: DC
  Administered 2021-12-28: 2 ug/min via INTRAVENOUS

## 2021-12-28 MED ORDER — METHYLPREDNISOLONE SODIUM SUCC 125 MG IJ SOLR
INTRAMUSCULAR | Status: AC
  Filled 2021-12-28: qty 2

## 2021-12-28 MED ORDER — CHLORHEXIDINE GLUCONATE CLOTH 2 % EX PADS: 6.0000 | MEDICATED_PAD | Freq: Every day | CUTANEOUS | Status: DC

## 2021-12-28 MED ORDER — MILRINONE LACTATE IN DEXTROSE 20-5 MG/100ML-% IV SOLN
0.1250 ug/kg/min | INTRAVENOUS | Status: DC
  Administered 2021-12-28 – 2021-12-29 (×2): 0.25 ug/kg/min via INTRAVENOUS
  Filled 2021-12-28 (×2): qty 100

## 2021-12-28 MED ORDER — LORAZEPAM 1 MG PO TABS
1.0000 mg | ORAL_TABLET | ORAL | Status: DC | PRN
  Administered 2021-12-29: 4 mg via ORAL
  Filled 2021-12-28: qty 4

## 2021-12-28 MED ORDER — NICOTINE 21 MG/24HR TD PT24
21.0000 mg | MEDICATED_PATCH | Freq: Every day | TRANSDERMAL | Status: DC
  Administered 2021-12-28 – 2022-01-02 (×6): 21 mg via TRANSDERMAL
  Filled 2021-12-28 (×6): qty 1

## 2021-12-28 MED ORDER — PERFLUTREN LIPID MICROSPHERE
1.0000 mL | INTRAVENOUS | Status: AC | PRN
  Administered 2021-12-28: 4 mL via INTRAVENOUS

## 2021-12-28 MED ORDER — SPIRONOLACTONE 25 MG PO TABS
25.0000 mg | ORAL_TABLET | Freq: Every day | ORAL | Status: DC
  Administered 2021-12-30 – 2022-01-02 (×4): 25 mg via ORAL
  Filled 2021-12-28 (×5): qty 1

## 2021-12-28 MED ORDER — DEXTROSE 5 % IV SOLN
500.0000 mg | Freq: Once | INTRAVENOUS | Status: AC
  Administered 2021-12-28: 500 mg via INTRAVENOUS
  Filled 2021-12-28: qty 500

## 2021-12-28 MED ORDER — FUROSEMIDE 10 MG/ML IJ SOLN
INTRAMUSCULAR | Status: AC
  Filled 2021-12-28: qty 4

## 2021-12-28 MED ORDER — LORAZEPAM 2 MG/ML IJ SOLN
1.0000 mg | INTRAMUSCULAR | Status: DC | PRN
  Administered 2021-12-28: 4 mg via INTRAVENOUS
  Administered 2021-12-28: 1 mg via INTRAVENOUS
  Administered 2021-12-28 (×5): 2 mg via INTRAVENOUS
  Administered 2021-12-29: 4 mg via INTRAVENOUS
  Administered 2021-12-29 (×3): 2 mg via INTRAVENOUS
  Filled 2021-12-28 (×2): qty 1
  Filled 2021-12-28: qty 2
  Filled 2021-12-28 (×10): qty 1

## 2021-12-28 MED ORDER — SODIUM CHLORIDE 0.9% FLUSH: 3.0000 mL | Freq: Two times a day (BID) | INTRAVENOUS | Status: DC

## 2021-12-28 MED ORDER — THIAMINE HCL 100 MG/ML IJ SOLN
100.0000 mg | Freq: Every day | INTRAMUSCULAR | Status: DC
  Administered 2021-12-29: 100 mg via INTRAVENOUS
  Filled 2021-12-28: qty 2

## 2021-12-28 MED ORDER — SODIUM CHLORIDE 0.9 % IV SOLN
250.0000 mL | INTRAVENOUS | Status: DC | PRN
  Administered 2021-12-28: 250 mL via INTRAVENOUS

## 2021-12-28 MED ORDER — IPRATROPIUM-ALBUTEROL 0.5-2.5 (3) MG/3ML IN SOLN
3.0000 mL | Freq: Once | RESPIRATORY_TRACT | Status: AC
  Administered 2021-12-28: 3 mL via RESPIRATORY_TRACT
  Filled 2021-12-28: qty 3

## 2021-12-28 MED ORDER — ASPIRIN 300 MG RE SUPP
300.0000 mg | Freq: Once | RECTAL | Status: AC
  Administered 2021-12-28: 300 mg via RECTAL
  Filled 2021-12-28: qty 1

## 2021-12-28 MED ORDER — METHYLPREDNISOLONE SODIUM SUCC 125 MG IJ SOLR
125.0000 mg | Freq: Once | INTRAMUSCULAR | Status: AC
  Administered 2021-12-28: 125 mg via INTRAVENOUS

## 2021-12-28 MED ORDER — CHLORHEXIDINE GLUCONATE CLOTH 2 % EX PADS
6.0000 | MEDICATED_PAD | Freq: Every day | CUTANEOUS | Status: DC
  Administered 2021-12-28 – 2021-12-30 (×3): 6 via TOPICAL

## 2021-12-28 MED ORDER — FUROSEMIDE 10 MG/ML IJ SOLN
80.0000 mg | Freq: Two times a day (BID) | INTRAMUSCULAR | Status: DC
  Administered 2021-12-28: 80 mg via INTRAVENOUS
  Filled 2021-12-28: qty 8

## 2021-12-28 MED ORDER — FOLIC ACID 1 MG PO TABS
1.0000 mg | ORAL_TABLET | Freq: Every day | ORAL | Status: DC
  Administered 2021-12-30 – 2021-12-31 (×2): 1 mg via ORAL
  Filled 2021-12-28 (×2): qty 1

## 2021-12-28 MED ORDER — THIAMINE MONONITRATE 100 MG PO TABS
100.0000 mg | ORAL_TABLET | Freq: Every day | ORAL | Status: DC
  Administered 2021-12-30 – 2021-12-31 (×2): 100 mg via ORAL
  Filled 2021-12-28 (×3): qty 1

## 2021-12-28 MED ORDER — SODIUM CHLORIDE 0.9% FLUSH
10.0000 mL | Freq: Two times a day (BID) | INTRAVENOUS | Status: DC
  Administered 2021-12-28 – 2022-01-02 (×5): 10 mL

## 2021-12-28 MED ORDER — SODIUM CHLORIDE 0.9% FLUSH: 3.0000 mL | INTRAVENOUS | Status: DC | PRN

## 2021-12-28 MED ORDER — NOREPINEPHRINE 4 MG/250ML-% IV SOLN
INTRAVENOUS | Status: AC
  Filled 2021-12-28: qty 250

## 2021-12-28 MED ORDER — SODIUM CHLORIDE 0.9 % IV SOLN
2.0000 g | INTRAVENOUS | Status: DC
  Administered 2021-12-28: 2 g via INTRAVENOUS
  Filled 2021-12-28: qty 20

## 2021-12-28 NOTE — Progress Notes (Signed)
ABG drawn and sent to the lab.  Pt is belligerent and asking for food and water.  It was explained to him the safety issues involved with being on BiPAP and that as when we can get him off of the BiPAP he can then have food and water.  Pt continues to be angry and cursing at this Probation officer.

## 2021-12-28 NOTE — Progress Notes (Signed)
Patient considerably anxious and restless.  Patient stating he does not have any pain.  VSS.  Patient states that he drinks 6-8 beers per day every day. Patient with mild confusion. MD notified and CIWA protocol ordered.

## 2021-12-28 NOTE — Procedures (Signed)
Central Venous Catheter Insertion Procedure Note  Kevin Smith  [id number]  May 20, 1955  Date:12/28/21  Time:4:12 PM   Provider Performing:Keiden Deskin Jerilynn Mages Dewaine Oats   Procedure: Insertion of Non-tunneled Central Venous [phone]) with US guidance ([id number])   Indication(s) Medication administration  Consent Risks of the procedure as well as the alternatives and risks of each were explained to the patient and/or caregiver.  Consent for the procedure was obtained and is signed in the bedside chart  Anesthesia Topical only with 1% lidocaine   Timeout Verified patient identification, verified procedure, site/side was marked, verified correct patient position, special equipment/implants available, medications/allergies/relevant history reviewed, required imaging and test results available.  Sterile Technique Maximal sterile technique including full sterile barrier drape, hand hygiene, sterile gown, sterile gloves, mask, hair covering, sterile ultrasound probe cover (if used).  Procedure Description Area of catheter insertion was cleaned with chlorhexidine and draped in sterile fashion.  With real-time ultrasound guidance a central venous catheter was placed into the right internal jugular vein. Nonpulsatile blood flow and easy flushing noted in all ports.  The catheter was sutured in place and sterile dressing applied.  Complications/Tolerance None; patient tolerated the procedure well. Chest X-ray is ordered to verify placement for internal jugular or subclavian cannulation.   Chest x-ray is not ordered for femoral cannulation.  EBL Minimal  Specimen(s) None

## 2021-12-28 NOTE — TOC Progression Note (Signed)
Transition of Care [facility]) - Progression Note    Patient Details  Name: Yon Schiffman MRN: [id number] Date of Birth: 31-Dec-1955  Transition of Care [facility]) CM/SW Contact  Zenon Mayo, RN Phone Number: 12/28/2021, 2:08 PM  Clinical Narrative:    From home, NSTEMI, diff breathing, hx COPD, Asthma, s/p cath but needs to go back to cath lab when breathing is more stable, conts on bipap, mirinone, and IV lasix. Etoh abuse, on CIWA .  TOC following.         Expected Discharge Plan and Services                                                 Social Determinants of Health (SDOH) Interventions    Readmission Risk Interventions     No data to display

## 2021-12-28 NOTE — Progress Notes (Signed)
Heparin gtt held per pharmacy. Patient did receive Plavix p.o. this morning and aspirin suppository this evening.

## 2021-12-28 NOTE — Progress Notes (Signed)
   12/28/21 1615  Clinical Encounter Type  Visited With Family  Visit Type Initial;Critical Care;Post-op  Referral From Other (Comment) (Rounding)  Consult/Referral To Chaplain (Kairo Laubacher [employer])  Spiritual Encounters  Spiritual Needs Emotional   Chaplain met with daughter: Ms. Caliber Landess, in 2-Heart Waiting area; providing meaningful presence and reflective listening and emotional support. [address] [place], [place] [phone]

## 2021-12-28 NOTE — Progress Notes (Incomplete)
Pt was dyspneic w/ RR in 30s w/ WOB. RN gave prn neb, called Rapid Response RN, who came to bedside. RN paged PA who came to bedside and ordered 40 mg lasix, which RN administered. RT came to bedside and put pt on BIPAP. MD to bedside.   Pt resting comfortably. RN continuing to monitor.   Approx. 1115, pt breathing normally and in no distress. Pt urinated after lasix and asking for water. RN checked with RR RN who said to give pt a couple sips of water, and if tolerates being off BIPAP, he can eat and drink normally in 15 mins.  PT did not tolerate being off BIPAP for more than approx 20 mins, dyspneic, sats mid 80s, agitated, had to sit up on side of bed b/c he said he felt like he couldn't breathe. Pt refused to wear BIPAP again. RN called RR and RT, who came immediately to bedside. RN gave 2 mg Ativan and RT placed pt back  RN, RR RN, and RT transferred pt to [place].

## 2021-12-28 NOTE — Progress Notes (Signed)
Patient stating chest pain has returned and appears overall uncomfortable.  Patient nitro increased to 53mcg.  EKG completed and MD notified. VSS. Patient also appears anxious.  Orders given for prn ativan.  After receiving ativan, pt states chest pain has resolved.

## 2021-12-28 NOTE — Progress Notes (Signed)
Patient audible wheezing. MD notified. Additional orders placed for duoneb.
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
## 2021-12-28 NOTE — Progress Notes (Addendum)
Rounding Note    Patient Name: Cleofas Hudgins Date of Encounter: 12/28/2021  [facility] Cardiologist: None   Subjective   Called to bedside this morning with patient having increased work of breathing. O2 sats in the 80s.   No chest pain  RR RN at the bedside, RT placing on Bipap. Overnight did experience restlessness along with wheezing. He received duoneb but worsening hypoxia this morning.   (Patient was on BiPAP when I saw him this morning.  Starting to be more comfortable at that time.  Decreased work of breathing.  Chest x-ray showed pulmonary edema)  Inpatient Medications    Scheduled Meds:  albuterol  2.5 mg Nebulization Once   aspirin  81 mg Oral Daily   atorvastatin  80 mg Oral Daily   clopidogrel  75 mg Oral Q breakfast   ezetimibe  10 mg Oral Daily   famotidine  20 mg Oral Daily   folic acid  1 mg Oral Daily   furosemide       metoprolol succinate  25 mg Oral Daily   multivitamin with minerals  1 tablet Oral Daily   pantoprazole  40 mg Oral Daily   rOPINIRole  2 mg Oral QHS   sodium chloride flush  3 mL Intravenous Q12H   thiamine  100 mg Oral Daily   Or   thiamine  100 mg Intravenous Daily   Continuous Infusions:  nitroGLYCERIN 15 mcg/min (12/27/21 2327)   PRN Meds: acetaminophen, albuterol, furosemide, LORazepam **OR** LORazepam, nitroGLYCERIN, ondansetron (ZOFRAN) IV, zolpidem   Vital Signs    Vitals:   12/28/21 0726 12/28/21 0815 12/28/21 0830 12/28/21 0835  BP: 117/68 111/78    Pulse: 77 85 81 84  Resp: (!) 27 17 18  (!) 29  Temp:      TempSrc:      SpO2: 91% (!) 88% (!) 88% 93%  Weight:      Height:        Intake/Output Summary (Last 24 hours) at 12/28/2021 0836 Last data filed at 12/28/2021 0000 Gross per 24 hour  Intake 40.83 ml  Output 2250 ml  Net -2209.17 ml      12/27/2021    7:37 PM 12/27/2021    4:56 AM 11/08/2021    3:30 PM  Last 3 Weights  Weight (lbs) 172 lb 2.9 oz 230 lb 235 lb  Weight (kg) 78.1 kg  104.327 kg 106.595 kg      Telemetry    Sinus Rhythm - Personally Reviewed  ECG    Sinus Rhythm, 77 bpm RBBB slight ST elevated in inferior leads  - Personally Reviewed  Physical Exam   GEN: Notably dyspneic, clammy Neck: + JVD Cardiac: RRR, no murmurs, rubs, or gallops.  Respiratory: Prominent expiratory wheezing with crackles midway up lung fields GI: Soft, nontender, non-distended  MS: No edema; No deformity. Neuro:  Nonfocal  Psych: Normal affect   Labs    High Sensitivity Troponin:   Recent Labs  Lab 12/27/21 0520 12/27/21 0615 12/27/21 1225  TROPONINIHS 64* 162* 996*     Chemistry Recent Labs  Lab 12/27/21 0520 12/28/21 0015  NA 142 139  K 3.9 3.5  CL 102 105  CO2 24 25  GLUCOSE 96 110*  BUN 14 14  CREATININE 1.49* 1.37*  CALCIUM 8.9 8.6*  GFRNONAA 52* 57*  ANIONGAP 16* 9    Lipids  Recent Labs  Lab 12/28/21 0015  CHOL 128  TRIG 157*  HDL 43  LDLCALC 54  CHOLHDL 3.0    Hematology Recent Labs  Lab 12/27/21 0520 12/28/21 0015  WBC 10.5 8.5  RBC 4.90 4.35  HGB 16.0 14.0  HCT 47.4 40.0  MCV 96.7 92.0  MCH 32.7 32.2  MCHC 33.8 35.0  RDW 13.5 13.8  PLT 223 133*   Thyroid  Recent Labs  Lab 12/27/21 1225  TSH 2.625    BNPNo results for input(s): "BNP", "PROBNP" in the last 168 hours.  DDimer No results for input(s): "DDIMER" in the last 168 hours.   Radiology    CARDIAC CATHETERIZATION  Result Date: 12/27/2021   Ramus lesion is 70% stenosed.   1st Mrg lesion is 10% stenosed.   Prox Cx to Mid Cx lesion is 100% stenosed.   Dist Cx lesion is 100% stenosed.   Mid Cx to Dist Cx lesion is 100% stenosed.   Ost LAD to Prox LAD lesion is 100% stenosed.   Mid RCA to Dist RCA lesion is 100% stenosed.   Origin lesion is 100% stenosed.   Origin to Prox Graft lesion is 100% stenosed.   Post intervention, there is a 30% residual stenosis.   There is severe left ventricular systolic dysfunction. Severe native CAD with previously noted total  occlusion of the ostial LAD, 70% stenosis in high marginal/ramus like vessel, 10% stenosis in the obtuse marginal vessel with total occlusion of the mid circumflex involving segmental  with faint collateralization to distal marginal branch.  Native RCA is previously noted to be occluded in its mid segment.  There is faint collateralization to the PDA via the left circulation. Patent LIMA graft supplying the mid LAD. Old ostial occlusion of the SVG which had supplied the circumflex marginal vessel. Ostial occlusion of the SVG  which had supplied the PDA which has multiple stents in place. Severe LV dysfunction with more prominent hypocontractility inferiorly and anterolaterally with EF estimated approximately 30%.  LVEDP was significantly elevated at 40 to 45%.  Patient received IV Lasix and during the procedure had urine output of 1050 cc. Very difficult attempt at PCI of the totally occluded mid circumflex vessel with ultimate PTCA, and cutting balloon within the entire stented segment with reestablishment of antegrade flow down the large distal marginal vessel arising from the distal aspect of the stent.  However, the native circumflex beyond the distal aspect of the stent remained occluded. RECOMMENDATION: Continue DAPT with aspirin/Plavix indefinitely.  Plan 2D echo Doppler study in a.m.  The patient must discontinue tobacco use.  Aggressive lipid-lowering therapy with target LDL less than 70. Plan to optimize medical management.  If recurrent symptomatology consider possibly bringing the patient back to the lab in several days for noncompliant balloon dilatation in the stented segment with questionable attempt at opening of the very distal vessel.   DG Chest Portable 1 View  Result Date: 12/27/2021 CLINICAL DATA:  Chest pain and shortness of breath EXAM: PORTABLE CHEST 1 VIEW COMPARISON:  04/01/2020 FINDINGS: Chronic interstitial coarsening; there is airway thickening and emphysema by CT earlier this year.  No acute infiltrate, pulmonary edema, or pleural effusion. Stable mild cardiomegaly. Prior CABG. Extensive artifact from EKG leads. IMPRESSION: No acute finding when compared to prior. Electronically Signed   By: Jorje Guild M.D.   On: 12/27/2021 05:36    Cardiac Studies   Cath: 12/27/2021    Ramus lesion is 70% stenosed.   1st Mrg lesion is 10% stenosed.   Prox Cx to Mid Cx lesion is 100% stenosed.   Dist Cx lesion  is 100% stenosed.   Mid Cx to Dist Cx lesion is 100% stenosed.   Ost LAD to Prox LAD lesion is 100% stenosed.   Mid RCA to Dist RCA lesion is 100% stenosed.   Origin lesion is 100% stenosed.   Origin to Prox Graft lesion is 100% stenosed.   Post intervention, there is a 30% residual stenosis.   There is severe left ventricular systolic dysfunction.   Severe native CAD with previously noted total occlusion of the ostial LAD, 70% stenosis in high marginal/ramus like vessel, 10% stenosis in the obtuse marginal vessel with total occlusion of the mid circumflex involving segmental  with faint collateralization to distal marginal branch.  Native RCA is previously noted to be occluded in its mid segment.  There is faint collateralization to the PDA via the left circulation.   Patent LIMA graft supplying the mid LAD.   Old ostial occlusion of the SVG which had supplied the circumflex marginal vessel.   Ostial occlusion of the SVG  which had supplied the PDA which has multiple stents in place.   Severe LV dysfunction with more prominent hypocontractility inferiorly and anterolaterally with EF estimated approximately 30%.  LVEDP was significantly elevated at 40 to 45%.  Patient received IV Lasix and during the procedure had urine output of 1050 cc.   Very difficult attempt at PCI of the totally occluded mid circumflex vessel with ultimate PTCA, and cutting balloon within the entire stented segment with reestablishment of antegrade flow down the large distal marginal vessel arising  from the distal aspect of the stent.  However, the native circumflex beyond the distal aspect of the stent remained occluded.   RECOMMENDATION: Continue DAPT with aspirin/Plavix indefinitely.  Plan 2D echo Doppler study in a.m.  The patient must discontinue tobacco use.  Aggressive lipid-lowering therapy with target LDL less than 70. Plan to optimize medical management.  If recurrent symptomatology consider possibly bringing the patient back to the lab in several days for noncompliant balloon dilatation in the stented segment with questionable attempt at opening of the very distal vessel.  Diagnostic:   Dominance: Right      Intervention    Echo: pending  Patient Profile     66 y.o. male CAD s/p CABG (LIMA-LAD, SVG-OM, SVG-PDA) and subsequent PCI, ischemic cardiomyopathy, hypertension, hyperlipidemia, alcohol abuse, ascending aortic aneurysm, restless leg syndrome, depression and COPD with ongoing tobacco smoking who is being seen 12/27/2021 for the evaluation of chest pain and shortness of breath.   Assessment & Plan    Non-STEMI with history of CAD s/p CABG and subsequent PCI's --  presented with symptoms consistent with unstable angina.  This was in the setting of noncompliance with antiplatelet therapy. High-sensitivity troponin 64>>162 EKG with anterior lateral ST/T wave changes with borderline ST elevation in lead II and III. Has baseline RBBB.  -- underwent cardiac cath noted above with patent LIMA-LAD, ostial occlusion of SVG-OM, and occlusion of SVG-PDA with multiple stents noted. Difficult attempt at PCI of occluded mLCx with PTCA/cutting balloon within the stented segment with established flow to large distal marginal. Native Lcx beyond stent remains occluded.  -- plan for DAPT with ASA/plavix indefinitely -- atorvastatin 80mg  daily, Toprol 25mg  daily -- given his events overnight, recheck EKG this morning, resume IV heparin with plans to potential take back for staged intervention,  likely Friday   Ischemic cardiomyopathy Acute hypoxic respiratory failure -- EF was estimated at 30% on LV gram with LVEDP of 40-45%. He was treated with  IV lasix 20mg  post cath -- developed worsening hypoxia overnight, despite duoneb. Sats into the 80s this morning on [place].  -- place on Bipap -- IV lasix 80mg  x1 now, will order 80mg  BID and follow UOP -- continue IV nitro, Toprol 25mg  daily.  -  Will need to optimize GDMT, for aggressively diuresis -- CXR/echo pending Initially stabilized, but apparently has had some more issues throughout the morning.  We will ask PCCM to assist and plan to transfer the patient to the ICU for better monitoring. => Potentially need to consider central line placement check lactate, CoOx/CVP and consider inotropic support with milrinone. We will also discuss with advanced heart failure-May need to consider mechanical support.  CKD II  -- Baseline Scr 1.1-1.4, 1.3 this morning  HLD -- 08/16/2021: Cholesterol, Total 166; HDL 56; LDL Chol Calc (NIH) 88; Triglycerides 127  -- Continue Lipitor and Zetia    COPD with ongoing tobacco smoking -- Diffuse wheezing noted on exam -- continue home nebs -- as above, on Bipap => we will need to assess throughout the day.  Low threshold to transfer to CVICU and ask for PCCM assistance for close monitoring.  HTN -- continue IV nitro, Toprol XL    For questions or updates, please contact [facility] Please consult [email or web address] for contact info under        Signed, , NP  12/28/2021, 8:36 AM     ATTENDING ATTESTATION  I have seen, examined and evaluated the patient this AM on rounds along with , NP-C.  After reviewing all the available data and chart, we discussed the patients laboratory, study & physical findings as well as symptoms in detail.  I agree with her findings, examination as well as impression recommendations as per our discussion.    Attending adjustments noted  in italics.   I reviewed his cath films with Dr. 10/16/2021 yesterday.  He was able to restore flow in the occluded circumflex but not down the distal OM branch.  Vein grafts are closed therefore I chose to intervene on the native vessel that has multiple stents with in-stent restenosis and thrombosis.  Unfortunately, he would not able to complete the intervention with full postdilation due to the patient having agitation and dyspnea.  EDP was severely elevated suggestive of acute combined heart failure with significant diastolic heart failure.  Was treated with IV Lasix with good urine output and then sent to see.  Overnight did okay, but apparently started having some issues earlier this morning.  Upon arrival today, he was having respiratory distress.  Was given nebulizers and Lasix and placed on BiPAP.  Was initially stabilized, but anticipate that he may not tolerate BiPAP long.  We have bumped up his Lasix infusion, continuing IV nitroglycerin.  With concerning EKG abnormalities and known issues with his coronaries, will restart heparin-because of incomplete revascularization.  He will need to have a relook cath with more aggressive angioplasty of ISR in the LCx.  Could also check right heart cath at that time.  He does not have a lot of blood pressure room for afterload reduction beyond IC NTG.  And therefore we are using nitroglycerin.  Once we are able to stabilize him from a pulmonary standpoint, I do think we would like to try to return to the Cath Lab for more complete revascularization.  Concern with him not initially turned the corner with the diuresis and BiPAP earlier this morning is that he may be actually showing signs of  sick having a low output.  Discussed with PCCM.  Plan will be to transfer to CCU, place central line where we can check CoOx, lactate and CVP. => Would anticipate starting milrinone along with IV Lasix but low threshold to consider inotropic support if this is not  adequate. He would not be a very good candidate for intubation therefore will having PCCM on board his vital.  We will also discuss with advanced heart failure.   Marykay Lexavid W. Sebastin Perlmutter, MD, MS Bryan Lemmaavid Noreta Kue, M.D., M.S. Interventional Cardiologist  [facility]  Pager # [phone] Phone # [phone] [address] [place], [place] [id number]

## 2021-12-28 NOTE — Progress Notes (Signed)
Patient transported from 2C17 to room 2H20, on bipap, without complications.

## 2021-12-28 NOTE — Procedures (Signed)
Arterial Catheter Insertion Procedure Note  Kevin Smith  [id number]  17-May-1955  Date:12/28/21  Time:5:05 PM    Provider Performing: Omar Person    Procedure: Insertion of Arterial Line [phone]) with US guidance ([id number])   Indication(s) Blood pressure monitoring and/or need for frequent ABGs  Consent Risks of the procedure as well as the alternatives and risks of each were explained to the patient and/or caregiver.  Consent for the procedure was obtained and is signed in the bedside chart  Anesthesia None   Time Out Verified patient identification, verified procedure, site/side was marked, verified correct patient position, special equipment/implants available, medications/allergies/relevant history reviewed, required imaging and test results available.   Sterile Technique Maximal sterile technique including full sterile barrier drape, hand hygiene, sterile gown, sterile gloves, mask, hair covering, sterile ultrasound probe cover (if used).   Procedure Description Area of catheter insertion was cleaned with chlorhexidine and draped in sterile fashion. With real-time ultrasound guidance an arterial catheter was placed into the left radial artery.  Appropriate arterial tracings confirmed on monitor.     Complications/Tolerance None; patient tolerated the procedure well.   EBL Minimal   Specimen(s) None

## 2021-12-28 NOTE — Progress Notes (Signed)
RT called back to patient room to place patient back on bipap due to patient beginning to have trouble breathing after having a small break from the mask.  Upon arrival, patient was noted to be using accessory muscles, audible expiratory wheezing, and sats of 87% on 15L [place] high flow.  Patient was given a one time Albuterol treatment by Rapid response RN.  Once nebulizer was completed RT was able to convince patient to go back on bipap.  RT had to make adjustments to bipap in order to get patient's sats back up from 86% to 88%.  NP paged.  Will continue to monitor.

## 2021-12-28 NOTE — Progress Notes (Signed)
RT called to patient's room by Rapid Response RN for patient noted to have respiratory distress.  Upon arrival, patient's sats were 88%, had a prolonged expiratory phase, and use of accessory muscles.  Patient was placed on bipap per order.  Patient's sats now at 92% and work of breathing has improved.  ABG to be obtained in an hour.  Will continue to monitor.

## 2021-12-28 NOTE — Progress Notes (Signed)
Mobility Specialist Progress Note    12/28/21 1339  Mobility  Activity Contraindicated/medical hold   RN advised. Will f/u as appropriate.   Hildred Alamin Mobility Specialist  Secure Chat Only

## 2021-12-28 NOTE — Significant Event (Signed)
Rapid Response Event Note   Reason for Call :  Acute respiratory distress  Initial Focused Assessment:  Pt lying in bed, AO. Tachypneic, with expiratory wheeze heard on auscultation. Prolonged expiration with accessory muscle use. Pt denies pain, endorses shortness of breath. Skin is warm, dry, pink. Pulses are 2+ peripherally. No peripheral edema noted.  VS: BP 111/78, HR 82, RR 17, SpO2 92% on BiPAP 50% 14/8  Interventions:  -Albuterol neb -CXR -BiPAP 50% 14/8 -40 mg IV Lasix  Plan of Care:  -ABG in 1 hour 7.49/ 36/ 75/ 27.4 -Strict I&O  Call rapid response for further needs  Event Summary:  MD Notified: Dr. Merryl Hacker, NP Call Time: [phone] Arrival Time: 9357 End Time: 0845   Addendum 0177 12/28/2021: Reason for Call :  Acute respiratory distress.   Initial Focused Assessment:  Pt requesting to come off BiPAP. He had good UOP, unfortunately it was immeasurable as his collection device leaked.   Pt trialed off of BIPAP for 15 minutes. He became very agitated and breathing became labored. He refused to be put back on BiPAP.   Pt assessment similar to above. Expiratory wheeze. Prolonged expiration. Tachypneic. Accessory abdominal muscle use.  VS: T 99.64F, BP 95/71, HR 108, RR 33, SpO2 89% on 100% NRB  Interventions: 2mg  IV Ativan given Albuterol neb given  BiPAP 16/10 60% replaced once patient was calm  125mg  IV Solu-Medrol given  MD Notified: Everitt Amber., NP Call Time: 9390 Arrival Time: 1155 End Time: [place], RN
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
## 2021-12-28 NOTE — Progress Notes (Signed)
CSW attempted to speak with pt about alcohol use, pt asked CSW to leave and stated "he doesn't want to hear anything from  CSW".
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
## 2021-12-28 NOTE — Progress Notes (Signed)
eLink Physician-Brief Progress Note Patient Name: Kevin Smith DOB: 08-30-1955 MRN: [id number]   Date of Service  12/28/2021  HPI/Events of Note  Delirium with significant agitation.  Received 4mg  ativan per CIWA protocol and remains very restless and attempting to get out of bed.  Not leaving oxygen on.  Risk of pulling out catheter and IV access  eICU Interventions  Start precedex infusion      Intervention Category Major Interventions: Delirium, psychosis, severe agitation - evaluation and management  Mauri Brooklyn, P 12/28/2021, 8:20 PM

## 2021-12-29 DIAGNOSIS — I214 Non-ST elevation (NSTEMI) myocardial infarction: Secondary | ICD-10-CM | POA: Diagnosis not present

## 2021-12-29 LAB — CBC
HCT: 42.3 % (ref 39.0–52.0)
Hemoglobin: 14.5 g/dL (ref 13.0–17.0)
MCH: 31.7 pg (ref 26.0–34.0)
MCHC: 34.3 g/dL (ref 30.0–36.0)
MCV: 92.4 fL (ref 80.0–100.0)
Platelets: 237 10*3/uL (ref 150–400)
RBC: 4.58 MIL/uL (ref 4.22–5.81)
RDW: 13.4 % (ref 11.5–15.5)
WBC: 17.2 10*3/uL — ABNORMAL HIGH (ref 4.0–10.5)
nRBC: 0 % (ref 0.0–0.2)

## 2021-12-29 LAB — POCT I-STAT 7, (LYTES, BLD GAS, ICA,H+H)
Acid-Base Excess: 0 mmol/L (ref 0.0–2.0)
Bicarbonate: 24.7 mmol/L (ref 20.0–28.0)
Calcium, Ion: 1.14 mmol/L — ABNORMAL LOW (ref 1.15–1.40)
HCT: 41 % (ref 39.0–52.0)
Hemoglobin: 13.9 g/dL (ref 13.0–17.0)
O2 Saturation: 98 %
Potassium: 4.3 mmol/L (ref 3.5–5.1)
Sodium: 135 mmol/L (ref 135–145)
TCO2: 26 mmol/L (ref 22–32)
pCO2 arterial: 38.5 mmHg (ref 32–48)
pH, Arterial: 7.414 (ref 7.35–7.45)
pO2, Arterial: 103 mmHg (ref 83–108)

## 2021-12-29 LAB — COOXEMETRY PANEL
Carboxyhemoglobin: 1.3 % (ref 0.5–1.5)
Carboxyhemoglobin: 1.7 % — ABNORMAL HIGH (ref 0.5–1.5)
Methemoglobin: <0.7 % (ref 0.0–1.5)
Methemoglobin: <0.7 % (ref 0.0–1.5)
O2 Saturation: 73.8 %
O2 Saturation: 67.9 %
Total hemoglobin: 14.7 g/dL (ref 12.0–16.0)
Total hemoglobin: 13.4 g/dL (ref 12.0–16.0)

## 2021-12-29 LAB — LIPOPROTEIN A (LPA): Lipoprotein (a): 150.4 nmol/L — ABNORMAL HIGH (ref <75.0)

## 2021-12-29 LAB — BASIC METABOLIC PANEL
Anion gap: 14 (ref 5–15)
BUN: 25 mg/dL — ABNORMAL HIGH (ref 8–23)
CO2: 23 mmol/L (ref 22–32)
Calcium: 9.2 mg/dL (ref 8.9–10.3)
Chloride: 99 mmol/L (ref 98–111)
Creatinine, Ser: 1.81 mg/dL — ABNORMAL HIGH (ref 0.61–1.24)
GFR, Estimated: 41 mL/min — ABNORMAL LOW (ref >60)
Glucose, Bld: 177 mg/dL — ABNORMAL HIGH (ref 70–99)
Potassium: 4 mmol/L (ref 3.5–5.1)
Sodium: 136 mmol/L (ref 135–145)

## 2021-12-29 LAB — HEPARIN LEVEL (UNFRACTIONATED)
Heparin Unfractionated: <0.1 IU/mL — ABNORMAL LOW (ref 0.30–0.70)
Heparin Unfractionated: 0.24 IU/mL — ABNORMAL LOW (ref 0.30–0.70)

## 2021-12-29 LAB — BRAIN NATRIURETIC PEPTIDE: B Natriuretic Peptide: 226.7 pg/mL — ABNORMAL HIGH (ref 0.0–100.0)

## 2021-12-29 MED ORDER — GUAIFENESIN 100 MG/5ML PO LIQD
5.0000 mL | ORAL | Status: DC | PRN
  Administered 2021-12-29 – 2022-01-01 (×7): 5 mL via ORAL
  Filled 2021-12-29 (×2): qty 10
  Filled 2021-12-29: qty 5
  Filled 2021-12-29 (×3): qty 10
  Filled 2021-12-29: qty 5

## 2021-12-29 MED ORDER — HEPARIN BOLUS VIA INFUSION
2000.0000 [IU] | Freq: Once | INTRAVENOUS | Status: AC
  Administered 2021-12-29: 2000 [IU] via INTRAVENOUS
  Filled 2021-12-29: qty 2000

## 2021-12-29 MED ORDER — AEROCHAMBER PLUS FLO-VU MEDIUM MISC
1.0000 | Freq: Once | Status: DC
  Filled 2021-12-29: qty 1

## 2021-12-29 MED ORDER — FUROSEMIDE 10 MG/ML IJ SOLN
40.0000 mg | Freq: Two times a day (BID) | INTRAMUSCULAR | Status: DC
  Filled 2021-12-29: qty 4

## 2021-12-29 MED ORDER — UMECLIDINIUM BROMIDE 62.5 MCG/ACT IN AEPB
1.0000 | INHALATION_SPRAY | Freq: Every day | RESPIRATORY_TRACT | Status: DC
  Filled 2021-12-29: qty 7

## 2021-12-29 MED ORDER — PHENOBARBITAL SODIUM 130 MG/ML IJ SOLN
65.0000 mg | Freq: Once | INTRAMUSCULAR | Status: AC
  Administered 2021-12-29: 65 mg via INTRAVENOUS
  Filled 2021-12-29: qty 1

## 2021-12-29 MED ORDER — SODIUM CHLORIDE 0.9 % IV SOLN: INTRAVENOUS | Status: AC

## 2021-12-29 MED ORDER — UMECLIDINIUM BROMIDE 62.5 MCG/ACT IN AEPB
1.0000 | INHALATION_SPRAY | Freq: Every day | RESPIRATORY_TRACT | Status: DC
  Administered 2021-12-30 – 2022-01-02 (×4): 1 via RESPIRATORY_TRACT
  Filled 2021-12-29 (×2): qty 7

## 2021-12-29 MED ORDER — ALBUTEROL SULFATE HFA 108 (90 BASE) MCG/ACT IN AERS
2.0000 | INHALATION_SPRAY | RESPIRATORY_TRACT | Status: DC | PRN
  Administered 2021-12-31 – 2022-01-01 (×2): 2 via RESPIRATORY_TRACT

## 2021-12-29 MED ORDER — BUDESONIDE 0.25 MG/2ML IN SUSP
0.2500 mg | Freq: Two times a day (BID) | RESPIRATORY_TRACT | Status: DC
  Administered 2021-12-30 – 2022-01-01 (×5): 0.25 mg via RESPIRATORY_TRACT
  Filled 2021-12-29 (×5): qty 2

## 2021-12-29 NOTE — Progress Notes (Addendum)
Advanced Heart Failure Rounding Note  PCP-Cardiologist: Dr. Dominga Ferry   Subjective:    Agitation and delirium overnight. Started on Precedex. Off BiPAP. On 6L [place].  This morning, on Milrinone 0.25 + NE 1  Co-ox 74%. MAPs 80s   1.7L in UOP yesterday. CVP 3  WBC 8.5>>17.2K. AF. On empiric rocephin. PCT < 0.10. LA 1.2     Objective:   Weight Range: 78.1 kg Body mass index is 24.01 kg/m.   Vital Signs:   Temp:  [99.6 F (37.6 C)] 99.6 F (37.6 C) (10/18 1232) Pulse Rate:  [58-122] 58 (10/19 0730) Resp:  [15-39] 22 (10/19 0730) BP: (90-141)/(65-86) 90/67 (10/19 0700) SpO2:  [80 %-100 %] 98 % (10/19 0730) Arterial Line BP: (77-173)/(36-126) 148/81 (10/19 0730) FiO2 (%):  [50 %-100 %] 100 % (10/18 2041) Last BM Date : 12/25/21  Weight change: Filed Weights   12/27/21 0456 12/27/21 1937  Weight: 104.3 kg 78.1 kg    Intake/Output:   Intake/Output Summary (Last 24 hours) at 12/29/2021 0804 Last data filed at 12/29/2021 0600 Gross per 24 hour  Intake 904.46 ml  Output 1690 ml  Net -785.54 ml      Physical Exam    CVP 3 General:  sleeping. No distress HEENT: Normal Neck: Supple. JVP not elevated . Carotids 2+ bilat; no bruits. No lymphadenopathy or thyromegaly appreciated. Cor: PMI nondisplaced. Regular rate & rhythm. No rubs, gallops or murmurs. Lungs: Clear Abdomen: Soft, nontender, nondistended. No hepatosplenomegaly. No bruits or masses. Good bowel sounds. Extremities: No cyanosis, clubbing, rash, trace b/l LLE edema Neuro: Alert & orientedx3, cranial nerves grossly intact. moves all 4 extremities w/o difficulty. Affect pleasant   Telemetry   NSR 60s   EKG    N/A   Labs    CBC Recent Labs    12/28/21 0015 12/29/21 0429 12/29/21 0434  WBC 8.5  --  17.2*  HGB 14.0 13.9 14.5  HCT 40.0 41.0 42.3  MCV 92.0  --  92.4  PLT 133*  --  237   Basic Metabolic Panel Recent Labs    86/76/72 0015 12/29/21 0429 12/29/21 0434  NA 139 135 136   K 3.5 4.3 4.0  CL 105  --  99  CO2 25  --  23  GLUCOSE 110*  --  177*  BUN 14  --  25*  CREATININE 1.37*  --  1.81*  CALCIUM 8.6*  --  9.2   Liver Function Tests Recent Labs    12/28/21 2112  AST 93*  ALT 34  ALKPHOS 58  BILITOT 0.8  PROT 6.9  ALBUMIN 3.6   No results for input(s): "LIPASE", "AMYLASE" in the last 72 hours. Cardiac Enzymes No results for input(s): "CKTOTAL", "CKMB", "CKMBINDEX", "TROPONINI" in the last 72 hours.  BNP: BNP (last 3 results) No results for input(s): "BNP" in the last 8760 hours.  ProBNP (last 3 results) No results for input(s): "PROBNP" in the last 8760 hours.   D-Dimer No results for input(s): "DDIMER" in the last 72 hours. Hemoglobin A1C Recent Labs    12/27/21 1225  HGBA1C 5.5   Fasting Lipid Panel Recent Labs    12/28/21 0015  CHOL 128  HDL 43  LDLCALC 54  TRIG 157*  CHOLHDL 3.0   Thyroid Function Tests Recent Labs    12/27/21 1225  TSH 2.625    Other results:   Imaging    DG CHEST PORT 1 VIEW  Result Date: 12/28/2021 CLINICAL DATA:  Right  central line placement. EXAM: PORTABLE CHEST 1 VIEW COMPARISON:  Chest radiograph earlier today. FINDINGS: New right internal jugular central venous catheter tip overlies the upper SVC. No pneumothorax. Costophrenic angles are not entirely included in the field of view. Prior median sternotomy. Stable cardiomegaly. Unchanged mediastinal contours. Underlying chronic lung disease with chronic bronchial thickening and areas of parenchymal scarring. No pleural effusion or acute airspace disease. Stable osseous structures. IMPRESSION: 1. New right internal jugular central venous catheter with tip overlying the upper SVC. No pneumothorax. 2. Stable cardiomegaly post CABG and chronic lung disease. Electronically Signed   By: Keith Rake M.D.   On: 12/28/2021 16:26   ECHOCARDIOGRAM COMPLETE  Result Date: 12/28/2021    ECHOCARDIOGRAM REPORT   Patient Name:   Kevin Smith Date of  Exam: 12/28/2021 Medical Rec #:  [id number]     Height:       71.0 in Accession #:    [id number]    Weight:       172.2 lb Date of Birth:  Mar 23, 1955      BSA:          1.979 m Patient Age:    66 years      BP:           95/71 mmHg Patient Gender: M             HR:           81 bpm. Exam Location:  Inpatient Procedure: 2D Echo, Color Doppler and Cardiac Doppler Indications:    R07.9* Chest pain, unspecified  History:        Patient has prior history of Echocardiogram examinations, most                 recent 04/01/2020. Prior CABG, COPD; Risk Factors:Hypertension,                 Dyslipidemia and ETOH.  Sonographer:    Raquel Sarna Senior RDCS Referring Phys: [id number] [place]  1. Left ventricular ejection fraction, by estimation, is 30 to 35%. The left ventricle has moderately decreased function. The left ventricle demonstrates regional wall motion abnormalities with basal to mid inferoseptal, basal to mid inferior, and basal  to mid inferolateral akinesis. The left ventricular internal cavity size was moderately dilated. Left ventricular diastolic parameters are consistent with Grade I diastolic dysfunction (impaired relaxation).  2. Right ventricular systolic function is mildly reduced. The right ventricular size is normal. Tricuspid regurgitation signal is inadequate for assessing PA pressure.  3. Right atrial size was mildly dilated.  4. The mitral valve is normal in structure. No evidence of mitral valve regurgitation. No evidence of mitral stenosis.  5. The aortic valve is tricuspid. There is mild calcification of the aortic valve. Aortic valve regurgitation is mild. No aortic stenosis is present.  6. Aortic dilatation noted. There is severe dilatation of the ascending aorta, measuring 49 mm.  7. The inferior vena cava is normal in size with <50% respiratory variability, suggesting right atrial pressure of 8 mmHg.  8. Technically difficult study with poor images. FINDINGS  Left Ventricle: Left  ventricular ejection fraction, by estimation, is 30 to 35%. The left ventricle has moderately decreased function. The left ventricle demonstrates regional wall motion abnormalities. The left ventricular internal cavity size was moderately dilated. There is no left ventricular hypertrophy. Left ventricular diastolic parameters are consistent with Grade I diastolic dysfunction (impaired relaxation). Right Ventricle: The right ventricular size is normal. Right vetricular wall thickness was not  well visualized. Right ventricular systolic function is mildly reduced. Tricuspid regurgitation signal is inadequate for assessing PA pressure. Left Atrium: Left atrial size was normal in size. Right Atrium: Right atrial size was mildly dilated. Pericardium: Trivial pericardial effusion is present. Mitral Valve: The mitral valve is normal in structure. No evidence of mitral valve regurgitation. No evidence of mitral valve stenosis. Tricuspid Valve: The tricuspid valve is normal in structure. Tricuspid valve regurgitation is trivial. Aortic Valve: The aortic valve is tricuspid. There is mild calcification of the aortic valve. Aortic valve regurgitation is mild. No aortic stenosis is present. Pulmonic Valve: The pulmonic valve was normal in structure. Pulmonic valve regurgitation is not visualized. Aorta: Aortic dilatation noted. There is severe dilatation of the ascending aorta, measuring 49 mm. Venous: The inferior vena cava is normal in size with less than 50% respiratory variability, suggesting right atrial pressure of 8 mmHg. IAS/Shunts: No atrial level shunt detected by color flow Doppler.  LEFT VENTRICLE PLAX 2D LVIDd:         6.80 cm   Diastology LVIDs:         5.50 cm   LV e' medial:    6.85 cm/s LV PW:         1.00 cm   LV E/e' medial:  7.6 LV IVS:        0.80 cm   LV e' lateral:   7.51 cm/s LVOT diam:     2.30 cm   LV E/e' lateral: 6.9 LV SV:         75 LV SV Index:   38 LVOT Area:     4.15 cm  RIGHT VENTRICLE RV S  prime:     8.59 cm/s TAPSE (M-mode): 0.6 cm LEFT ATRIUM             Index        RIGHT ATRIUM           Index LA diam:        3.50 cm 1.77 cm/m   RA Area:     21.70 cm LA Vol (A2C):   60.2 ml 30.43 ml/m  RA Volume:   60.40 ml  30.53 ml/m LA Vol (A4C):   27.7 ml 14.00 ml/m LA Biplane Vol: 44.2 ml 22.34 ml/m  AORTIC VALVE LVOT Vmax:   111.00 cm/s LVOT Vmean:  81.000 cm/s LVOT VTI:    0.181 m  AORTA Ao Root diam: 4.20 cm Ao Asc diam:  4.90 cm MITRAL VALVE MV Area (PHT): 2.61 cm    SHUNTS MV Decel Time: 291 msec    Systemic VTI:  0.18 m MV E velocity: 52.00 cm/s  Systemic Diam: 2.30 cm MV A velocity: 69.80 cm/s MV E/A ratio:  0.74 Dalton McleanMD Electronically signed by Franki Monte Signature Date/Time: 12/28/2021/4:20:42 PM    Final    DG Chest Port 1 View  Result Date: 12/28/2021 CLINICAL DATA:  Acute respiratory distress.  Shortness of breath. EXAM: PORTABLE CHEST 1 VIEW COMPARISON:  Radiographs 12/27/2021 and 04/01/2020.  CT 08/23/2021. FINDINGS: 0841 hours. The heart size and mediastinal contours are stable status post median sternotomy and CABG. Stable chronic lung disease with paraseptal emphysema and scattered parenchymal scarring. No superimposed edema, airspace disease, pneumothorax or significant pleural effusion identified. The right costophrenic angle is incompletely visualized. Old rib fractures are present bilaterally. Telemetry leads overlie the chest. IMPRESSION: Stable chronic lung disease. No evidence of acute cardiopulmonary process. Electronically Signed   By: Richardean Sale M.D.   On:  12/28/2021 08:49     Medications:     Scheduled Medications:  aspirin  81 mg Oral Daily   atorvastatin  80 mg Oral Daily   Chlorhexidine Gluconate Cloth  6 each Topical Daily   clopidogrel  75 mg Oral Q breakfast   ezetimibe  10 mg Oral Daily   famotidine  20 mg Oral Daily   folic acid  1 mg Oral Daily   furosemide  40 mg Intravenous Once   furosemide  40 mg Intravenous BID    multivitamin with minerals  1 tablet Oral Daily   nicotine  21 mg Transdermal Daily   pantoprazole  40 mg Oral Daily   rOPINIRole  2 mg Oral QHS   sodium chloride flush  10-40 mL Intracatheter Q12H   sodium chloride flush  3 mL Intravenous Q12H   spironolactone  25 mg Oral Daily   thiamine  100 mg Oral Daily   Or   thiamine  100 mg Intravenous Daily    Infusions:  sodium chloride 10 mL/hr at 12/29/21 0600   sodium chloride 10 mL/hr at 12/29/21 0600   sodium chloride 10 mL/hr at 12/29/21 0600   cefTRIAXone (ROCEPHIN)  IV Stopped (12/28/21 1747)   dexmedetomidine (PRECEDEX) IV infusion 0.6 mcg/kg/hr (12/29/21 0600)   heparin     milrinone 0.25 mcg/kg/min (12/29/21 [id number])   nitroGLYCERIN Stopped (12/28/21 1600)   norepinephrine (LEVOPHED) Adult infusion 1 mcg/min (12/29/21 0600)    PRN Medications: sodium chloride, sodium chloride, sodium chloride, acetaminophen, albuterol, LORazepam **OR** LORazepam, nitroGLYCERIN, ondansetron (ZOFRAN) IV, sodium chloride flush, zolpidem    Patient Profile   66 y/o male w/ history of chronic systolic heart failure due to ICM, CAD s/p CABG in 2014 w/ subsequent recurrent MIs and stenting, HTN, HLD, tobacco abuse and ETOH abuse, admitted w/ acute STEMI c/b a/c CHF and hypoxic respiratory failure.   Assessment/Plan   1. CAD/ Acute STEMI  - s/p CABG in 2014 and subsequent PCI's - culprit lesion totally occluded mid-distal Lcx, incomplete PTCA (failed attempt) s/p PTCA/cutting balloon within the stented segment,  Native Lcx beyond stent remains occluded - continue medical management, heparin gtt, ASA+Plavix, high intensity statin  - plan re-look cath/re-attempt PCI of dLCx on Friday  - if recurrent CP, consider IABP to help w/ coronary perfusion    2. Acute on Chronic Systolic Heart Failure - ICM - Echo 1/22 EF 40%, RV mildly reduced - EF now 30-35%, RV mildly reduced post STEMI. No MR. LVEDP severely dilated on cath  - on Milrinone 0.25 + NE 1.  Co-ox 78%, MAP 80s - Stop NE. Reduced Milrinone to 0.125. Follow co-ox  - CVP 3. Hold lasix  - initiation of oral GDMT once BP stable and renal fx improves    3. Acute Hypoxic Respiratory Failure - now off bipap  - diuresis per above - PCCM following, treating empirically for PNA w/ Rocephin.     4. AKI  - Scr 1.49>>1.37>>1.81 - suspect ATN +/- CIN  - continue to support CO w/ milrinone  - avoid hypotension  - hold diuretics today w/ low CVP - follow BMP    5. Tobacco Abuse - smoking cessation imperative   6. ETOH Abuse  - + withdrawal symptoms - CIWA protocol   Length of Stay: 2  Lyda Jester, PA-C  12/29/2021, 8:04 AM  Advanced Heart Failure Team Pager [phone] (M-F; 7a - 5p)  Please contact [facility] for night-coverage after hours (5p -7a ) and weekends on [email or web address]
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
## 2021-12-29 NOTE — Progress Notes (Signed)
Patient has had bouts of hyperactive delirium last night and removing the BiPAP. Patient saturations immediately dropped upon removal of the mask. Patient has poor O2 reserve. 0500 ABG was drawn on the NIV machine on current Settings in the flowsheet.   Kevin Smith, BS, RRT-ACCS, RCP
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
## 2021-12-29 NOTE — Social Work (Signed)
CSW acknowledges consult for substance use and resources. TOC will continue to monitor for when pt is appropriate. Please consult TOC for any further needs 
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
## 2021-12-30 ENCOUNTER — Other Ambulatory Visit (HOSPITAL_COMMUNITY): Payer: Self-pay

## 2021-12-30 ENCOUNTER — Telehealth (HOSPITAL_COMMUNITY): Payer: Self-pay

## 2021-12-30 ENCOUNTER — Encounter (HOSPITAL_COMMUNITY)
Admission: EM | Disposition: A | Discharge status: Home Health | Payer: Self-pay | Source: Home / Self Care | Attending: Cardiology

## 2021-12-30 DIAGNOSIS — I214 Non-ST elevation (NSTEMI) myocardial infarction: Secondary | ICD-10-CM | POA: Diagnosis not present

## 2021-12-30 LAB — MAGNESIUM: Magnesium: 1.6 mg/dL — ABNORMAL LOW (ref 1.7–2.4)

## 2021-12-30 LAB — CBC
HCT: 37.6 % — ABNORMAL LOW (ref 39.0–52.0)
Hemoglobin: 13 g/dL (ref 13.0–17.0)
MCH: 32.3 pg (ref 26.0–34.0)
MCHC: 34.6 g/dL (ref 30.0–36.0)
MCV: 93.3 fL (ref 80.0–100.0)
Platelets: 199 10*3/uL (ref 150–400)
RBC: 4.03 MIL/uL — ABNORMAL LOW (ref 4.22–5.81)
RDW: 13.5 % (ref 11.5–15.5)
WBC: 15.6 10*3/uL — ABNORMAL HIGH (ref 4.0–10.5)
nRBC: 0 % (ref 0.0–0.2)

## 2021-12-30 LAB — BASIC METABOLIC PANEL
Anion gap: 13 (ref 5–15)
BUN: 33 mg/dL — ABNORMAL HIGH (ref 8–23)
CO2: 24 mmol/L (ref 22–32)
Calcium: 8.7 mg/dL — ABNORMAL LOW (ref 8.9–10.3)
Chloride: 100 mmol/L (ref 98–111)
Creatinine, Ser: 1.62 mg/dL — ABNORMAL HIGH (ref 0.61–1.24)
GFR, Estimated: 47 mL/min — ABNORMAL LOW (ref >60)
Glucose, Bld: 95 mg/dL (ref 70–99)
Potassium: 3.6 mmol/L (ref 3.5–5.1)
Sodium: 137 mmol/L (ref 135–145)

## 2021-12-30 LAB — COOXEMETRY PANEL
Carboxyhemoglobin: 1.6 % — ABNORMAL HIGH (ref 0.5–1.5)
Methemoglobin: <0.7 % (ref 0.0–1.5)
O2 Saturation: 80.8 %
Total hemoglobin: 13.3 g/dL (ref 12.0–16.0)

## 2021-12-30 LAB — HEPARIN LEVEL (UNFRACTIONATED): Heparin Unfractionated: 0.44 IU/mL (ref 0.30–0.70)

## 2021-12-30 SURGERY — LEFT HEART CATH AND CORS/GRAFTS ANGIOGRAPHY
Anesthesia: LOCAL

## 2021-12-30 MED ORDER — ALBUTEROL SULFATE (2.5 MG/3ML) 0.083% IN NEBU
INHALATION_SOLUTION | RESPIRATORY_TRACT | Status: AC
  Administered 2021-12-30: 2.5 mg via RESPIRATORY_TRACT
  Filled 2021-12-30: qty 3

## 2021-12-30 MED ORDER — POTASSIUM CHLORIDE CRYS ER 20 MEQ PO TBCR
40.0000 meq | EXTENDED_RELEASE_TABLET | Freq: Once | ORAL | Status: AC
  Administered 2021-12-30: 40 meq via ORAL
  Filled 2021-12-30: qty 2

## 2021-12-30 MED ORDER — THIAMINE HCL 100 MG/ML IJ SOLN: 100.0000 mg | Freq: Once | INTRAMUSCULAR | Status: DC

## 2021-12-30 MED ORDER — ONDANSETRON 4 MG PO TBDP: 4.0000 mg | ORAL_TABLET | Freq: Four times a day (QID) | ORAL | Status: AC | PRN

## 2021-12-30 MED ORDER — SODIUM CHLORIDE 0.9 % IV SOLN: 250.0000 mL | INTRAVENOUS | Status: DC | PRN

## 2021-12-30 MED ORDER — CHLORDIAZEPOXIDE HCL 5 MG PO CAPS: 25.0000 mg | ORAL_CAPSULE | Freq: Every day | ORAL | Status: DC

## 2021-12-30 MED ORDER — ADULT MULTIVITAMIN W/MINERALS CH
1.0000 | ORAL_TABLET | Freq: Every day | ORAL | Status: DC
  Administered 2021-12-30 – 2022-01-02 (×4): 1 via ORAL
  Filled 2021-12-30 (×4): qty 1

## 2021-12-30 MED ORDER — METOPROLOL SUCCINATE ER 25 MG PO TB24
12.5000 mg | ORAL_TABLET | Freq: Every day | ORAL | Status: DC
  Administered 2021-12-30 – 2022-01-01 (×3): 12.5 mg via ORAL
  Filled 2021-12-30 (×3): qty 1

## 2021-12-30 MED ORDER — CHLORDIAZEPOXIDE HCL 5 MG PO CAPS
25.0000 mg | ORAL_CAPSULE | Freq: Three times a day (TID) | ORAL | Status: AC
  Administered 2021-12-31 (×2): 25 mg via ORAL
  Filled 2021-12-30 (×2): qty 5

## 2021-12-30 MED ORDER — SODIUM CHLORIDE 0.9% FLUSH
3.0000 mL | Freq: Two times a day (BID) | INTRAVENOUS | Status: DC
  Administered 2021-12-30: 3 mL via INTRAVENOUS

## 2021-12-30 MED ORDER — CHLORDIAZEPOXIDE HCL 5 MG PO CAPS
25.0000 mg | ORAL_CAPSULE | ORAL | Status: AC
  Administered 2022-01-01 – 2022-01-02 (×2): 25 mg via ORAL
  Filled 2021-12-30 (×2): qty 5

## 2021-12-30 MED ORDER — CHLORDIAZEPOXIDE HCL 5 MG PO CAPS
25.0000 mg | ORAL_CAPSULE | Freq: Four times a day (QID) | ORAL | Status: AC
  Administered 2021-12-30 – 2021-12-31 (×4): 25 mg via ORAL
  Filled 2021-12-30 (×3): qty 1
  Filled 2021-12-30: qty 5

## 2021-12-30 MED ORDER — CHLORDIAZEPOXIDE HCL 25 MG PO CAPS
50.0000 mg | ORAL_CAPSULE | Freq: Once | ORAL | Status: AC
  Administered 2021-12-30: 50 mg via ORAL
  Filled 2021-12-30: qty 2

## 2021-12-30 MED ORDER — HYDROXYZINE HCL 25 MG PO TABS
25.0000 mg | ORAL_TABLET | Freq: Four times a day (QID) | ORAL | Status: AC | PRN
  Administered 2021-12-30 – 2022-01-02 (×4): 25 mg via ORAL
  Filled 2021-12-30 (×4): qty 1

## 2021-12-30 MED ORDER — MAGNESIUM SULFATE 4 GM/100ML IV SOLN
4.0000 g | Freq: Once | INTRAVENOUS | Status: AC
  Administered 2021-12-30: 4 g via INTRAVENOUS
  Filled 2021-12-30: qty 100

## 2021-12-30 MED ORDER — CHLORDIAZEPOXIDE HCL 5 MG PO CAPS: 25.0000 mg | ORAL_CAPSULE | Freq: Four times a day (QID) | ORAL | Status: AC | PRN

## 2021-12-30 MED ORDER — SODIUM CHLORIDE 0.9% FLUSH
3.0000 mL | INTRAVENOUS | Status: DC | PRN
  Administered 2021-12-30: 3 mL via INTRAVENOUS

## 2021-12-30 MED ORDER — LOPERAMIDE HCL 2 MG PO CAPS: 2.0000 mg | ORAL_CAPSULE | ORAL | Status: DC | PRN

## 2021-12-30 MED ORDER — SODIUM CHLORIDE 0.9 % IV SOLN: INTRAVENOUS | Status: DC

## 2021-12-30 MED ORDER — ENOXAPARIN SODIUM 40 MG/0.4ML IJ SOSY
40.0000 mg | PREFILLED_SYRINGE | INTRAMUSCULAR | Status: DC
  Administered 2021-12-30 – 2022-01-01 (×3): 40 mg via SUBCUTANEOUS
  Filled 2021-12-30 (×3): qty 0.4

## 2021-12-30 NOTE — Telephone Encounter (Signed)
Pharmacy Patient Advocate Encounter  Insurance verification completed.    The patient is insured through [facility]   The patient is currently admitted and ran test claims for the following: Jardiance, Farxiga.  Copays and coinsurance results were relayed to Inpatient clinical team.
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
## 2021-12-30 NOTE — Progress Notes (Signed)
Arrived to patient's room. Patient up in chair. Notified nurse to re-consult IV team when patient is back to bed. VU. Fran Lowes, RN VAST

## 2021-12-30 NOTE — Progress Notes (Addendum)
Advanced Heart Failure Rounding Note  PCP-Cardiologist: Dr. Zandra Abts   Subjective:     CO-OX 80% on milrinone 0.125 mcg.     Denies chest pain. Denies SOB.   .  Objective:   Weight Range: 105.7 kg Body mass index is 32.5 kg/m.   Vital Signs:   Temp:  [96.2 F (35.7 C)-98.6 F (37 C)] 98.6 F (37 C) (10/20 0400) Pulse Rate:  [58-107] 83 (10/20 0600) Resp:  [15-31] 26 (10/20 0600) BP: (92-127)/(59-110) 94/60 (10/20 0500) SpO2:  [74 %-98 %] 96 % (10/20 0600) Arterial Line BP: (86-148)/(51-81) 97/55 (10/20 0600) FiO2 (%):  [60 %] 60 % (10/19 0800) Weight:  [105.7 kg] 105.7 kg (10/20 0500) Last BM Date : 12/25/21  Weight change: Filed Weights   12/27/21 0456 12/27/21 1937 12/30/21 0500  Weight: 104.3 kg 78.1 kg 105.7 kg    Intake/Output:   Intake/Output Summary (Last 24 hours) at 12/30/2021 0714 Last data filed at 12/30/2021 0600 Gross per 24 hour  Intake 1291.82 ml  Output 1585 ml  Net -293.18 ml    CVP 4   Physical Exam    General:   No resp difficulty HEENT: normal Neck: supple. no JVD. Carotids 2+ bilat; no bruits. No lymphadenopathy or thryomegaly appreciated. RIJ  Cor: PMI nondisplaced. Regular rate & rhythm. No rubs, gallops or murmurs. Lungs: Decreased in the bases on 4 liters Stout. Abdomen: soft, nontender, nondistended. No hepatosplenomegaly. No bruits or masses. Good bowel sounds. Extremities: no cyanosis, clubbing, rash, edema. LUE art line Neuro: alert & orientedx3, cranial nerves grossly intact. moves all 4 extremities w/o difficulty. Affect pleasant   Telemetry  SR 80-90s   EKG    N/A   Labs    CBC Recent Labs    12/29/21 0434 12/30/21 0331  WBC 17.2* 15.6*  HGB 14.5 13.0  HCT 42.3 37.6*  MCV 92.4 93.3  PLT 237 [id number]   Basic Metabolic Panel Recent Labs    12/29/21 0434 12/30/21 0331  NA 136 137  K 4.0 3.6  CL 99 100  CO2 23 24  GLUCOSE 177* 95  BUN 25* 33*  CREATININE 1.81* 1.62*  CALCIUM 9.2 8.7*  MG  --   1.6*   Liver Function Tests Recent Labs    12/28/21 2112  AST 93*  ALT 34  ALKPHOS 58  BILITOT 0.8  PROT 6.9  ALBUMIN 3.6   No results for input(s): "LIPASE", "AMYLASE" in the last 72 hours. Cardiac Enzymes No results for input(s): "CKTOTAL", "CKMB", "CKMBINDEX", "TROPONINI" in the last 72 hours.  BNP: BNP (last 3 results) Recent Labs    12/29/21 0915  BNP 226.7*    ProBNP (last 3 results) No results for input(s): "PROBNP" in the last 8760 hours.   D-Dimer No results for input(s): "DDIMER" in the last 72 hours. Hemoglobin A1C Recent Labs    12/27/21 1225  HGBA1C 5.5   Fasting Lipid Panel Recent Labs    12/28/21 0015  CHOL 128  HDL 43  LDLCALC 54  TRIG 157*  CHOLHDL 3.0   Thyroid Function Tests Recent Labs    12/27/21 1225  TSH 2.625    Other results:   Imaging    No results found.   Medications:     Scheduled Medications:  AeroChamber Plus Flo-Vu Medium  1 each Other Once   aspirin  81 mg Oral Daily   atorvastatin  80 mg Oral Daily   budesonide (PULMICORT) nebulizer solution  0.25 mg Nebulization BID  Chlorhexidine Gluconate Cloth  6 each Topical Daily   clopidogrel  75 mg Oral Q breakfast   ezetimibe  10 mg Oral Daily   famotidine  20 mg Oral Daily   folic acid  1 mg Oral Daily   multivitamin with minerals  1 tablet Oral Daily   nicotine  21 mg Transdermal Daily   pantoprazole  40 mg Oral Daily   rOPINIRole  2 mg Oral QHS   sodium chloride flush  10-40 mL Intracatheter Q12H   sodium chloride flush  3 mL Intravenous Q12H   sodium chloride flush  3 mL Intravenous Q12H   spironolactone  25 mg Oral Daily   thiamine  100 mg Oral Daily   Or   thiamine  100 mg Intravenous Daily   umeclidinium bromide  1 puff Inhalation Daily    Infusions:  sodium chloride Stopped (12/29/21 0842)   sodium chloride Stopped (12/29/21 1041)   sodium chloride Stopped (12/29/21 0838)   sodium chloride     sodium chloride 50 mL/hr at 12/30/21 0600    dexmedetomidine (PRECEDEX) IV infusion Stopped (12/29/21 1219)   heparin 1,800 Units/hr (12/30/21 0600)   magnesium sulfate bolus IVPB 50 mL/hr at 12/30/21 0600   milrinone 0.125 mcg/kg/min (12/30/21 0600)   nitroGLYCERIN Stopped (12/28/21 1600)   norepinephrine (LEVOPHED) Adult infusion Stopped (12/29/21 0749)    PRN Medications: sodium chloride, sodium chloride, sodium chloride, sodium chloride, acetaminophen, albuterol, guaiFENesin, LORazepam **OR** LORazepam, nitroGLYCERIN, ondansetron (ZOFRAN) IV, sodium chloride flush, sodium chloride flush, zolpidem    Patient Profile   66 y/o male w/ history of chronic systolic heart failure due to ICM, CAD s/p CABG in 2014 w/ subsequent recurrent MIs and stenting, HTN, HLD, tobacco abuse and ETOH abuse, admitted w/ acute STEMI c/b a/c CHF and hypoxic respiratory failure.   Assessment/Plan   1. CAD/ Acute STEMI  - s/p CABG in 2014 and subsequent PCI's - culprit lesion totally occluded mid-distal Lcx, incomplete PTCA (failed attempt) s/p PTCA/cutting balloon within the stented segment,  Native Lcx beyond stent remains occluded - continue medical management, heparin gtt, ASA+Plavix, high intensity statin  - plan re-look cath/re-attempt PCI of dLCx  today. NPO. Getting IV fluids.  - No chest pain.  - if recurrent CP, consider IABP to help w/ coronary perfusion    2. Acute on Chronic Systolic Heart Failure - ICM - Echo 1/22 EF 40%, RV mildly reduced - EF now 30-35%, RV mildly reduced post STEMI. No MR. LVEDP severely dilated on cath  - CO-OX 80% on milrinone 0.125 mcg. Continue for now.  - CVP 4. No diuretics.  - initiation of oral GDMT once BP stable and renal fx improves    3. Acute Hypoxic Respiratory Failure - Sats stable on nasal cannula.  - PCCM following, treating empirically for PNA w/ Rocephin.     4. AKI  - Scr 1.49>>1.37>>1.81>>1.6. Pre cath getting IV fluids.  - suspect ATN +/- CIN  - continue to support CO w/ milrinone  -  avoid hypotension    5. Tobacco Abuse - smoking cessation imperative   6. ETOH Abuse  - + withdrawal symptoms - CIWA protocol   Multidisciplinary discussion with Dr Daniel Nones and interventionsl team. No plan for intervention at this time given ETOH abuse.   Stop IV fluids. Stop heparin and milrinone. Optimize GDMT .   Length of Stay: 3  Darrick Grinder, NP  12/30/2021, 7:14 AM  Advanced Heart Failure Team Pager [phone] (M-F; 7a - 5p)  Please  contact [facility] for night-coverage after hours (5p -7a ) and weekends on [email or web address]

## 2021-12-30 NOTE — Progress Notes (Signed)
   Off milrinone. No chest pain. CVP remains low. Stable for transfer.  Add 12.5 mg Toprol XL daily.  Consider jardiance tomorrow.  CO-Pay for jardiance $45.00 versus Farxiga CO-Pay $90.00    Transfer to [place] NP-C  3:18 PM

## 2021-12-30 NOTE — TOC Benefit Eligibility Note (Signed)
Patient Teacher, English as a foreign language completed.    The patient is currently admitted and upon discharge could be taking Jardiance 10 mg.  The current 30 day co-pay is $45.00.   The patient is currently admitted and upon discharge could be taking Farxiga 10 mg.  The current 30 day co-pay is $95.00.   The patient is insured through [facility] [place] Patient Advocate Specialist [place] Patient Advocate Team Direct Number: [phone] Fax: [phone]
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
## 2021-12-30 NOTE — Progress Notes (Addendum)
[facility] ADULT ICU REPLACEMENT PROTOCOL   The patient does apply for the [facility] Adult ICU Electrolyte Replacment Protocol based on the criteria listed below:   1.Exclusion criteria: TCTS patients, ECMO patients, and Dialysis patients 2. Is GFR >/= 30 ml/min? Yes.    Patient's GFR today is 47 3. Is SCr </= 2? Yes.   Patient's SCr is 1.62 mg/dL 4. Did SCr increase >/= 0.5 in 24 hours? No. 5.Pt's weight >40kg  Yes.   6. Abnormal electrolyte(s): k 3.6  7. Electrolytes replaced per protocol 8.  Call MD STAT for K+ </= 2.5, Phos </= 1, or Mag </= 1 Physician:    Ronda Fairly A 12/30/2021 4:23 AM  Added mag level d/t ectopy and ETOH withdrawal. Resulted 1.6, replaced

## 2021-12-30 NOTE — TOC Initial Note (Signed)
Transition of Care [facility]) - Initial/Assessment Note    Patient Details  Name: Kevin Smith MRN: [id number] Date of Birth: December 11, 1955  Transition [facility]) CM/SW Contact:    Milas Gain, [place] Phone Number: 12/30/2021, 10:29 AM  Clinical Narrative:                  CSW received consult for substance use resources for patient. CSW met with patient at bedside. Patient reports he comes from home alone. CSW offered patient outpatient substance use treatment services resources. Patient accepted. Patient reports his daughter or son will be able to provide transportation at dc. All questions answered. No further questions reported at this time. Expected Discharge Plan:  (TBD) Barriers to Discharge: Continued Medical Work up   Patient Goals and CMS Choice Patient states their goals for this hospitalization and ongoing recovery are:: to return back home CMS Medicare.gov Compare Post Acute Care list provided to:: Patient Choice offered to / list presented to : Patient  Expected Discharge Plan and Services Expected Discharge Plan:  (TBD) In-house Referral: Clinical Social Work     Living arrangements for the past 2 months: Single Family Home                                      Prior Living Arrangements/Services Living arrangements for the past 2 months: Single Family Home Lives with:: Self Patient language and need for interpreter reviewed:: Yes Do you feel safe going back to the place where you live?: Yes      Need for Family Participation in Patient Care: Yes (Comment) Care giver support system in place?: Yes (comment)   Criminal Activity/Legal Involvement Pertinent to Current Situation/Hospitalization: No - Comment as needed  Activities of Daily Living Home Assistive Devices/Equipment: None ADL Screening (condition at time of admission) Patient's cognitive ability adequate to safely complete daily activities?: Yes Is the patient deaf or have difficulty hearing?:  No Does the patient have difficulty seeing, even when wearing glasses/contacts?: No Does the patient have difficulty concentrating, remembering, or making decisions?: No Patient able to express need for assistance with ADLs?: Yes Does the patient have difficulty dressing or bathing?: No Independently performs ADLs?: Yes (appropriate for developmental age) Does the patient have difficulty walking or climbing stairs?: No Weakness of Legs: None Weakness of Arms/Hands: None  Permission Sought/Granted Permission sought to share information with : Case Manager, Customer service manager, Family Supports Permission granted to share information with : Yes, Verbal Permission Granted  Share Information with NAME: Jeannetta Nap     Permission granted to share info w Relationship: daughter  Permission granted to share info w Contact Information: Jeannetta Nap [phone]  Emotional Assessment Appearance:: Appears stated age Attitude/Demeanor/Rapport: Gracious Affect (typically observed): Calm Orientation: : Oriented to Self, Oriented to Place, Oriented to  Time, Oriented to Situation Alcohol / Substance Use: Not Applicable Psych Involvement: No (comment)  Admission diagnosis:  NSTEMI (non-ST elevated myocardial infarction) [facility]) [I21.4] Patient Active Problem List   Diagnosis Date Noted   NSTEMI (non-ST elevated myocardial infarction) ([place]) 12/27/2021   Primary insomnia 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   Restless legs 11/09/2021   Cigarette smoker 08/18/2021   Upper airway cough syndrome 08/18/2021   Cervical radiculopathy 03/21/2021   History of ST elevation myocardial infarction (STEMI) 12/22/2020   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 ([place]) 09/08/2020   Macrocytosis 03/31/2020   Gastroesophageal reflux disease 01/02/2020   Erectile dysfunction 01/02/2020  COPD with acute exacerbation ([place]) 09/15/2018   Coronary artery disease involving coronary bypass graft of native heart with angina  pectoris ([place]) 03/12/2017   COPD GOLD 2  11/22/2015   Nicotine dependence, uncomplicated 49/61/1643   Aortic aneurysm ([place]) 2017   Essential hypertension, benign 01/04/2015   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   Hyperlipidemia with target LDL less than 70 12/07/2012   PCP:  Loman Brooklyn, FNP Pharmacy:   [facility] [address] [place] [address] [place] [address] Phone: [phone] Fax: [place], [place] [address] [place] [address] Phone: [phone] Fax: [phone]     Social Determinants of Health (SDOH) Interventions    Readmission Risk Interventions     No data to display

## 2021-12-31 DIAGNOSIS — N179 Acute kidney failure, unspecified: Secondary | ICD-10-CM | POA: Diagnosis present

## 2021-12-31 DIAGNOSIS — I5022 Chronic systolic (congestive) heart failure: Secondary | ICD-10-CM | POA: Diagnosis present

## 2021-12-31 DIAGNOSIS — I5023 Acute on chronic systolic (congestive) heart failure: Secondary | ICD-10-CM | POA: Diagnosis present

## 2021-12-31 DIAGNOSIS — I214 Non-ST elevation (NSTEMI) myocardial infarction: Secondary | ICD-10-CM | POA: Diagnosis not present

## 2021-12-31 DIAGNOSIS — I10 Essential (primary) hypertension: Secondary | ICD-10-CM | POA: Diagnosis not present

## 2021-12-31 DIAGNOSIS — N189 Chronic kidney disease, unspecified: Secondary | ICD-10-CM

## 2021-12-31 DIAGNOSIS — F1010 Alcohol abuse, uncomplicated: Secondary | ICD-10-CM | POA: Diagnosis present

## 2021-12-31 DIAGNOSIS — E669 Obesity, unspecified: Secondary | ICD-10-CM

## 2021-12-31 LAB — CBC
HCT: 38.1 % — ABNORMAL LOW (ref 39.0–52.0)
Hemoglobin: 13 g/dL (ref 13.0–17.0)
MCH: 32.3 pg (ref 26.0–34.0)
MCHC: 34.1 g/dL (ref 30.0–36.0)
MCV: 94.5 fL (ref 80.0–100.0)
Platelets: 205 10*3/uL (ref 150–400)
RBC: 4.03 MIL/uL — ABNORMAL LOW (ref 4.22–5.81)
RDW: 13.7 % (ref 11.5–15.5)
WBC: 9.9 10*3/uL (ref 4.0–10.5)
nRBC: 0 % (ref 0.0–0.2)

## 2021-12-31 LAB — BASIC METABOLIC PANEL
Anion gap: 8 (ref 5–15)
BUN: 29 mg/dL — ABNORMAL HIGH (ref 8–23)
CO2: 23 mmol/L (ref 22–32)
Calcium: 8.4 mg/dL — ABNORMAL LOW (ref 8.9–10.3)
Chloride: 104 mmol/L (ref 98–111)
Creatinine, Ser: 1.38 mg/dL — ABNORMAL HIGH (ref 0.61–1.24)
GFR, Estimated: 57 mL/min — ABNORMAL LOW (ref >60)
Glucose, Bld: 87 mg/dL (ref 70–99)
Potassium: 4.1 mmol/L (ref 3.5–5.1)
Sodium: 135 mmol/L (ref 135–145)

## 2021-12-31 MED ORDER — EMPAGLIFLOZIN 10 MG PO TABS
10.0000 mg | ORAL_TABLET | Freq: Every day | ORAL | Status: DC
  Administered 2021-12-31 – 2022-01-02 (×3): 10 mg via ORAL
  Filled 2021-12-31 (×3): qty 1

## 2021-12-31 MED ORDER — ORAL CARE MOUTH RINSE: 15.0000 mL | OROMUCOSAL | Status: DC | PRN

## 2021-12-31 MED ORDER — ZOLPIDEM TARTRATE 5 MG PO TABS
5.0000 mg | ORAL_TABLET | Freq: Every evening | ORAL | Status: DC | PRN
  Administered 2021-12-31 – 2022-01-01 (×2): 5 mg via ORAL
  Filled 2021-12-31 (×2): qty 1

## 2021-12-31 NOTE — Assessment & Plan Note (Signed)
Acute metabolic encephalopathy Acute alcohol withdrawal syndrome.   Patient today is calm and cooperative no agitation or confusion Continue with chlordiazepoxide, neuro checks per unit protocol, Pt and Ot.

## 2021-12-31 NOTE — Assessment & Plan Note (Signed)
Continue blood pressure control with metoprolol and spironolactone. Added entresto with good toleration.

## 2021-12-31 NOTE — Assessment & Plan Note (Signed)
Calculated BMI is 33,5  

## 2021-12-31 NOTE — Progress Notes (Signed)
CARDIAC REHAB PHASE I   PRE:  Rate/Rhythm: 81 NSR  BP:  Sitting: 104/71      SaO2: 95 3L  MODE:  Ambulation: 170 ft   POST:  Rate/Rhythm: 92 NSR  BP:  Sitting:       SaO2: 98 3L  Pt was ambulated in the hallway with moderate assistance with 3L and RW. Pt was unsteady at times even with slow gait. Pt was returned to room and educated on MI with MI book, angioplasty, plavix and asa, risk factors, restrictions, heart healthy diet, ex guidelines, ntg use, and CRPII. Pt would like to attend but pt doesn't drive. Will refer to AP.   Kevin Smith  10:28 AM 12/31/2021

## 2021-12-31 NOTE — Plan of Care (Signed)
  Problem: Health Behavior/Discharge Planning: Goal: Ability to safely manage health-related needs after discharge will improve Outcome: Progressing   Problem: Cardiovascular: Goal: Ability to achieve and maintain adequate cardiovascular perfusion will improve Outcome: Progressing Goal: Vascular access site(s) Level 0-1 will be maintained Outcome: Progressing   Problem: Activity: Goal: Ability to tolerate increased activity will improve Outcome: Progressing   Problem: Pain Managment: Goal: General experience of comfort will improve Outcome: Progressing   Problem: Safety: Goal: Ability to remain free from injury will improve Outcome: Progressing   Problem: Skin Integrity: Goal: Risk for impaired skin integrity will decrease Outcome: Progressing   Problem: Clinical Measurements: Goal: Respiratory complications will improve Outcome: Progressing

## 2021-12-31 NOTE — Assessment & Plan Note (Signed)
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  Hypomagnesemia   Serum cr today is 1,50 with k at 4,2 and serum bicarbonate at 27  Continue spironolactone and SGLT 2 inh  Follow up renal function in am, avoid hypotension and nephrotoxic medications.

## 2021-12-31 NOTE — Assessment & Plan Note (Signed)
No clinical sings of exacerbation Continue with smoking cessation counseling Continue with bronchodilator therapy and inhaled corticosteroids.  Add dulera and scheduled albuterol nebs.

## 2021-12-31 NOTE — Progress Notes (Signed)
Advanced Heart Failure Rounding Note  PCP-Cardiologist: Dr. Zandra Abts   Subjective:    Off milrinone.  Transferred to the floor yesterday.  No chest pain but does have shortness of breath.  .  Objective:   Weight Range: 109.2 kg Body mass index is 33.58 kg/m.   Vital Signs:   Temp:  [97.3 F (36.3 C)-98.3 F (36.8 C)] 98 F (36.7 C) (10/21 0754) Pulse Rate:  [75-91] 79 (10/21 0904) Resp:  [14-35] 16 (10/21 0754) BP: (104-126)/(70-87) 104/71 (10/21 0904) SpO2:  [93 %-99 %] 94 % (10/21 0812) Arterial Line BP: (115)/(68) 115/68 (10/20 1200) Weight:  [109.2 kg] 109.2 kg (10/20 2245) Last BM Date : 12/30/21  Weight change: Filed Weights   12/27/21 1937 12/30/21 0500 12/30/21 2245  Weight: 78.1 kg 105.7 kg 109.2 kg    Intake/Output:   Intake/Output Summary (Last 24 hours) at 12/31/2021 1121 Last data filed at 12/31/2021 0913 Gross per 24 hour  Intake 303 ml  Output 1585 ml  Net -1282 ml     CVP 4   Physical Exam    GEN: Well nourished, well developed, in no acute distress  HEENT: normal  Neck: no JVD, carotid bruits, or masses Cardiac: RRR; no murmurs, rubs, or gallops,no edema  Respiratory: Crackles at the bases GI: soft, nontender, nondistended, + BS MS: no deformity or atrophy  Skin: warm and dry,  Neuro:  Strength and sensation are intact Psych: euthymic mood, full affect    Telemetry  SR 80-90s   EKG    N/A   Labs    CBC Recent Labs    12/30/21 0331 12/31/21 0218  WBC 15.6* 9.9  HGB 13.0 13.0  HCT 37.6* 38.1*  MCV 93.3 94.5  PLT 199 585    Basic Metabolic Panel Recent Labs    12/30/21 0331 12/31/21 0218  NA 137 135  K 3.6 4.1  CL 100 104  CO2 24 23  GLUCOSE 95 87  BUN 33* 29*  CREATININE 1.62* 1.38*  CALCIUM 8.7* 8.4*  MG 1.6*  --     Liver Function Tests Recent Labs    12/28/21 2112  AST 93*  ALT 34  ALKPHOS 58  BILITOT 0.8  PROT 6.9  ALBUMIN 3.6    No results for input(s): "LIPASE", "AMYLASE" in the  last 72 hours. Cardiac Enzymes No results for input(s): "CKTOTAL", "CKMB", "CKMBINDEX", "TROPONINI" in the last 72 hours.  BNP: BNP (last 3 results) Recent Labs    12/29/21 0915  BNP 226.7*     ProBNP (last 3 results) No results for input(s): "PROBNP" in the last 8760 hours.   D-Dimer No results for input(s): "DDIMER" in the last 72 hours. Hemoglobin A1C No results for input(s): "HGBA1C" in the last 72 hours.  Fasting Lipid Panel No results for input(s): "CHOL", "HDL", "LDLCALC", "TRIG", "CHOLHDL", "LDLDIRECT" in the last 72 hours.  Thyroid Function Tests No results for input(s): "TSH", "T4TOTAL", "T3FREE", "THYROIDAB" in the last 72 hours.  Invalid input(s): "FREET3"   Other results:   Imaging    No results found.   Medications:     Scheduled Medications:  AeroChamber Plus Flo-Vu Medium  1 each Other Once   aspirin  81 mg Oral Daily   atorvastatin  80 mg Oral Daily   budesonide (PULMICORT) nebulizer solution  0.25 mg Nebulization BID   chlordiazePOXIDE  25 mg Oral TID   Followed by   Derrill Memo ON 01/01/2022] chlordiazePOXIDE  25 mg Oral BH-qamhs  Followed by   Melene Muller ON 01/02/2022] chlordiazePOXIDE  25 mg Oral Daily   clopidogrel  75 mg Oral Q breakfast   enoxaparin (LOVENOX) injection  40 mg Subcutaneous Q24H   ezetimibe  10 mg Oral Daily   famotidine  20 mg Oral Daily   folic acid  1 mg Oral Daily   metoprolol succinate  12.5 mg Oral Daily   multivitamin with minerals  1 tablet Oral Daily   nicotine  21 mg Transdermal Daily   pantoprazole  40 mg Oral Daily   rOPINIRole  2 mg Oral QHS   sodium chloride flush  10-40 mL Intracatheter Q12H   sodium chloride flush  3 mL Intravenous Q12H   spironolactone  25 mg Oral Daily   thiamine  100 mg Oral Daily   Or   thiamine  100 mg Intravenous Daily   umeclidinium bromide  1 puff Inhalation Daily    Infusions:  sodium chloride Stopped (12/29/21 0842)   sodium chloride Stopped (12/29/21 1041)   sodium  chloride Stopped (12/29/21 0838)   dexmedetomidine (PRECEDEX) IV infusion Stopped (12/29/21 1219)   nitroGLYCERIN Stopped (12/28/21 1600)    PRN Medications: sodium chloride, sodium chloride, sodium chloride, acetaminophen, albuterol, chlordiazePOXIDE, guaiFENesin, hydrOXYzine, loperamide, nitroGLYCERIN, ondansetron (ZOFRAN) IV, ondansetron, mouth rinse, sodium chloride flush, zolpidem    Patient Profile   66 y/o male w/ history of chronic systolic heart failure due to ICM, CAD s/p CABG in 2014 w/ subsequent recurrent MIs and stenting, HTN, HLD, tobacco abuse and ETOH abuse, admitted w/ acute STEMI c/b a/c CHF and hypoxic respiratory failure.   Assessment/Plan   1.  Coronary artery disease/acute STEMI: CABG in 2014 with subsequent PCI's.  Found to have occluded circumflex post stent.  No current chest pain.  2.  Acute on chronic systolic heart failure: Ejection fraction 30 to 35%.  Crackles at the bases.  Creatinine has improved.  Hold off on diuresis for now  3.  Acute hypoxic respiratory failure: Critical care medicine following.  Continue nasal cannula oxygen.  4.  Acute renal failure: Received IV fluids postcatheterization.  Suspect ATN.  We Natajah Derderian hold off on diuresis today.  5.  Tobacco abuse: Complete cessation encouraged  6.  Alcohol abuse: Complete cessation encouraged  Stop IV fluids. Stop heparin and milrinone. Optimize GDMT .   Length of Stay: 4  [place] Marchiano Jorja Loa, MD  12/31/2021, 11:21 AM  Advanced Heart Failure Team Pager [phone] (M-F; 7a - 5p)  Please contact [facility] for night-coverage after hours (5p -7a ) and weekends on [email or web address]

## 2021-12-31 NOTE — Assessment & Plan Note (Addendum)
Coronary artery disease with ischemic cardiomyopathy.  Sp CABG 2014   10/17 cardiac catheterization  Severe native CAD with total occlusion of LAD, 70% stenosis in high marginal ramus like vessel, 10% stenosis in the obtuse marginal vessel with total occlusion of the mid circumflex involving segmental with faint collateralization to distal marginal branch. Native RCA occluded in its mid segment. Faint collateralization of the PDA via the left circulation.  Patent LIMA to LAD Ostial occlusion to SVG circumflex and PDA   SP PCI to mid circumflex, cutting balloon within the entire stented segment.   Plan to continue with dual antiplatelet therapy with aspirin and clopidogrel indefinitely.  Continue with statin therapy.  Multidisciplinary discussion recommended medical therapy rather than further invasive intervention.

## 2021-12-31 NOTE — Hospital Course (Addendum)
Kevin Smith was admitted to the hospital with the working diagnosis of NSTEMI, complicated with acute on chronic systolic heart failure.  Hospitalization complicated with alcohol withdrawal and encephalopathy.   66 yo male with the past medical history of CAD sp CABG, hypertension, dyslipidemia, etho abuse, ascending aortic aneurysm, depression and COPD who presented with chest pain and dyspnea. Patient reported acute onset of chest pain, radiating to his arm and associated with dyspnea. Because persistent symptoms EMS was called, he had borderline inferior ST elevations in the setting or right bundle branch block (STEMI was not called due to bundle branch block). On his initial physical examination his blood pressure was 130/97, HR 72, RR 18 and 02 saturation 99%, lungs with diffuse wheezing and rhonchi, heart with S1 and S2 present and rhythmic, abdomen with no distention, no lower extremity edema.   Na 142, K 3,9 CL 102, bicarbonate 24, glucose 95 bun 14 cr 1,49  High sensitive troponin 64 -162-996   Wbc 10,5 hgb 16,0 plt 223   EKG 71 bpm, normal axis, right bundle branch block, sinus rhythm with ST elevation lead II, III, AvF, with no significant T wave changes.   Chest radiograph with mild cardiomegaly, mild hilar vascular congestion, no effusions. Sternotomy wires in place.   10/17 Patient underwent cardiac catheterization and was found to have diffuse coronary artery disease, and underwent PCI to circumflex.  Noted high filling pressures on right heart catheterization and placed on furosemide for diuresis and milrinone for inotropic support.    10/18 transferred to ICU due to agitation and respiratory distress with hypoxemia. He was placed on non invasive mechanical ventilation.  Required phenobarbital in order to be liberated from Bipap.   10/20 Started on alcohol withdrawal protocol plus dexmedetomidine with improvement of his symptoms. Discontinued milrinone  10/21 transfer to [facility].    Taper chlordiazepoxide.   10/22 clinically stable, encephalopathy has improved.  10/23 patient expressed depression and suicidal thoughts, psychiatry was consulted.  Patient was deemed stable to be discharged home with outpatient follow up.  Follow up with cardiology as outpatient.

## 2021-12-31 NOTE — Progress Notes (Signed)
Progress Note   Patient: Kevin Smith XTG:[id number] DOB: 1955-12-06 DOA: 12/27/2021     4 DOS: the patient was seen and examined on 12/31/2021   Brief hospital course: Kevin Smith was admitted to the hospital with the working diagnosis of NSTEMI, complicated with acute on chronic systolic heart failure.   66 yo male with the past medical history of CAD sp CABG, hypertension, dyslipidemia, etho abuse, ascending aortic aneurysm, depression and COPD who presented with chest pain and dyspnea. Patient reported acute onset of chest pain, radiating to his arm and associated with dyspnea. Because persistent symptoms EMS was called, he had borderline inferior ST elevations in the setting or right bundle branch block (STEMI was not called due to bundle branch block). On his initial physical examination his blood pressure was 130/97, HR 72, RR 18 and 02 saturation 99%, lungs with diffuse wheezing and rhonchi, heart with S1 and S2 present and rhythmic, abdomen with no distention, no lower extremity edema.   Na 142, K 3,9 CL 102, bicarbonate 24, glucose 95 bun 14 cr 1,49  High sensitive troponin 64 -162-996   Wbc 10,5 hgb 16,0 plt 223   EKG 71 bpm, normal axis, right bundle branch block, sinus rhythm with ST elevation lead II, III, AvF, with no significant T wave changes.   Chest radiograph with mild cardiomegaly, mild hilar vascular congestion, no effusions. Sternotomy wires in place.   10/17 Patient underwent cardiac catheterization and was found to have diffuse coronary artery disease, and underwent PCI to circumflex.  Noted high filling pressures on right heart catheterization and placed on furosemide for diuresis and milrinone.    10/18 transfer to ICU due to agitation and respiratory distress with hypoxemia. He was placed on non invasive mechanical ventilation.  Required phenobarbital to be liberated from Bipap.   10/20 Started on alcohol withdrawal protocol with improvement of his  symptoms. Discontinued milrinone  10/21 transfer to [facility]      Assessment and Plan: * NSTEMI (non-ST elevated myocardial infarction) ([place]) Coronary artery disease with ischemic cardiomyopathy.  Sp CABG 2014   10/17 cardiac catheterization  Severe native CAD with total occlusion of LAD, 70% stenosis in high marginal ramus like vessel, 10% stenosis in the obtuse marginal vessel with total occlusion of the mid circumflex involving segmental with faint collateralization to distal marginal branch. Native RCA occluded in its mid segment. Faint collateralization of the PDA via the left circulation.  Patent LIMA to LAD Ostial occlusion to SVG circumflex and PDA   SP PCI to mid circumflex, cutting balloon within the entire stented segment.   Plan to continue with dual antiplatelet therapy with aspirin and clopidogrel indefinitely.  Continue with statin therapy.  Multidisciplinary discussion recommended medical therapy rather than further invasive intervention.   Acute on chronic systolic CHF (congestive heart failure) (HCC) Echocardiogram with reduced LV systolic function 30 to 27%, Akinesis of the LV basal to mid inferoseptal, basal to mid inferior, and basal to mid inferolateral walls. Moderate dilatation of LV internal cavity. Trivial pericardial effusion.   RV systolic function with mild reduction, severe dilatation of the ascending aorta, 49 mm.   Urine output is 1,690  Since admission fluid balance is negative 0,350 ml Systolic blood pressure is 104 to 127 mmHg.   Continue medical therapy with metoprolol and spironolactone.  Start SGLT 2 inh today. If blood pressure tolerate and renal function stable will add ARB.   Acute hypoxemic respiratory failure due to acute cardiogenic pulmonary edema. It has improved with  diuresis  Today 02 saturation is 94% on 3 L/min per [place]  Essential hypertension Continue blood pressure control with metoprolol and spironolactone.  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  Hypomagnesemia   Renal function with serum cr at 1,38 with K at 4,1 and serum bicarbonate at 23. Continue spironolactone and start on SGLT 2 inh  Follow up renal function in am, avoid hypotension and nephrotoxic medications.   COPD GOLD 2  No clinical sings of exacerbation Continue with smoking cessation counseling Continue with bronchodilator therapy and inhaled corticosteroids.   Alcohol abuse Acute metabolic encephalopathy Acute alcohol withdrawal syndrome.   Patient today is calm and cooperative no agitation or confusion Continue with chlordiazepoxide, neuro checks per unit protocol, Pt and Ot.   Hyperlipidemia with target LDL less than 70 Continue with statin therapy.   Class 1 obesity Calculated BMI is 33,5         Subjective: Patient with no chest pain, dyspnea has been improving, but continue to be very weak and deconditioned   Physical Exam: Vitals:   12/31/21 0439 12/31/21 0754 12/31/21 0812 12/31/21 0904  BP: 119/80 108/83  104/71  Pulse: 81 81  79  Resp: (!) 24 16    Temp: 98.3 F (36.8 C) 98 F (36.7 C)    TempSrc: Oral Oral    SpO2: 94%  94%   Weight:      Height:       Neurology awake and alert ENT With mild pallor Cardiovascular with S1 and S2 present and rhythmic with no gallops, rubs or murmurs Respiratory with rales at bases and expiratory wheezing at bases Abdomen with no distention  No lower extremity edema  Data Reviewed:    Family Communication: no family at the bedside   Disposition: Status is: Inpatient Remains inpatient appropriate because: heart failure management   Planned Discharge Destination: Home   Author: Tawni Millers, MD 12/31/2021 12:39 PM  For on call review [email or web address].

## 2021-12-31 NOTE — Assessment & Plan Note (Addendum)
Continue with statin and ezetimibe therapy.  

## 2021-12-31 NOTE — Assessment & Plan Note (Addendum)
Echocardiogram with reduced LV systolic function 30 to 71%, Akinesis of the LV basal to mid inferoseptal, basal to mid inferior, and basal to mid inferolateral walls. Moderate dilatation of LV internal cavity. Trivial pericardial effusion.   RV systolic function with mild reduction, severe dilatation of the ascending aorta, 49 mm.   Urine output is 0626 Systolic blood pressure is 117 to 130 mmHg.   Continue medical therapy with metoprolol and spironolactone.  Continue with SGLT 2 inh Add ARB when renal function more stable.   Acute hypoxemic respiratory failure due to acute cardiogenic pulmonary edema. It has improved with diuresis  02 saturation is 97% on room air, no orthopnea.

## 2022-01-01 DIAGNOSIS — N179 Acute kidney failure, unspecified: Secondary | ICD-10-CM | POA: Diagnosis not present

## 2022-01-01 DIAGNOSIS — I214 Non-ST elevation (NSTEMI) myocardial infarction: Secondary | ICD-10-CM | POA: Diagnosis not present

## 2022-01-01 DIAGNOSIS — I10 Essential (primary) hypertension: Secondary | ICD-10-CM | POA: Diagnosis not present

## 2022-01-01 DIAGNOSIS — I5023 Acute on chronic systolic (congestive) heart failure: Secondary | ICD-10-CM | POA: Diagnosis not present

## 2022-01-01 LAB — MAGNESIUM: Magnesium: 2.1 mg/dL (ref 1.7–2.4)

## 2022-01-01 LAB — BASIC METABOLIC PANEL
Anion gap: 7 (ref 5–15)
BUN: 25 mg/dL — ABNORMAL HIGH (ref 8–23)
CO2: 27 mmol/L (ref 22–32)
Calcium: 9.3 mg/dL (ref 8.9–10.3)
Chloride: 103 mmol/L (ref 98–111)
Creatinine, Ser: 1.5 mg/dL — ABNORMAL HIGH (ref 0.61–1.24)
GFR, Estimated: 51 mL/min — ABNORMAL LOW (ref >60)
Glucose, Bld: 102 mg/dL — ABNORMAL HIGH (ref 70–99)
Potassium: 4.2 mmol/L (ref 3.5–5.1)
Sodium: 137 mmol/L (ref 135–145)

## 2022-01-01 MED ORDER — MOMETASONE FURO-FORMOTEROL FUM 100-5 MCG/ACT IN AERO
2.0000 | INHALATION_SPRAY | Freq: Two times a day (BID) | RESPIRATORY_TRACT | Status: DC
  Administered 2022-01-02: 2 via RESPIRATORY_TRACT
  Filled 2022-01-01: qty 8.8

## 2022-01-01 MED ORDER — ALBUTEROL SULFATE (2.5 MG/3ML) 0.083% IN NEBU: 2.5000 mg | INHALATION_SOLUTION | Freq: Four times a day (QID) | RESPIRATORY_TRACT | Status: DC

## 2022-01-01 MED ORDER — ALBUTEROL SULFATE (2.5 MG/3ML) 0.083% IN NEBU: 2.5000 mg | INHALATION_SOLUTION | Freq: Four times a day (QID) | RESPIRATORY_TRACT | Status: DC | PRN

## 2022-01-01 NOTE — Plan of Care (Signed)
  Problem: Health Behavior/Discharge Planning: Goal: Ability to safely manage health-related needs after discharge will improve Outcome: Progressing   Problem: Activity: Goal: Ability to tolerate increased activity will improve Outcome: Progressing   Problem: Cardiac: Goal: Ability to achieve and maintain adequate cardiovascular perfusion will improve Outcome: Progressing   Problem: Education: Goal: Ability to safely manage health related needs after discharge will improve Outcome: Progressing   Problem: Clinical Measurements: Goal: Respiratory complications will improve Outcome: Progressing   Problem: Coping: Goal: Level of anxiety will decrease Outcome: Progressing   Problem: Pain Managment: Goal: General experience of comfort will improve Outcome: Progressing   Problem: Safety: Goal: Ability to remain free from injury will improve Outcome: Progressing   Problem: Skin Integrity: Goal: Risk for impaired skin integrity will decrease Outcome: Progressing
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
## 2022-01-01 NOTE — Progress Notes (Signed)
Progress Note   Patient: Kevin Smith UTM:[id number] DOB: 20-Feb-1956 DOA: 12/27/2021     5 DOS: the patient was seen and examined on 01/01/2022   Brief hospital course: Kevin Smith was admitted to the hospital with the working diagnosis of NSTEMI, complicated with acute on chronic systolic heart failure.   66 yo male with the past medical history of CAD sp CABG, hypertension, dyslipidemia, etho abuse, ascending aortic aneurysm, depression and COPD who presented with chest pain and dyspnea. Patient reported acute onset of chest pain, radiating to his arm and associated with dyspnea. Because persistent symptoms EMS was called, he had borderline inferior ST elevations in the setting or right bundle branch block (STEMI was not called due to bundle branch block). On his initial physical examination his blood pressure was 130/97, HR 72, RR 18 and 02 saturation 99%, lungs with diffuse wheezing and rhonchi, heart with S1 and S2 present and rhythmic, abdomen with no distention, no lower extremity edema.   Na 142, K 3,9 CL 102, bicarbonate 24, glucose 95 bun 14 cr 1,49  High sensitive troponin 64 -162-996   Wbc 10,5 hgb 16,0 plt 223   EKG 71 bpm, normal axis, right bundle branch block, sinus rhythm with ST elevation lead II, III, AvF, with no significant T wave changes.   Chest radiograph with mild cardiomegaly, mild hilar vascular congestion, no effusions. Sternotomy wires in place.   10/17 Patient underwent cardiac catheterization and was found to have diffuse coronary artery disease, and underwent PCI to circumflex.  Noted high filling pressures on right heart catheterization and placed on furosemide for diuresis and milrinone.    10/18 transfer to ICU due to agitation and respiratory distress with hypoxemia. He was placed on non invasive mechanical ventilation.  Required phenobarbital to be liberated from Bipap.   10/20 Started on alcohol withdrawal protocol plus dexmedetomidine with improvement  of his symptoms. Discontinued milrinone  10/21 transfer to [facility].    10/22 clinically stable, encephalopathy has improved.   Assessment and Plan: * NSTEMI (non-ST elevated myocardial infarction) ([place]) Coronary artery disease with ischemic cardiomyopathy.  Sp CABG 2014   10/17 cardiac catheterization  Severe native CAD with total occlusion of LAD, 70% stenosis in high marginal ramus like vessel, 10% stenosis in the obtuse marginal vessel with total occlusion of the mid circumflex involving segmental with faint collateralization to distal marginal branch. Native RCA occluded in its mid segment. Faint collateralization of the PDA via the left circulation.  Patent LIMA to LAD Ostial occlusion to SVG circumflex and PDA   SP PCI to mid circumflex, cutting balloon within the entire stented segment.   Plan to continue with dual antiplatelet therapy with aspirin and clopidogrel indefinitely.  Continue with statin therapy.  Multidisciplinary discussion recommended medical therapy rather than further invasive intervention.   Acute on chronic systolic CHF (congestive heart failure) (HCC) Echocardiogram with reduced LV systolic function 30 to 56%, Akinesis of the LV basal to mid inferoseptal, basal to mid inferior, and basal to mid inferolateral walls. Moderate dilatation of LV internal cavity. Trivial pericardial effusion.   RV systolic function with mild reduction, severe dilatation of the ascending aorta, 49 mm.   Urine output is 8127 Systolic blood pressure is 117 to 130 mmHg.   Continue medical therapy with metoprolol and spironolactone.  Continue with SGLT 2 inh Add ARB when renal function more stable.   Acute hypoxemic respiratory failure due to acute cardiogenic pulmonary edema. It has improved with diuresis  02 saturation is  97% on room air, no orthopnea.   Essential hypertension Continue blood pressure control with metoprolol and spironolactone.  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  Hypomagnesemia   Serum cr today is 1,50 with k at 4,2 and serum bicarbonate at 27  Continue spironolactone and SGLT 2 inh  Follow up renal function in am, avoid hypotension and nephrotoxic medications.   COPD GOLD 2  No clinical sings of exacerbation Continue with smoking cessation counseling Continue with bronchodilator therapy and inhaled corticosteroids.  Add dulera and scheduled albuterol nebs.   Alcohol abuse Acute metabolic encephalopathy Acute alcohol withdrawal syndrome.   He has been calm and cooperative no agitation or confusion Continue with chlordiazepoxide, neuro checks per unit protocol, Pt and Ot.   Hyperlipidemia with target LDL less than 70 Continue with statin and ezetimibe therapy.   Class 1 obesity Calculated BMI is 33,5         Subjective: Patient with no chest pain, dyspnea has been stable   Physical Exam: Vitals:   01/01/22 0457 01/01/22 0806 01/01/22 0815 01/01/22 1018  BP: 127/88 126/83  117/86  Pulse: 72 81 82 75  Resp: 20  17 16   Temp: 97.8 F (36.6 C)   97.8 F (36.6 C)  TempSrc: Oral   Oral  SpO2: 97% 95% 99% 97%  Weight: 106.7 kg     Height:       Neurology awake and alert ENT with no pallor Cardiovascular with S1 and S2 present and rhythmic with no gallops, rubs or murmurs Respiratory with prolonged expiratory phase and scattered rhonchi, with no wheezing Abdomen with no distention  No lower extremity edema  Data Reviewed:    Family Communication: no family at the bedside   Disposition: Status is: Inpatient Remains inpatient appropriate because: heart failure   Planned Discharge Destination: Home  Author: Tawni Millers, MD 01/01/2022 1:17 PM  For on call review [email or web address].

## 2022-01-01 NOTE — Evaluation (Signed)
Occupational Therapy Evaluation Patient Details Name: Kevin Smith MRN: [id number] DOB: 21-Oct-1955 Today's Date: 01/01/2022   History of Present Illness Pt is a 66 y/o M presenting to ED on 10/17 wtih chest discomfort and dyspnea. Admitted for NSTEMI, underwent PCI to circumflex arter 10/17, CIWA initiated 10/20. PMH includes CAD s/p CABG (2014), COPD, depression, GERD, HLD, HTN, arthirits, anxiety, asthma, and aortic aneurysm.   Clinical Impression   Pt reports independence at baseline with ADLs and functional mobility, lives alone and uses transportation services. Pt with decreased cognition and safety awareness, initially providing incorrect PLOF, needing cues to wait to have BP taken due to dizziness before getting up. Pt needing min-max A for ADLs, supervision for bed mobility, and min A for transfers without AD. Pt with mild unsteadiness reaching for counters/bedrails for support when walking. Pt presenting with impairments listed below, will follow acutely. Recommend HHOT at d/c pending progression.      Recommendations for follow up therapy are one component of a multi-disciplinary discharge planning process, led by the attending physician.  Recommendations may be updated based on patient status, additional functional criteria and insurance authorization.   Follow Up Recommendations  Home health OT    Assistance Recommended at Discharge Set up Supervision/Assistance  Patient can return home with the following A little help with walking and/or transfers;A little help with bathing/dressing/bathroom;Assistance with cooking/housework;Direct supervision/assist for medications management;Direct supervision/assist for financial management;Assist for transportation;Help with stairs or ramp for entrance    Functional Status Assessment  Patient has had a recent decline in their functional status and demonstrates the ability to make significant improvements in function in a reasonable and  predictable amount of time.  Equipment Recommendations  Other (comment) (RW)    Recommendations for Other Services PT consult     Precautions / Restrictions Precautions Precautions: Fall Restrictions Weight Bearing Restrictions: No      Mobility Bed Mobility Overal bed mobility: Needs Assistance Bed Mobility: Sidelying to Sit   Sidelying to sit: Supervision       General bed mobility comments: + dizziness, BP and SPO2 WNL    Transfers Overall transfer level: Needs assistance Equipment used: None Transfers: Sit to/from Stand Sit to Stand: Min assist           General transfer comment: pt with mild unsteadiness needing time to "adjust" to standing      Balance Overall balance assessment: Needs assistance Sitting-balance support: Feet supported Sitting balance-Leahy Scale: Good Sitting balance - Comments: seated EOB, can reach outside BOS without LOB   Standing balance support: During functional activity, Reliant on assistive device for balance Standing balance-Leahy Scale: Fair Standing balance comment: reaching for external supports with mobility                           ADL either performed or assessed with clinical judgement   ADL Overall ADL's : Needs assistance/impaired Eating/Feeding: Supervision/ safety   Grooming: Minimal assistance   Upper Body Bathing: Minimal assistance   Lower Body Bathing: Minimal assistance   Upper Body Dressing : Minimal assistance   Lower Body Dressing: Maximal assistance   Toilet Transfer: Minimal assistance;Ambulation;Regular Toilet   Toileting- Clothing Manipulation and Hygiene: Minimal assistance       Functional mobility during ADLs: Minimal assistance       Vision   Vision Assessment?: No apparent visual deficits     Perception     Praxis      Pertinent Vitals/Pain Pain  Assessment Pain Assessment: No/denies pain     Hand Dominance Right   Extremity/Trunk Assessment Upper  Extremity Assessment Upper Extremity Assessment: Overall WFL for tasks assessed   Lower Extremity Assessment Lower Extremity Assessment: Defer to PT evaluation   Cervical / Trunk Assessment Cervical / Trunk Assessment: Normal   Communication Communication Communication: No difficulties   Cognition Arousal/Alertness: Awake/alert Behavior During Therapy: Impulsive Overall Cognitive Status: Impaired/Different from baseline Area of Impairment: Orientation, Attention, Following commands, Memory, Safety/judgement, Awareness, Problem solving                   Current Attention Level: Focused   Following Commands: Follows one step commands with increased time Safety/Judgement: Decreased awareness of deficits, Decreased awareness of safety Awareness: Intellectual Problem Solving: Requires verbal cues, Slow processing       General Comments  VSS on RA    Exercises     Shoulder Instructions      Home Living Family/patient expects to be discharged to:: Private residence Living Arrangements: Alone Available Help at Discharge: Other (Comment) (no one) Type of Home: Apartment Home Access: Stairs to enter Entrance Stairs-Number of Steps: flight (17) Entrance Stairs-Rails: Left Home Layout: One level     Bathroom Shower/Tub: Producer, television/film/video: Standard Bathroom Accessibility: Yes   Home Equipment: None          Prior Functioning/Environment Prior Level of Function : Independent/Modified Independent             Mobility Comments: no AD use ADLs Comments: ind; does not drive uses transportation services        OT Problem List: Decreased strength;Decreased range of motion;Decreased activity tolerance      OT Treatment/Interventions: Self-care/ADL training;Therapeutic exercise;Energy conservation;DME and/or AE instruction;Therapeutic activities;Patient/family education;Balance training;Cognitive remediation/compensation    OT Goals(Current  goals can be found in the care plan section) Acute Rehab OT Goals Patient Stated Goal: none stated OT Goal Formulation: With patient Time For Goal Achievement: 01/15/22 Potential to Achieve Goals: Good  OT Frequency: Min 2X/week    Co-evaluation              AM-PAC OT "6 Clicks" Daily Activity     Outcome Measure Help from another person eating meals?: None Help from another person taking care of personal grooming?: A Little Help from another person toileting, which includes using toliet, bedpan, or urinal?: A Little Help from another person bathing (including washing, rinsing, drying)?: A Little Help from another person to put on and taking off regular upper body clothing?: A Little Help from another person to put on and taking off regular lower body clothing?: A Lot 6 Click Score: 18   End of Session Equipment Utilized During Treatment: Gait belt Nurse Communication: Mobility status  Activity Tolerance: Patient tolerated treatment well Patient left: in chair;with call bell/phone within reach;with chair alarm set (no cords to connect chair alarm box to wall, chair alarm on, RN aware)  OT Visit Diagnosis: Unsteadiness on feet (R26.81);Other abnormalities of gait and mobility (R26.89);Muscle weakness (generalized) (M62.81)                Time: 4540-9811 OT Time Calculation (min): 27 min Charges:  OT General Charges $OT Visit: 1 Visit OT Evaluation $OT Eval Moderate Complexity: 1 Mod OT Treatments $Self Care/Home Management : 8-22 mins  Alfonzo Beers, OTD, OTR/L Acute Rehab [phone]   Mayer Masker 01/01/2022, 12:24 PM

## 2022-01-01 NOTE — Progress Notes (Signed)
Patient says he does not use a BIPAP machine at home, and does not want to use our machine in the hospital. RT told pt to let us know if he changes his mind.

## 2022-01-01 NOTE — Progress Notes (Signed)
Advanced Heart Failure Rounding Note  PCP-Cardiologist: Dr. Zandra Abts   Subjective:    Respiratory status is improved.  He states that he is breathing about like he does when he is at home. .  Objective:   Weight Range: 106.7 kg Body mass index is 32.81 kg/m.   Vital Signs:   Temp:  [97.8 F (36.6 C)-98.5 F (36.9 C)] 97.8 F (36.6 C) (10/22 1018) Pulse Rate:  [72-84] 75 (10/22 1018) Resp:  [16-20] 16 (10/22 1018) BP: (115-139)/(78-88) 117/86 (10/22 1018) SpO2:  [94 %-99 %] 97 % (10/22 1018) Weight:  [106.7 kg] 106.7 kg (10/22 0457) Last BM Date : 12/30/21  Weight change: Filed Weights   12/30/21 0500 12/30/21 2245 01/01/22 0457  Weight: 105.7 kg 109.2 kg 106.7 kg    Intake/Output:   Intake/Output Summary (Last 24 hours) at 01/01/2022 1153 Last data filed at 01/01/2022 1019 Gross per 24 hour  Intake 240 ml  Output 2050 ml  Net -1810 ml     CVP 4   Physical Exam    GEN: Well nourished, well developed, in no acute distress  HEENT: normal  Neck: no JVD, carotid bruits, or masses Cardiac: RRR; no murmurs, rubs, or gallops,no edema  Respiratory:  clear to auscultation bilaterally, normal work of breathing GI: soft, nontender, nondistended, + BS MS: no deformity or atrophy  Skin: warm and dry Neuro:  Strength and sensation are intact Psych: euthymic mood, full affect    Telemetry  Sinus rhythm-personally reviewed  EKG    N/A   Labs    CBC Recent Labs    12/30/21 0331 12/31/21 0218  WBC 15.6* 9.9  HGB 13.0 13.0  HCT 37.6* 38.1*  MCV 93.3 94.5  PLT 199 [id number]    Basic Metabolic Panel Recent Labs    12/30/21 0331 12/31/21 0218 01/01/22 0145  NA 137 135 137  K 3.6 4.1 4.2  CL 100 104 103  CO2 24 23 27   GLUCOSE 95 87 102*  BUN 33* 29* 25*  CREATININE 1.62* 1.38* 1.50*  CALCIUM 8.7* 8.4* 9.3  MG 1.6*  --  2.1    Liver Function Tests No results for input(s): "AST", "ALT", "ALKPHOS", "BILITOT", "PROT", "ALBUMIN" in the last 72  hours.  No results for input(s): "LIPASE", "AMYLASE" in the last 72 hours. Cardiac Enzymes No results for input(s): "CKTOTAL", "CKMB", "CKMBINDEX", "TROPONINI" in the last 72 hours.  BNP: BNP (last 3 results) Recent Labs    12/29/21 0915  BNP 226.7*     ProBNP (last 3 results) No results for input(s): "PROBNP" in the last 8760 hours.   D-Dimer No results for input(s): "DDIMER" in the last 72 hours. Hemoglobin A1C No results for input(s): "HGBA1C" in the last 72 hours.  Fasting Lipid Panel No results for input(s): "CHOL", "HDL", "LDLCALC", "TRIG", "CHOLHDL", "LDLDIRECT" in the last 72 hours.  Thyroid Function Tests No results for input(s): "TSH", "T4TOTAL", "T3FREE", "THYROIDAB" in the last 72 hours.  Invalid input(s): "FREET3"   Other results:   Imaging    No results found.   Medications:     Scheduled Medications:  aspirin  81 mg Oral Daily   atorvastatin  80 mg Oral Daily   budesonide (PULMICORT) nebulizer solution  0.25 mg Nebulization BID   chlordiazePOXIDE  25 mg Oral TID   Followed by   chlordiazePOXIDE  25 mg Oral BH-qamhs   Followed by   Derrill Memo ON 01/02/2022] chlordiazePOXIDE  25 mg Oral Daily   clopidogrel  75 mg Oral Q breakfast   empagliflozin  10 mg Oral Daily   enoxaparin (LOVENOX) injection  40 mg Subcutaneous Q24H   ezetimibe  10 mg Oral Daily   famotidine  20 mg Oral Daily   metoprolol succinate  12.5 mg Oral Daily   multivitamin with minerals  1 tablet Oral Daily   nicotine  21 mg Transdermal Daily   pantoprazole  40 mg Oral Daily   rOPINIRole  2 mg Oral QHS   sodium chloride flush  10-40 mL Intracatheter Q12H   sodium chloride flush  3 mL Intravenous Q12H   spironolactone  25 mg Oral Daily   umeclidinium bromide  1 puff Inhalation Daily    Infusions:  sodium chloride Stopped (12/29/21 0842)   sodium chloride Stopped (12/29/21 1041)   sodium chloride Stopped (12/29/21 0838)    PRN Medications: sodium chloride, sodium  chloride, sodium chloride, acetaminophen, albuterol, chlordiazePOXIDE, guaiFENesin, hydrOXYzine, nitroGLYCERIN, ondansetron, sodium chloride flush, zolpidem    Patient Profile   66 y/o male w/ history of chronic systolic heart failure due to ICM, CAD s/p CABG in 2014 w/ subsequent recurrent MIs and stenting, HTN, HLD, tobacco abuse and ETOH abuse, admitted w/ acute STEMI c/b a/c CHF and hypoxic respiratory failure.   Assessment/Plan   1.  Coronary artery disease/acute STEMI: CABG in 2014 with subsequent PCI's.  Occluded circumflex stent.  No current chest pain.  2.  Acute on chronic systolic heart failure: Ejection fraction 30 to 35%.  Lung bases remain clear.  Creatinine has improved.  Holding diuresis for now.  3.  Acute hypoxic respiratory failure: Oxygen status has improved.  Plan per primary team.  4.  Acute renal failure: Received IV fluids post catheterization.  Suspect ATN.  Holding diuresis for now.  5.  Tobacco abuse: Complete cessation encouraged  6.  Alcohol abuse: Complete cessation encouraged   Length of Stay: 5  Mayre Bury Meredith Leeds, MD  01/01/2022, 11:53 AM  Advanced Heart Failure Team Pager [phone] (M-F; 7a - 5p)  Please contact [facility] for night-coverage after hours (5p -7a ) and weekends on [email or web address]

## 2022-01-02 ENCOUNTER — Other Ambulatory Visit (HOSPITAL_COMMUNITY): Payer: Self-pay

## 2022-01-02 ENCOUNTER — Telehealth (HOSPITAL_COMMUNITY): Payer: Self-pay | Admitting: Pharmacy Technician

## 2022-01-02 DIAGNOSIS — I5023 Acute on chronic systolic (congestive) heart failure: Secondary | ICD-10-CM | POA: Diagnosis not present

## 2022-01-02 DIAGNOSIS — I10 Essential (primary) hypertension: Secondary | ICD-10-CM | POA: Diagnosis not present

## 2022-01-02 DIAGNOSIS — N179 Acute kidney failure, unspecified: Secondary | ICD-10-CM | POA: Diagnosis not present

## 2022-01-02 DIAGNOSIS — I214 Non-ST elevation (NSTEMI) myocardial infarction: Secondary | ICD-10-CM | POA: Diagnosis not present

## 2022-01-02 LAB — BASIC METABOLIC PANEL
Anion gap: 12 (ref 5–15)
BUN: 22 mg/dL (ref 8–23)
CO2: 21 mmol/L — ABNORMAL LOW (ref 22–32)
Calcium: 9.2 mg/dL (ref 8.9–10.3)
Chloride: 105 mmol/L (ref 98–111)
Creatinine, Ser: 1.22 mg/dL (ref 0.61–1.24)
GFR, Estimated: >60 mL/min (ref >60)
Glucose, Bld: 89 mg/dL (ref 70–99)
Potassium: 4.5 mmol/L (ref 3.5–5.1)
Sodium: 138 mmol/L (ref 135–145)

## 2022-01-02 MED ORDER — SPIRONOLACTONE 25 MG PO TABS
25.0000 mg | ORAL_TABLET | Freq: Every day | ORAL | 6 refills | Status: DC
  Filled 2022-01-02: qty 30, 30d supply, fill #0

## 2022-01-02 MED ORDER — ASPIRIN 81 MG PO CHEW
81.0000 mg | CHEWABLE_TABLET | Freq: Every day | ORAL | 0 refills | Status: AC
  Filled 2022-01-02: qty 30, 30d supply, fill #0

## 2022-01-02 MED ORDER — SACUBITRIL-VALSARTAN 24-26 MG PO TABS
1.0000 | ORAL_TABLET | Freq: Two times a day (BID) | ORAL | Status: DC
  Administered 2022-01-02: 1 via ORAL
  Filled 2022-01-02: qty 1

## 2022-01-02 MED ORDER — ACETAMINOPHEN 500 MG PO TABS
500.0000 mg | ORAL_TABLET | Freq: Four times a day (QID) | ORAL | 0 refills | Status: DC | PRN
  Filled 2022-01-02: qty 30, 8d supply, fill #0

## 2022-01-02 MED ORDER — EMPAGLIFLOZIN 10 MG PO TABS
10.0000 mg | ORAL_TABLET | Freq: Every day | ORAL | 6 refills | Status: DC
  Filled 2022-01-02: qty 30, 30d supply, fill #0

## 2022-01-02 MED ORDER — METOPROLOL SUCCINATE ER 25 MG PO TB24
25.0000 mg | ORAL_TABLET | Freq: Every day | ORAL | Status: DC
  Administered 2022-01-02: 25 mg via ORAL
  Filled 2022-01-02: qty 1

## 2022-01-02 MED ORDER — SACUBITRIL-VALSARTAN 24-26 MG PO TABS
1.0000 | ORAL_TABLET | Freq: Two times a day (BID) | ORAL | 6 refills | Status: DC
  Filled 2022-01-02: qty 60, 30d supply, fill #0

## 2022-01-02 NOTE — Telephone Encounter (Signed)
Pharmacy Patient Advocate Encounter  Insurance verification completed.    The patient is insured through [employer] Part D   The patient is currently admitted and ran test claims for the following: Entresto.  Copays and coinsurance results were relayed to Inpatient clinical team.  

## 2022-01-02 NOTE — Discharge Instructions (Signed)
If your symptoms return, worsen, or persist please call your 62, report to local ER, or contact crisis hotline. Please do not drink alcohol or use any illegal substances while taking prescription medications.    EMERGENCY Suicide hotline: 988 Emergency: [place]: [place]. [place], [place] [address]. [phone]  Mobile Crisis Response Teams Listed by counties in vicinity of [place] [phone] [place] [phone] [place] [phone] [facility] [phone] [place] [phone]                * [place] [phone]  [place]. [phone] [place].  [place] [phone]  Dear Francee Piccolo,  Please make regular appointments with an outpatient psychiatrist and other doctors once you leave the hospital (if any, otherwise, please see below for resources to make an appointment).   To see which pharmacy near you is the CHEAPEST for certain medications, please use GoodRx. It is free website and has a free phone app.    Also consider looking at [facility] $4.00 or [facility]'s $7.00 prescription list. Both are free to view if googled "walmart $4 prescription" and "public's $7 prescription". These are set prices, no insurance required. [facility]'s low cost medications: $4-$15 for 30days prescriptions or $10-$38 for 90days prescriptions  For issues with sleep, please use this free app for insomnia called CBT-I. Let your doctors and therapists know so they can help with extra tips and tricks or for guidance and accountability. NO ADDS on the app.     Intensive Outpatient Programs for Substance Use  Treatment  [facility] [address],  [place] [address] [phone] phone  [facility] [employer]. [place], [place], [address] [phone] phone [phone] fax  [facility] [place], [place], [address] [phone] phone  [facility] (ADS) [place], [place], [address] [phone] phone  [facility] [address] [place], [place], [address] [phone] phone [phone] fax  [facility], [place] [address], [place], [address] [phone] phone  [employer] [facility] [phone] [address] [place], [place], [address] [phone] phone [phone] fax   Substance Abuse Resources  [facility] - Admissions are currently completed Monday through Friday at [facility]; both appointments and walk-ins are accepted.  Any individual that is a [facility] resident may present for a substance abuse screening and assessment for admission.  A person may be referred by numerous sources or self-refer.   Potential clients will be screened for medical necessity and appropriateness for the program.  Clients must meet criteria for high-intensity residential treatment services.  If clinically appropriate, a client will continue with the comprehensive clinical assessment and intake process, as well as enrollment in the [place].   Address: [address] [place], [place] [address] Admin Hours: Mon-Fri 8AM to [place] Hours: 24/7 Phone: [phone] Fax: [phone]   [facility] ([facility]) Facility Based Crisis:  These are 3 locations for services: Please call before arrival    Address: [address] [place], [place] [address] Phone: [phone]   Address: [address] [place], [place] [address] Phone#: [phone]   Address: [place], [place], [place]  [address] Phone#: [phone]     [facility]  (ADS): (offers outpatient therapy and intensive outpatient substance abuse therapy).  [address], [place], [place] [address] Phone: [phone]   [place]: Offers FREE recovery skills classes, support groups, 1:1 Peer Support, and Compeer Classes. [address], [place], [place] [address] Phone: [phone] (Call to complete intake).  [facility] [address] [place], [place] [address] Phone: [phone] ext: [facility] provides food, shelter and other programs and services to the homeless men of [place] through our [employer].   By offering safe shelter, three meals a day, clean clothing, Biblical counseling, financial planning, vocational training, GED/education and employment assistance, we've helped mend the shattered lives of many homeless men since opening in 1974.   We have approximately 267 beds available, with a max of 312 beds including mats for emergency situations and currently house an average of 270 men a night.   Prospective Client Check-In Information Photo ID Required (State/ Out of State/ [facility]) - if photo ID is not available, clients are required to have a printout of a police/sheriff's criminal history report. Help out with chores around the [place]. No sex offender of any type (pending, charged, registered and/or any other sex related offenses) will be permitted to check in. Must be willing to abide by all rules, regulations, and policies established by the [employer]. The following will be provided - shelter, food, clothing, and biblical counseling. If you or someone you know is in need of assistance at our [facility] shelter in [place], [place], please call [phone] ext. [phone].   [facility]-will provide timely access to mental health services for children and adolescents (4-17) and adults presenting in a mental health crisis. The program is  designed for those who need urgent Behavioral Health or Substance Use treatment and are not experiencing a medical crisis that would typically require an emergency room visit.    [place], [place] [address] Phone: [phone] [facility]: Phone#: [phone]   The [facility] SA Intensive Outpatient Program (SAIOP) means structured individual and group addiction activities and services that are provided at an outpatient program designed to assist adult and adolescent consumers to begin recovery and learn skills for recovery maintenance. The [place] program is offered at least 3 hours a day, 3 days a week.SAIOP services shall include a structured program consisting of, but not limited to, the following services: Individual counseling and support; Group counseling and support; Family counseling, training or support; Biochemical assays to identify recent drug use (e.g., urine drug screens); Strategies for relapse prevention to include community and social support systems in treatment; Life skills; Crisis contingency planning; Disease Management; and Treatment support activities that have been adapted or specifically designed for persons with physical disabilities, or persons with co-occurring disorders of mental illness and substance abuse/dependence or mental retardation/developmental disability and substance abuse/dependence. Phone: [place] [address] [place], [place] [address] Phone: [phone] Admissions team is available 24/7  Phone: [phone]. Fax: [phone]  [facility]     Admissions [address], [place], [place] [address] [phone] [facility] [place]: [phone]  Behavioral Health Crisis Line: [phone]

## 2022-01-02 NOTE — Progress Notes (Signed)
Asleep, Sp02 = 87-88%. Hooked back to [place] at 2 lpm. Patient was on room air when awake with Sp02 >93%. Plan of care ongoing.

## 2022-01-02 NOTE — Plan of Care (Signed)
  Problem: Education: Goal: Understanding of CV disease, CV risk reduction, and recovery process will improve Outcome: Adequate for Discharge Goal: Individualized Educational Video(s) Outcome: Adequate for Discharge   Problem: Activity: Goal: Ability to return to baseline activity level will improve Outcome: Adequate for Discharge   Problem: Cardiovascular: Goal: Ability to achieve and maintain adequate cardiovascular perfusion will improve Outcome: Adequate for Discharge Goal: Vascular access site(s) Level 0-1 will be maintained Outcome: Adequate for Discharge   Problem: Health Behavior/Discharge Planning: Goal: Ability to safely manage health-related needs after discharge will improve Outcome: Adequate for Discharge   Problem: Education: Goal: Understanding of cardiac disease, CV risk reduction, and recovery process will improve Outcome: Adequate for Discharge Goal: Individualized Educational Video(s) Outcome: Adequate for Discharge   Problem: Activity: Goal: Ability to tolerate increased activity will improve Outcome: Adequate for Discharge   Problem: Cardiac: Goal: Ability to achieve and maintain adequate cardiovascular perfusion will improve Outcome: Adequate for Discharge   Problem: Health Behavior/Discharge Planning: Goal: Ability to safely manage health-related needs after discharge will improve Outcome: Adequate for Discharge   Problem: Education: Goal: Knowledge of cardiac device and self-care will improve Outcome: Adequate for Discharge Goal: Ability to safely manage health related needs after discharge will improve Outcome: Adequate for Discharge Goal: Individualized Educational Video(s) Outcome: Adequate for Discharge   Problem: Cardiac: Goal: Ability to achieve and maintain adequate cardiopulmonary perfusion will improve Outcome: Adequate for Discharge   Problem: Education: Goal: Knowledge of General Education information will improve Description:  Including pain rating scale, medication(s)/side effects and non-pharmacologic comfort measures Outcome: Adequate for Discharge   Problem: Health Behavior/Discharge Planning: Goal: Ability to manage health-related needs will improve Outcome: Adequate for Discharge   Problem: Clinical Measurements: Goal: Ability to maintain clinical measurements within normal limits will improve Outcome: Adequate for Discharge Goal: Will remain free from infection Outcome: Adequate for Discharge Goal: Diagnostic test results will improve Outcome: Adequate for Discharge Goal: Respiratory complications will improve Outcome: Adequate for Discharge Goal: Cardiovascular complication will be avoided Outcome: Adequate for Discharge   Problem: Activity: Goal: Risk for activity intolerance will decrease Outcome: Adequate for Discharge   Problem: Nutrition: Goal: Adequate nutrition will be maintained Outcome: Adequate for Discharge   Problem: Coping: Goal: Level of anxiety will decrease Outcome: Adequate for Discharge   Problem: Elimination: Goal: Will not experience complications related to bowel motility Outcome: Adequate for Discharge Goal: Will not experience complications related to urinary retention Outcome: Adequate for Discharge   Problem: Pain Managment: Goal: General experience of comfort will improve Outcome: Adequate for Discharge   Problem: Safety: Goal: Ability to remain free from injury will improve Outcome: Adequate for Discharge   Problem: Skin Integrity: Goal: Risk for impaired skin integrity will decrease Outcome: Adequate for Discharge   Problem: Acute Rehab PT Goals(only PT should resolve) Goal: Pt Will Go Up/Down Stairs Outcome: Adequate for Discharge Goal: Pt/caregiver will Perform Home Exercise Program Outcome: Adequate for Discharge Goal: PT Additional Goal #1 Outcome: Adequate for Discharge Goal: PT Additional Goal #2 Outcome: Adequate for Discharge

## 2022-01-02 NOTE — Progress Notes (Signed)
Advanced Heart Failure Rounding Note  PCP-Cardiologist: Dr. Zandra Abts   Subjective:    Wants to go home. NO chest pain.  .  Objective:   Weight Range: 106.7 kg Body mass index is 32.81 kg/m.   Vital Signs:   Temp:  [97.8 F (36.6 C)-98.1 F (36.7 C)] 97.8 F (36.6 C) (10/23 0558) Pulse Rate:  [70-86] 86 (10/23 0748) Resp:  [16-18] 18 (10/23 0748) BP: (117-154)/(74-86) 154/85 (10/23 0558) SpO2:  [96 %-100 %] 96 % (10/23 0748) Weight:  [106.7 kg] 106.7 kg (10/23 0558) Last BM Date : 12/30/21  Weight change: Filed Weights   12/30/21 2245 01/01/22 0457 01/02/22 0558  Weight: 109.2 kg 106.7 kg 106.7 kg    Intake/Output:   Intake/Output Summary (Last 24 hours) at 01/02/2022 0839 Last data filed at 01/01/2022 2335 Gross per 24 hour  Intake 360 ml  Output 950 ml  Net -590 ml      Physical Exam    General:  Well appearing. No resp difficulty HEENT: normal Neck: supple. no JVD. Carotids 2+ bilat; no bruits. No lymphadenopathy or thryomegaly appreciated. Cor: PMI nondisplaced. Regular rate & rhythm. No rubs, gallops or murmurs. Lungs: clear Abdomen: soft, nontender, nondistended. No hepatosplenomegaly. No bruits or masses. Good bowel sounds. Extremities: no cyanosis, clubbing, rash, edema Neuro: alert & orientedx3, cranial nerves grossly intact. moves all 4 extremities w/o difficulty. Affect pleasant    Telemetry  SR 80-90s   EKG    N/A   Labs    CBC Recent Labs    12/31/21 0218  WBC 9.9  HGB 13.0  HCT 38.1*  MCV 94.5  PLT 858   Basic Metabolic Panel Recent Labs    01/01/22 0145 01/02/22 0547  NA 137 138  K 4.2 4.5  CL 103 105  CO2 27 21*  GLUCOSE 102* 89  BUN 25* 22  CREATININE 1.50* 1.22  CALCIUM 9.3 9.2  MG 2.1  --    Liver Function Tests No results for input(s): "AST", "ALT", "ALKPHOS", "BILITOT", "PROT", "ALBUMIN" in the last 72 hours.  No results for input(s): "LIPASE", "AMYLASE" in the last 72 hours. Cardiac Enzymes No  results for input(s): "CKTOTAL", "CKMB", "CKMBINDEX", "TROPONINI" in the last 72 hours.  BNP: BNP (last 3 results) Recent Labs    12/29/21 0915  BNP 226.7*    ProBNP (last 3 results) No results for input(s): "PROBNP" in the last 8760 hours.   D-Dimer No results for input(s): "DDIMER" in the last 72 hours. Hemoglobin A1C No results for input(s): "HGBA1C" in the last 72 hours.  Fasting Lipid Panel No results for input(s): "CHOL", "HDL", "LDLCALC", "TRIG", "CHOLHDL", "LDLDIRECT" in the last 72 hours.  Thyroid Function Tests No results for input(s): "TSH", "T4TOTAL", "T3FREE", "THYROIDAB" in the last 72 hours.  Invalid input(s): "FREET3"   Other results:   Imaging    No results found.   Medications:     Scheduled Medications:  aspirin  81 mg Oral Daily   atorvastatin  80 mg Oral Daily   [COMPLETED] chlordiazePOXIDE  25 mg Oral BH-qamhs   Followed by   chlordiazePOXIDE  25 mg Oral Daily   clopidogrel  75 mg Oral Q breakfast   empagliflozin  10 mg Oral Daily   enoxaparin (LOVENOX) injection  40 mg Subcutaneous Q24H   ezetimibe  10 mg Oral Daily   famotidine  20 mg Oral Daily   metoprolol succinate  25 mg Oral Daily   mometasone-formoterol  2 puff Inhalation BID  multivitamin with minerals  1 tablet Oral Daily   nicotine  21 mg Transdermal Daily   pantoprazole  40 mg Oral Daily   rOPINIRole  2 mg Oral QHS   sacubitril-valsartan  1 tablet Oral BID   sodium chloride flush  10-40 mL Intracatheter Q12H   sodium chloride flush  3 mL Intravenous Q12H   spironolactone  25 mg Oral Daily   umeclidinium bromide  1 puff Inhalation Daily    Infusions:  sodium chloride Stopped (12/29/21 0842)   sodium chloride Stopped (12/29/21 1041)   sodium chloride Stopped (12/29/21 0838)    PRN Medications: sodium chloride, sodium chloride, sodium chloride, acetaminophen, albuterol, chlordiazePOXIDE, guaiFENesin, hydrOXYzine, nitroGLYCERIN, ondansetron, sodium chloride flush,  zolpidem    Patient Profile   66 y/o male w/ history of chronic systolic heart failure due to ICM, CAD s/p CABG in 2014 w/ subsequent recurrent MIs and stenting, HTN, HLD, tobacco abuse and ETOH abuse, admitted w/ acute STEMI c/b a/c CHF and hypoxic respiratory failure.   Assessment/Plan   1. CAD/ Acute STEMI  - s/p CABG in 2014 and subsequent PCI's - culprit lesion totally occluded mid-distal Lcx, incomplete PTCA (failed attempt) s/p PTCA/cutting balloon within the stented segment,  Native Lcx beyond stent remains occluded - continue medical management ASA+Plavix, high intensity statin  - NO chest pain.   2. Acute on Chronic Systolic Heart Failure - ICM - Echo 1/22 EF 40%, RV mildly reduced - EF now 30-35%, RV mildly reduced post STEMI. No MR. LVEDP severely dilated on cath  Volume status stable.  -Jardiance 10  - Toprol xl 25 daily  -Entresto 24-26 mg twice a day  - Spironolactone 25 mg daily    3. Acute Hypoxic Respiratory Failure - Sats stable on nasal cannula.  - PCCM following, treating empirically for PNA w/ Rocephin.     4. AKI  - Resolved.  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 Tobacco Abuse - smoking cessation imperative   6. ETOH Abuse  - + withdrawal symptoms - CIWA protocol   OK for d/c   HF meds for d/c -ASA 81  daily  -Plavix 75 daily  -Atorvastatin 80 mg daily -Zetia 10 mg daily  Jardiance 10  - Toprol xl 25 daily  -Entresto 24-26 mg twice a day  - Spironolactone 25 mg daily   HF follow up set up. Asked to bring all medications to f/u appointments.  Length of Stay: [place], NP  01/02/2022, 8:39 AM  Advanced Heart Failure Team Pager [phone] (M-F; 7a - 5p)  Please contact [facility] for night-coverage after hours (5p -7a ) and weekends on [email or web address]
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
## 2022-01-02 NOTE — TOC Initial Note (Addendum)
Transition of Care [facility]) - Initial/Assessment Note    Patient Details  Name: Kevin Smith MRN: [id number] Date of Birth: [birth year]  [facility] of [facility]) CM/SW Contact:    Elliot Cousin, RN Phone Number: [phone] 01/02/2022, 10:00 AM  Clinical Narrative:                  HF TOC CM spoke to pt and offered choice for [facility] (medicare.gov list placed on chart and provided to pt). Pt requested four prong cane for home. Contacted [employer] for cane. Contacted [facility] rep, Kelly with new referral. Pt states he does not drive but has family that assist. Will discuss with pt [employer] transportation. Pt states he will pick up scale. Educated the importance of daily weights.   Patient uses [employer].     Expected Discharge Plan: Home w Home Health Services Barriers to Discharge: No Barriers Identified   Patient Goals and CMS Choice Patient states their goals for this hospitalization and ongoing recovery are:: to return back home CMS Medicare.gov Compare Post Acute Care list provided to:: Patient Choice offered to / list presented to : Patient  Expected Discharge Plan and Services Expected Discharge Plan: Home w Home Health Services In-house Referral: Clinical Social Work Discharge Planning Services: CM Consult   Living arrangements for the past 2 months: Single Family Home                 DME Arranged: Gilmer Mor DME Agency: [employer] Date DME Agency Contacted: 01/02/22 Time DME Agency Contacted: [phone] Representative spoke with at DME Agency: Lu[place] HH Arranged: RN, PT HH Agency: [facility] Date [facility] Agency Contacted: 01/02/22 Time HH Agency Contacted: 1000 Representative spoke with at [facility] Agency: Hassel Neth  Prior Living Arrangements/Services Living arrangements for the past 2 months: Single Family Home Lives with:: Self Patient language and need for interpreter reviewed:: Yes Do you feel safe going back to the place where you live?: Yes       Need for Family Participation in Patient Care: Yes (Comment) Care giver support system in place?: Yes (comment)   Criminal Activity/Legal Involvement Pertinent to Current Situation/Hospitalization: No - Comment as needed  Activities of Daily Living Home Assistive Devices/Equipment: None ADL Screening (condition at time of admission) Patient's cognitive ability adequate to safely complete daily activities?: Yes Is the patient deaf or have difficulty hearing?: No Does the patient have difficulty seeing, even when wearing glasses/contacts?: No Does the patient have difficulty concentrating, remembering, or making decisions?: No Patient able to express need for assistance with ADLs?: Yes Does the patient have difficulty dressing or bathing?: No Independently performs ADLs?: Yes (appropriate for developmental age) Does the patient have difficulty walking or climbing stairs?: No Weakness of Legs: None Weakness of Arms/Hands: None  Permission Sought/Granted Permission sought to share information with : Case Manager, Family Supports, PCP Permission granted to share information with : Yes, Verbal Permission Granted  Share Information with NAME: Tamera Punt     Permission granted to share info w Relationship: daughter  Permission granted to share info w Contact Information: Tamera Punt [phone]  Emotional Assessment Appearance:: Appears stated age Attitude/Demeanor/Rapport: Engaged Affect (typically observed): Pleasant Orientation: : Oriented to Self, Oriented to Place, Oriented to  Time, Oriented to Situation Alcohol / Substance Use: Not Applicable Psych Involvement: No (comment)  Admission diagnosis:  NSTEMI (non-ST elevated myocardial infarction) [facility]) [I21.4] Patient Active Problem List   Diagnosis Date Noted   Alcohol abuse 12/31/2021   Acute on chronic  systolic CHF (congestive heart failure) ([place]) 12/31/2021   Acute kidney injury superimposed on chronic kidney disease ([place])  12/31/2021   Class 1 obesity 12/31/2021   NSTEMI (non-ST elevated myocardial infarction) ([place]) 12/27/2021   Primary insomnia 11/09/2021   Restless legs 11/09/2021   Cigarette smoker 08/18/2021   Upper airway cough syndrome 08/18/2021   Cervical radiculopathy 03/21/2021   History of ST elevation myocardial infarction (STEMI) 12/22/2020   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 ([place]) 09/08/2020   Macrocytosis 03/31/2020   Gastroesophageal reflux disease 01/02/2020   Erectile dysfunction 01/02/2020   COPD with acute exacerbation ([place]) 09/15/2018   Coronary artery disease involving 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 ([place]) 03/12/2017   COPD GOLD 2  11/22/2015   Nicotine dependence, uncomplicated 60/45/4098   Aortic aneurysm ([place]) 2017   Essential hypertension 01/04/2015   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   Hyperlipidemia with target LDL less than 70 12/07/2012   PCP:  Loman Brooklyn, FNP Pharmacy:   [facility] [address] [place] [address] Phone: [phone] Fax: [phone]  [facility] [place], [place] [address] [place] [address] Phone: [phone] Fax: [place] [address] [place] [address] Phone: [phone] Fax: [phone]     Social Determinants of Health (SDOH) Interventions    Readmission Risk Interventions     No data to display

## 2022-01-02 NOTE — Consult Note (Signed)
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 from outpatient substance abuse services and treatment. -Did discuss treatment and safety options with patient's daughter, also updated resources and AVS under discharge instructions.  -Psychiatry consult service to sign off at this time. Disposition: No evidence of imminent risk to self or others at present.   Patient does not meet criteria for psychiatric inpatient admission. Supportive therapy provided about ongoing stressors. Refer to IOP. Discussed crisis plan, support from social network, calling 911, coming to the Emergency Department, and calling Suicide Hotline.  Maryagnes Amosakia S Starkes-Perry, FNP 01/02/2022 12:17 PM

## 2022-01-02 NOTE — Progress Notes (Addendum)
Occupational Therapy Treatment Patient Details Name: Kevin Smith MRN: [id number] DOB: 15-Nov-1955 Today's Date: 01/02/2022   History of present illness Pt is a 66 y/o M presenting to ED on 10/17 wtih chest discomfort and dyspnea. Admitted for NSTEMI, underwent PCI to circumflex arter 10/17, CIWA initiated 10/20. PMH includes CAD s/p CABG (2014), COPD, depression, GERD, HLD, HTN, arthirits, anxiety, asthma, and aortic aneurysm.   OT comments  Pt seen for OT session with focus on LB dressing and demo of AE, pt attempting to complete LB dressing upon arrival, getting increasingly frustrated that whomever packed him clothes "brought the wrong pants". Therapist offering to assist pt and educate on AE, however pt becoming increasingly agitated yelling out "I hate my life", "no one cares about me" and asking therapist for gun. When therapist asked pt stating he would want to speak with "a shrink". RN immediately notified and in room at end of session, MD notified via secure chat that pt with verbalizations related to self harm, and contacting psych. Pt presenting with impairments listed below, will follow acutely. Continue to recommend HHOT at d/c.   Recommendations for follow up therapy are one component of a multi-disciplinary discharge planning process, led by the attending physician.  Recommendations may be updated based on patient status, additional functional criteria and insurance authorization.    Follow Up Recommendations  Home health OT    Assistance Recommended at Discharge Set up Supervision/Assistance  Patient can return home with the following  A little help with walking and/or transfers;A little help with bathing/dressing/bathroom;Assistance with cooking/housework;Direct supervision/assist for medications management;Direct supervision/assist for financial management;Assist for transportation;Help with stairs or ramp for entrance   Equipment Recommendations  Other (comment) (RW)     Recommendations for Other Services Other (comment) (psych)    Precautions / Restrictions Precautions Precautions: Fall Precaution Comments: lightheaded/dizzy, reports a history of dizziness with head turns Restrictions Weight Bearing Restrictions: No       Mobility Bed Mobility Overal bed mobility: Needs Assistance Bed Mobility: Sit to Supine   Sidelying to sit: Supervision            Transfers Overall transfer level: Needs assistance Equipment used: None Transfers: Sit to/from Stand Sit to Stand: Min guard                 Balance Overall balance assessment: Needs assistance Sitting-balance support: No upper extremity supported, Feet supported Sitting balance-Leahy Scale: Good Sitting balance - Comments: seated EOB, can reach outside BOS without LOB   Standing balance support: No upper extremity supported, During functional activity Standing balance-Leahy Scale: Fair Standing balance comment: reaching for external supports with mobility                           ADL either performed or assessed with clinical judgement   ADL Overall ADL's : Needs assistance/impaired                     Lower Body Dressing: Minimal assistance;Sit to/from stand Lower Body Dressing Details (indicate cue type and reason): donning pants and undergarments             Functional mobility during ADLs: Minimal assistance;Rolling walker (2 wheels) General ADL Comments: pt with increasing agitation with LB dressing, attempting to demo AE, however pt with increasing agitation that "they  brought the wrong pants"    Extremity/Trunk Assessment Upper Extremity Assessment Upper Extremity Assessment: Overall WFL for tasks assessed   Lower  Extremity Assessment Lower Extremity Assessment: Defer to PT evaluation        Vision   Vision Assessment?: No apparent visual deficits   Perception Perception Perception: Not tested   Praxis Praxis Praxis: Not  tested    Cognition Arousal/Alertness: Awake/alert Behavior During Therapy: Impulsive Overall Cognitive Status: Impaired/Different from baseline Area of Impairment: Attention, Memory, Following commands, Safety/judgement, Awareness, Problem solving                   Current Attention Level: Focused Memory: Decreased short-term memory Following Commands: Follows one step commands with increased time Safety/Judgement: Decreased awareness of safety, Decreased awareness of deficits     General Comments: Pt with vocalizations regarding self harm during session, stating "I hate my life" and asking therapist for gun, RN immediately notified and in room at end of session. MD notified via securechat        Exercises      Shoulder Instructions       General Comments VSS on RA    Pertinent Vitals/ Pain       Pain Assessment Pain Assessment: No/denies pain  Home Living                                          Prior Functioning/Environment              Frequency  Min 2X/week        Progress Toward Goals  OT Goals(current goals can now be found in the care plan section)  Progress towards OT goals: Progressing toward goals  Acute Rehab OT Goals Patient Stated Goal: none stated OT Goal Formulation: With patient Time For Goal Achievement: 01/15/22 Potential to Achieve Goals: Good  Plan Discharge plan remains appropriate;Frequency remains appropriate    Co-evaluation                 AM-PAC OT "6 Clicks" Daily Activity     Outcome Measure   Help from another person eating meals?: None Help from another person taking care of personal grooming?: A Little Help from another person toileting, which includes using toliet, bedpan, or urinal?: A Little Help from another person bathing (including washing, rinsing, drying)?: A Little Help from another person to put on and taking off regular upper body clothing?: A Little Help from another  person to put on and taking off regular lower body clothing?: A Little 6 Click Score: 19    End of Session    OT Visit Diagnosis: Unsteadiness on feet (R26.81);Other abnormalities of gait and mobility (R26.89);Muscle weakness (generalized) (M62.81)   Activity Tolerance Patient tolerated treatment well   Patient Left in bed;with call bell/phone within reach;with nursing/sitter in room   Nurse Communication Mobility status        Time: 7591-6384 OT Time Calculation (min): 11 min  Charges: OT General Charges $OT Visit: 1 Visit OT Treatments $Self Care/Home Management : 8-22 mins  Lynnda Child, OTD, OTR/L Acute Rehab [phone] - [place] 01/02/2022, 10:00 AM

## 2022-01-02 NOTE — TOC Benefit Eligibility Note (Signed)
Patient Teacher, English as a foreign language completed.    The patient is currently admitted and upon discharge could be taking Entresto 24-26 mg.  The current 30 day co-pay is $45.00.   The patient is insured through [employer] [place] Patient Advocate Specialist [place] Patient Advocate Team Direct Number: [phone]  Fax: [phone]

## 2022-01-02 NOTE — Care Management Important Message (Signed)
Important Message  Patient Details  Name: [name] MRN: [id number] Date of Birth: [birth year]   Medicare Important Message Given:  Yes     Shelda Altes 01/02/2022, 11:02 AM

## 2022-01-02 NOTE — Progress Notes (Signed)
Ambulation deferred due to pt being off monitor, pt was educated on asa and  plavix use, smoking cessation, daily weights, low NA diet, taking all meds, and exercise. Pt was given HF book and smoking cessation sheet. Pt has been referred to [facility].   [id number] Kevin Smith 10:15 AM 01/02/2022

## 2022-01-02 NOTE — Discharge Summary (Signed)
Physician Discharge Summary   Patient: Kevin Smith MRN: [id number] DOB: February 02, 1956  Admit date:     12/27/2021  Discharge date: 01/02/22  Discharge Physician: Jimmy Picket Kevin Smith   PCP: Kevin Brooklyn, FNP   Recommendations at discharge:    Patient has been placed on dual antiplatelet therapy and high dose statin. Guide line directed medical therapy for heart failure Decision for loop diuretic as outpatient.  Follow up with Hendricks Limes FNP in 7 to 10 days Follow up with outpatient psychiatric support system.   Discharge Diagnoses: Principal Problem:   NSTEMI (non-ST elevated myocardial infarction) ([place]) Active Problems:   Acute on chronic systolic CHF (congestive heart failure) (HCC)   Essential hypertension   Acute kidney injury superimposed on chronic kidney disease (HCC)   COPD GOLD 2    Alcohol abuse   Hyperlipidemia with target LDL less than 70   Class 1 obesity  Resolved Problems:   * No resolved hospital problems. [facility] Course: [name] was admitted to the hospital with the working diagnosis of NSTEMI, complicated with acute on chronic systolic heart failure.  Hospitalization complicated with alcohol withdrawal and encephalopathy.   66 yo male with the past medical history of CAD sp CABG, hypertension, dyslipidemia, etho abuse, ascending aortic aneurysm, depression and COPD who presented with chest pain and dyspnea. Patient reported acute onset of chest pain, radiating to his arm and associated with dyspnea. Because persistent symptoms EMS was called, he had borderline inferior ST elevations in the setting or right bundle branch block (STEMI was not called due to bundle branch block). On his initial physical examination his blood pressure was 130/97, HR 72, RR 18 and 02 saturation 99%, lungs with diffuse wheezing and rhonchi, heart with S1 and S2 present and rhythmic, abdomen with no distention, no lower extremity edema.   Na 142, K 3,9 CL 102,  bicarbonate 24, glucose 95 bun 14 cr 1,49  High sensitive troponin 64 -162-996   Wbc 10,5 hgb 16,0 plt 223   EKG 71 bpm, normal axis, right bundle branch block, sinus rhythm with ST elevation lead II, III, AvF, with no significant T wave changes.   Chest radiograph with mild cardiomegaly, mild hilar vascular congestion, no effusions. Sternotomy wires in place.   10/17 Patient underwent cardiac catheterization and was found to have diffuse coronary artery disease, and underwent PCI to circumflex.  Noted high filling pressures on right heart catheterization and placed on furosemide for diuresis and milrinone for inotropic support.    10/18 transferred to ICU due to agitation and respiratory distress with hypoxemia. He was placed on non invasive mechanical ventilation.  Required phenobarbital in order to be liberated from Bipap.   10/20 Started on alcohol withdrawal protocol plus dexmedetomidine with improvement of his symptoms. Discontinued milrinone  10/21 transfer to [facility].   Taper chlordiazepoxide.   10/22 clinically stable, encephalopathy has improved.  10/23 patient expressed depression and suicidal thoughts, psychiatry was consulted.  Patient was deemed stable to be discharged home with outpatient follow up.  Follow up with cardiology as outpatient.   Assessment and Plan: * NSTEMI (non-ST elevated myocardial infarction) ([place]) Coronary artery disease with ischemic cardiomyopathy.  Sp CABG 2014   10/17 cardiac catheterization  Severe native CAD with total occlusion of LAD, 70% stenosis in high marginal ramus like vessel, 10% stenosis in the obtuse marginal vessel with total occlusion of the mid circumflex involving segmental with faint collateralization to distal marginal branch. Native RCA occluded in its mid  segment. Faint collateralization of the PDA via the left circulation.  Patent LIMA to LAD Ostial occlusion to SVG circumflex and PDA   SP PCI to mid circumflex, cutting  balloon within the entire stented segment.   Plan to continue with dual antiplatelet therapy with aspirin and clopidogrel indefinitely.  Continue with statin therapy.  Multidisciplinary discussion recommended medical therapy rather than further invasive intervention.   Acute on chronic systolic CHF (congestive heart failure) (HCC) Echocardiogram with reduced LV systolic function 30 to [id number], Akinesis of the LV basal to mid inferoseptal, basal to mid inferior, and basal to mid inferolateral walls. Moderate dilatation of LV internal cavity. Trivial pericardial effusion.   RV systolic function with mild reduction, severe dilatation of the ascending aorta, 49 mm.   Patient was placed on IV furosemide and inotropic support with milrinone, negative fluid balance was achieved, -6,602 ml with significant improvement in his symptoms.   Patient will continue medical therapy with entresto, empagliflozin, metoprolol and spironolactone.   Acute hypoxemic respiratory failure due to acute cardiogenic pulmonary edema. It has improved with diuresis  02 saturation is 97% on room air, no orthopnea.   Essential hypertension Continue blood pressure control with metoprolol and spironolactone. Added entresto with good toleration.   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  Hypomagnesemia   At the time of his discharge his renal function has improved with serum cr at 1,2 with K at 4,5 and serum bicarbonate at 21. Na 138   Continue spironolactone and SGLT 2 inh.  Follow up as outpatient, for further decision about loop diuretic therapy.   COPD GOLD 2  No clinical sings of exacerbation Continue with smoking cessation counseling Continue with bronchodilator therapy and inhaled corticosteroids.   Alcohol abuse Acute metabolic encephalopathy Acute alcohol withdrawal syndrome.   Patient had a prolonged hospitalization due to encephalopathy.  He was treated with  phenobarbital, dexmetomidine and chlordiazepoxide.   At the time of his discharge he is calm and cooperative He did express frustration and depression, he made a comment of suicidal thoughts. Psychiatry was consulted and he was deemed stable for discharge home and follow up as outpatient.   Hyperlipidemia with target LDL less than 70 Continue with statin and ezetimibe therapy.   Class 1 obesity Calculated BMI is 33,5          Consultants: cardiology, pulmonary critical care, psychiatry  Procedures performed: cardiac catheterization and PCI   Disposition: Home Diet recommendation:  Cardiac diet DISCHARGE MEDICATION: Allergies as of 01/02/2022       Reactions   Sulfa Antibiotics Rash        Medication List     STOP taking these medications    furosemide 20 MG tablet Commonly known as: LASIX   losartan 100 MG tablet Commonly known as: COZAAR       TAKE these medications    acetaminophen 500 MG tablet Commonly known as: TYLENOL Take 1 tablet (500 mg total) by mouth every 6 (six) hours as needed for mild pain. What changed: how much to take   albuterol (2.5 MG/3ML) 0.083% nebulizer solution Commonly known as: PROVENTIL Take 3 mLs (2.5 mg total) by nebulization every 4 (four) hours as needed for wheezing or shortness of breath. What changed: Another medication with the same name was changed. Make sure you understand how and when to take each.   Ventolin HFA 108 (90 Base) MCG/ACT inhaler Generic drug: albuterol INHALE 2 PUFFS BY MOUTH EVERY  6 HOURS AS NEEDED FOR WHEEZING AND FOR SHORTNESS OF BREATH What changed: See the new instructions.   aspirin 81 MG chewable tablet Chew 1 tablet (81 mg total) by mouth daily. Start taking on: January 03, 2022   atorvastatin 80 MG tablet Commonly known as: LIPITOR TAKE 1 TABLET BY MOUTH ONCE DAILY.  Please keep upcoming appointment in October for future refills. Thank you What changed:  how much to take how to take  this when to take this additional instructions   Breztri Aerosphere 160-9-4.8 MCG/ACT Aero Generic drug: Budeson-Glycopyrrol-Formoterol Inhale 2 puffs into the lungs 2 (two) times daily.   cetirizine 10 MG tablet Commonly known as: ZYRTEC Take 1 tablet by mouth once daily   clopidogrel 75 MG tablet Commonly known as: PLAVIX Take 1 tablet by mouth once daily   empagliflozin 10 MG Tabs tablet Commonly known as: JARDIANCE Take 1 tablet (10 mg total) by mouth daily. Start taking on: January 03, 2022   ezetimibe 10 MG tablet Commonly known as: ZETIA Take 1 tablet (10 mg total) by mouth daily.   famotidine 20 MG tablet Commonly known as: Pepcid One after supper What changed:  how much to take how to take this when to take this additional instructions   metoprolol succinate 25 MG 24 hr tablet Commonly known as: TOPROL-XL TAKE 1 TABLET BY MOUTH ONCE DAILY . APPOINTMENT REQUIRED FOR FUTURE REFILLS What changed: See the new instructions.   nitroGLYCERIN 0.4 MG SL tablet Commonly known as: NITROSTAT Place 1 tablet (0.4 mg total) under the tongue every 5 (five) minutes x 3 doses as needed for chest pain.   pantoprazole 40 MG tablet Commonly known as: PROTONIX Take 1 tablet by mouth once daily   rOPINIRole 1 MG tablet Commonly known as: REQUIP Take 2 tablets (2 mg total) by mouth at bedtime. What changed:  when to take this reasons to take this   sacubitril-valsartan 24-26 MG Commonly known as: ENTRESTO Take 1 tablet by mouth 2 (two) times daily.   spironolactone 25 MG tablet Commonly known as: ALDACTONE Take 1 tablet (25 mg total) by mouth daily. Start taking on: January 03, 2022   zolpidem 5 MG tablet Commonly known as: AMBIEN Take 1 tablet (5 mg total) by mouth at bedtime as needed for sleep.               Durable Medical Equipment  (From admission, onward)           Start     Ordered   01/02/22 0959  For home use only DME Cane  Once        Comments: Four prong cane   01/02/22 0958            Follow-up Information     [facility] Follow up on 01/10/2022.   Specialty: Cardiology Why: at 12:00 Contact information: [address] [id number] [place], [place] Follow up.   Specialty: Home Health Services Why: Home Health RN and Physical Therapy-agency will call with appt. Contact information: [address] [place]  [address] [phone]         [facility]. Call today.   Specialty: Behavioral Health Why: For psychiatric evaluation, Mood Stabilizer (Depakote) and therapy Contact information: [address] [place] [phone]        [facility] Follow up.   Why: For thorough psychiatric evaluation, formal diagnosis and consider starting Mood Stabilizer (Depakote). Contact information: [place] [address] [phone]                Discharge Exam: Filed Weights   12/30/21 2245 01/01/22 0457 01/02/22 0558  Weight: 109.2 kg 106.7 kg 106.7 kg   BP (!) 154/85 (BP Location: Right Arm)   Pulse 86   Temp 97.8 F (36.6 C) (Oral)   Resp 18   Ht 5\' 11"  (1.803 m)   Wt 106.7 kg   SpO2 96%   BMI 32.81 kg/m   Patient is feeling well, no dyspnea or chest pain  Neurology awake and alert ENT with no pallor Cardiovascular with S1 and S2 present and rhythmic with no gallops, rubs or murmurs Respiratory with no rales or wheezing Abdomen with no distention  No lower extremity edema   Condition at discharge: stable  The results of significant diagnostics from this hospitalization (including imaging, microbiology, ancillary and laboratory) are listed below for reference.   Imaging Studies: DG CHEST PORT 1 VIEW  Result Date:  12/28/2021 CLINICAL DATA:  Right central line placement. EXAM: PORTABLE CHEST 1 VIEW COMPARISON:  Chest radiograph earlier today. FINDINGS: New right internal jugular central venous catheter tip overlies the upper SVC. No pneumothorax. Costophrenic angles are not entirely included in the field of view. Prior median sternotomy. Stable cardiomegaly. Unchanged mediastinal contours. Underlying chronic lung disease with chronic bronchial thickening and areas of parenchymal scarring. No pleural effusion or acute airspace disease. Stable osseous structures. IMPRESSION: 1. New right internal jugular central venous catheter with tip overlying the upper SVC. No pneumothorax. 2. Stable cardiomegaly post CABG and chronic lung disease. Electronically Signed   By: Keith Rake M.D.   On: 12/28/2021 16:26   ECHOCARDIOGRAM COMPLETE  Result Date: 12/28/2021    ECHOCARDIOGRAM REPORT   Patient Name:   GWENDOLYN NISHI Date of Exam: 12/28/2021 Medical Rec #:  [id number]     Height:       71.0 in Accession #:    [id number]    Weight:       172.2 lb Date of Birth:  1955-03-25      BSA:          1.979 m Patient Age:    76 years      BP:           95/71 mmHg Patient Gender: M             HR:           81 bpm. Exam Location:  Inpatient Procedure: 2D Echo, Color Doppler and Cardiac Doppler Indications:    R07.9* Chest pain, unspecified  History:        Patient has prior history of Echocardiogram examinations, most                 recent 04/01/2020. Prior CABG, COPD; Risk Factors:Hypertension,                 Dyslipidemia and ETOH.  Sonographer:    Raquel Sarna Senior RDCS Referring Phys: [id number] [place]  1. Left ventricular ejection fraction, by estimation, is 30 to 35%. The left ventricle has moderately decreased function. The left ventricle demonstrates regional wall motion abnormalities with basal to mid inferoseptal, basal to mid inferior, and basal  to mid inferolateral akinesis. The left ventricular internal cavity  size was moderately dilated. Left ventricular diastolic parameters  are consistent with Grade I diastolic dysfunction (impaired relaxation).  2. Right ventricular systolic function is mildly reduced. The right ventricular size is normal. Tricuspid regurgitation signal is inadequate for assessing PA pressure.  3. Right atrial size was mildly dilated.  4. The mitral valve is normal in structure. No evidence of mitral valve regurgitation. No evidence of mitral stenosis.  5. The aortic valve is tricuspid. There is mild calcification of the aortic valve. Aortic valve regurgitation is mild. No aortic stenosis is present.  6. Aortic dilatation noted. There is severe dilatation of the ascending aorta, measuring 49 mm.  7. The inferior vena cava is normal in size with <50% respiratory variability, suggesting right atrial pressure of 8 mmHg.  8. Technically difficult study with poor images. FINDINGS  Left Ventricle: Left ventricular ejection fraction, by estimation, is 30 to 35%. The left ventricle has moderately decreased function. The left ventricle demonstrates regional wall motion abnormalities. The left ventricular internal cavity size was moderately dilated. There is no left ventricular hypertrophy. Left ventricular diastolic parameters are consistent with Grade I diastolic dysfunction (impaired relaxation). Right Ventricle: The right ventricular size is normal. Right vetricular wall thickness was not well visualized. Right ventricular systolic function is mildly reduced. Tricuspid regurgitation signal is inadequate for assessing PA pressure. Left Atrium: Left atrial size was normal in size. Right Atrium: Right atrial size was mildly dilated. Pericardium: Trivial pericardial effusion is present. Mitral Valve: The mitral valve is normal in structure. No evidence of mitral valve regurgitation. No evidence of mitral valve stenosis. Tricuspid Valve: The tricuspid valve is normal in structure. Tricuspid valve regurgitation is  trivial. Aortic Valve: The aortic valve is tricuspid. There is mild calcification of the aortic valve. Aortic valve regurgitation is mild. No aortic stenosis is present. Pulmonic Valve: The pulmonic valve was normal in structure. Pulmonic valve regurgitation is not visualized. Aorta: Aortic dilatation noted. There is severe dilatation of the ascending aorta, measuring 49 mm. Venous: The inferior vena cava is normal in size with less than 50% respiratory variability, suggesting right atrial pressure of 8 mmHg. IAS/Shunts: No atrial level shunt detected by color flow Doppler.  LEFT VENTRICLE PLAX 2D LVIDd:         6.80 cm   Diastology LVIDs:         5.50 cm   LV e' medial:    6.85 cm/s LV PW:         1.00 cm   LV E/e' medial:  7.6 LV IVS:        0.80 cm   LV e' lateral:   7.51 cm/s LVOT diam:     2.30 cm   LV E/e' lateral: 6.9 LV SV:         75 LV SV Index:   38 LVOT Area:     4.15 cm  RIGHT VENTRICLE RV S prime:     8.59 cm/s TAPSE (M-mode): 0.6 cm LEFT ATRIUM             Index        RIGHT ATRIUM           Index LA diam:        3.50 cm 1.77 cm/m   RA Area:     21.70 cm LA Vol (A2C):   60.2 ml 30.43 ml/m  RA Volume:   60.40 ml  30.53 ml/m LA Vol (A4C):   27.7 ml 14.00 ml/m LA Biplane Vol: 44.2 ml 22.34 ml/m  AORTIC VALVE LVOT Vmax:  111.00 cm/s LVOT Vmean:  81.000 cm/s LVOT VTI:    0.181 m  AORTA Ao Root diam: 4.20 cm Ao Asc diam:  4.90 cm MITRAL VALVE MV Area (PHT): 2.61 cm    SHUNTS MV Decel Time: 291 msec    Systemic VTI:  0.18 m MV E velocity: 52.00 cm/s  Systemic Diam: 2.30 cm MV A velocity: 69.80 cm/s MV E/A ratio:  0.74 Dalton McleanMD Electronically signed by Wilfred Lacy Signature Date/Time: 12/28/2021/4:20:42 PM    Final    DG Chest Port 1 View  Result Date: 12/28/2021 CLINICAL DATA:  Acute respiratory distress.  Shortness of breath. EXAM: PORTABLE CHEST 1 VIEW COMPARISON:  Radiographs 12/27/2021 and 04/01/2020.  CT 08/23/2021. FINDINGS: 0841 hours. The heart size and mediastinal contours  are stable status post median sternotomy and CABG. Stable chronic lung disease with paraseptal emphysema and scattered parenchymal scarring. No superimposed edema, airspace disease, pneumothorax or significant pleural effusion identified. The right costophrenic angle is incompletely visualized. Old rib fractures are present bilaterally. Telemetry leads overlie the chest. IMPRESSION: Stable chronic lung disease. No evidence of acute cardiopulmonary process. Electronically Signed   By: Carey Bullocks M.D.   On: 12/28/2021 08:49   CARDIAC CATHETERIZATION  Result Date: 12/27/2021   Ramus lesion is 70% stenosed.   1st Mrg lesion is 10% stenosed.   Prox Cx to Mid Cx lesion is 100% stenosed.   Dist Cx lesion is 100% stenosed.   Mid Cx to Dist Cx lesion is 100% stenosed.   Ost LAD to Prox LAD lesion is 100% stenosed.   Mid RCA to Dist RCA lesion is 100% stenosed.   Origin lesion is 100% stenosed.   Origin to Prox Graft lesion is 100% stenosed.   Post intervention, there is a 30% residual stenosis.   There is severe left ventricular systolic dysfunction. Severe native CAD with previously noted total occlusion of the ostial LAD, 70% stenosis in high marginal/ramus like vessel, 10% stenosis in the obtuse marginal vessel with total occlusion of the mid circumflex involving segmental  with faint collateralization to distal marginal branch.  Native RCA is previously noted to be occluded in its mid segment.  There is faint collateralization to the PDA via the left circulation. Patent LIMA graft supplying the mid LAD. Old ostial occlusion of the SVG which had supplied the circumflex marginal vessel. Ostial occlusion of the SVG  which had supplied the PDA which has multiple stents in place. Severe LV dysfunction with more prominent hypocontractility inferiorly and anterolaterally with EF estimated approximately 30%.  LVEDP was significantly elevated at 40 to 45%.  Patient received IV Lasix and during the procedure had urine  output of 1050 cc. Very difficult attempt at PCI of the totally occluded mid circumflex vessel with ultimate PTCA, and cutting balloon within the entire stented segment with reestablishment of antegrade flow down the large distal marginal vessel arising from the distal aspect of the stent.  However, the native circumflex beyond the distal aspect of the stent remained occluded. RECOMMENDATION: Continue DAPT with aspirin/Plavix indefinitely.  Plan 2D echo Doppler study in a.m.  The patient must discontinue tobacco use.  Aggressive lipid-lowering therapy with target LDL less than 70. Plan to optimize medical management.  If recurrent symptomatology consider possibly bringing the patient back to the lab in several days for noncompliant balloon dilatation in the stented segment with questionable attempt at opening of the very distal vessel.   DG Chest Portable 1 View  Result Date: 12/27/2021 CLINICAL DATA:  Chest pain and shortness of breath EXAM: PORTABLE CHEST 1 VIEW COMPARISON:  04/01/2020 FINDINGS: Chronic interstitial coarsening; there is airway thickening and emphysema by CT earlier this year. No acute infiltrate, pulmonary edema, or pleural effusion. Stable mild cardiomegaly. Prior CABG. Extensive artifact from EKG leads. IMPRESSION: No acute finding when compared to prior. Electronically Signed   By: Jorje Guild M.D.   On: 12/27/2021 05:36    Microbiology: Results for orders placed or performed during the hospital encounter of 12/27/21  MRSA Next Gen by PCR, Nasal     Status: None   Collection Time: 12/28/21  3:24 AM   Specimen: Nasal Mucosa; Nasal Swab  Result Value Ref Range Status   MRSA by PCR Next Gen NOT DETECTED NOT DETECTED Final    Comment: (NOTE) The GeneXpert MRSA Assay (FDA approved for NASAL specimens only), is one component of a comprehensive MRSA colonization surveillance program. It is not intended to diagnose MRSA infection nor to guide or monitor treatment for MRSA  infections. Test performance is not FDA approved in patients less than 63 years old. Performed at [facility], [place] [address], [place], [place] [address]     Labs: CBC: Recent Labs  Lab 12/27/21 0520 12/28/21 0015 12/29/21 0429 12/29/21 0434 12/30/21 0331 12/31/21 0218  WBC 10.5 8.5  --  17.2* 15.6* 9.9  HGB 16.0 14.0 13.9 14.5 13.0 13.0  HCT 47.4 40.0 41.0 42.3 37.6* 38.1*  MCV 96.7 92.0  --  92.4 93.3 94.5  PLT 223 133*  --  237 199 [id number]   Basic Metabolic Panel: Recent Labs  Lab 12/29/21 0434 12/30/21 0331 12/31/21 0218 01/01/22 0145 01/02/22 0547  NA 136 137 135 137 138  K 4.0 3.6 4.1 4.2 4.5  CL 99 100 104 103 105  CO2 23 24 23 27  21*  GLUCOSE 177* 95 87 102* 89  BUN 25* 33* 29* 25* 22  CREATININE 1.81* 1.62* 1.38* 1.50* 1.22  CALCIUM 9.2 8.7* 8.4* 9.3 9.2  MG  --  1.6*  --  2.1  --    Liver Function Tests: Recent Labs  Lab 12/28/21 2112  AST 93*  ALT 34  ALKPHOS 58  BILITOT 0.8  PROT 6.9  ALBUMIN 3.6   CBG: No results for input(s): "GLUCAP" in the last 168 hours.  Discharge time spent: greater than 30 minutes.  Signed: Tawni Millers, MD [facility] 01/02/2022

## 2022-01-03 ENCOUNTER — Telehealth: Payer: Self-pay | Admitting: *Deleted

## 2022-01-03 ENCOUNTER — Other Ambulatory Visit: Payer: Self-pay | Admitting: Family Medicine

## 2022-01-03 DIAGNOSIS — R059 Cough, unspecified: Secondary | ICD-10-CM

## 2022-01-03 DIAGNOSIS — J439 Emphysema, unspecified: Secondary | ICD-10-CM

## 2022-01-03 NOTE — Patient Outreach (Signed)
  Care Coordination [facility] Note Transition Care Management Unsuccessful Follow-up Telephone Call  Date of discharge and from where:  [id number] [facility]  Attempts:  1st Attempt  Reason for unsuccessful TCM follow-up call:  Left voice message  [place] Care Management [phone]

## 2022-01-03 NOTE — Patient Outreach (Signed)
  Care Coordination [facility] Note Transition Care Management Follow-up Telephone Call Date of discharge and from where: [facility] [phone] How have you been since you were released from the hospital? Doing better Any questions or concerns? Yes RN went over all appointments and times Items Reviewed: Did the pt receive and understand the discharge instructions provided? No  He hadn't looked at them Medications obtained and verified? Yes  Other? No  Any new allergies since your discharge? No  Dietary orders reviewed? Yes Low sodium heart healthy diet Do you have support at home? Yes   Home Care and Equipment/Supplies: Were home health services ordered? yes If so, what is the name of the agency? [facility]  Has the agency set up a time to come to the patient's home? No RN gave patient the number so he could contact them Were any new equipment or medical supplies ordered?  Yes: 4 prong cane What is the name of the medical supply agency? [facility] Were you able to get the supplies/equipment? yes Do you have any questions related to the use of the equipment or supplies? No  Functional Questionnaire: (I = Independent and D = Dependent) ADLs: I  Bathing/Dressing- I  Meal Prep- I  Eating- I  Maintaining continence- I  Transferring/Ambulation- I  Managing Meds- I  Follow up appointments reviewed:  PCP Hospital f/u appt confirmed? No   Specialist Hospital f/u appt confirmed? Yes  Bernerd Pho [phone] 2:00 PM Are transportation arrangements needed? n If their condition worsens, is the pt aware to call PCP or go to the Emergency Dept.? Yes Was the patient provided with contact information for the PCP's office or ED? Yes Was to pt encouraged to call back with questions or concerns? Yes  SDOH assessments and interventions completed:   Yes  Care Coordination Interventions Activated:  Yes   Care Coordination Interventions:  PCP follow up appointment requested   Encounter Outcome:  Pt. Visit  Completed    [facility] [phone]

## 2022-01-04 ENCOUNTER — Encounter: Payer: Self-pay | Admitting: Student

## 2022-01-04 ENCOUNTER — Ambulatory Visit: Payer: Medicare HMO | Attending: Cardiology | Admitting: Student

## 2022-01-04 VITALS — BP 118/78 | HR 76 | Ht 71.0 in | Wt 238.6 lb

## 2022-01-04 DIAGNOSIS — I25810 Atherosclerosis of coronary artery bypass graft(s) without angina pectoris: Secondary | ICD-10-CM | POA: Diagnosis not present

## 2022-01-04 DIAGNOSIS — N1831 Chronic kidney disease, stage 3a: Secondary | ICD-10-CM | POA: Diagnosis not present

## 2022-01-04 DIAGNOSIS — E785 Hyperlipidemia, unspecified: Secondary | ICD-10-CM

## 2022-01-04 DIAGNOSIS — I10 Essential (primary) hypertension: Secondary | ICD-10-CM

## 2022-01-04 DIAGNOSIS — I5020 Unspecified systolic (congestive) heart failure: Secondary | ICD-10-CM

## 2022-01-04 DIAGNOSIS — I7121 Aneurysm of the ascending aorta, without rupture: Secondary | ICD-10-CM

## 2022-01-04 DIAGNOSIS — F17210 Nicotine dependence, cigarettes, uncomplicated: Secondary | ICD-10-CM
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
## 2022-01-04 NOTE — Patient Instructions (Signed)
Medication Instructions:  Your physician recommends that you continue on your current medications as directed. Please refer to the Current Medication list given to you today.  You have been given forms or Entresto patient assistance.   *If you need a refill on your cardiac medications before your next appointment, please call your pharmacy*   Lab Work: NONE   If you have labs (blood work) drawn today and your tests are completely normal, you will receive your results only by: [place] (if you have MyChart) OR A paper copy in the mail If you have any lab test that is abnormal or we need to change your treatment, we will call you to review the results.   Testing/Procedures: NONE    Follow-Up: At [facility], you and your health needs are our priority.  As part of our continuing mission to provide you with exceptional heart care, we have created designated Provider Care Teams.  These Care Teams include your primary Cardiologist (physician) and Advanced Practice Providers (APPs -  Physician Assistants and Nurse Practitioners) who all work together to provide you with the care you need, when you need it.  We recommend signing up for the patient portal called "MyChart".  Sign up information is provided on this After Visit Summary.  MyChart is used to connect with patients for Virtual Visits (Telemedicine).  Patients are able to view lab/test results, encounter notes, upcoming appointments, etc.  Non-urgent messages can be sent to your provider as well.   To learn more about what you can do with MyChart, go to [email or web address].    Your next appointment:   6 -8 week(s)  The format for your next appointment:   In Person  Provider:   You may see Dr. Harl Bowie or one of the following Advanced Practice Providers on your designated Care Team:   Bernerd Pho, PA-C  Ermalinda Barrios, PA-C     Other Instructions Thank you for choosing [facility]!    Important Information About Sugar

## 2022-01-09 ENCOUNTER — Encounter: Payer: Self-pay | Admitting: Family Medicine

## 2022-01-09 ENCOUNTER — Ambulatory Visit (INDEPENDENT_AMBULATORY_CARE_PROVIDER_SITE_OTHER): Payer: Medicare HMO | Admitting: Family Medicine

## 2022-01-09 VITALS — BP 118/79 | HR 81 | Temp 97.1°F | Ht 71.0 in | Wt 237.8 lb

## 2022-01-09 DIAGNOSIS — F332 Major depressive disorder, recurrent severe without psychotic features: Secondary | ICD-10-CM

## 2022-01-09 DIAGNOSIS — Z09 Encounter for follow-up examination after completed treatment for conditions other than malignant neoplasm: Secondary | ICD-10-CM | POA: Diagnosis not present

## 2022-01-09 DIAGNOSIS — N179 Acute kidney failure, unspecified: Secondary | ICD-10-CM

## 2022-01-09 DIAGNOSIS — F419 Anxiety disorder, unspecified: Secondary | ICD-10-CM | POA: Diagnosis not present

## 2022-01-09 DIAGNOSIS — I214 Non-ST elevation (NSTEMI) myocardial infarction: Secondary | ICD-10-CM

## 2022-01-09 DIAGNOSIS — I5023 Acute on chronic systolic (congestive) heart failure: Secondary | ICD-10-CM

## 2022-01-09 MED ORDER — ATORVASTATIN CALCIUM 80 MG PO TABS: 80.0000 mg | ORAL_TABLET | Freq: Every day | ORAL | 3 refills | Status: DC
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
## 2022-01-09 NOTE — Patient Instructions (Signed)
STAT referral to Dr Nehemiah Settle placed.  He is here in [employer].  If you do not hear from him in 1 week for appointment, call our office and ask for Loma Sousa (she is the referral lady) I will reach out to your heart specialist to discuss your weight gain. Continue monitoring weights at home.

## 2022-01-10 ENCOUNTER — Encounter (HOSPITAL_COMMUNITY): Payer: Medicare HMO

## 2022-01-10 NOTE — Progress Notes (Signed)
Lmtcb.

## 2022-01-17 ENCOUNTER — Other Ambulatory Visit: Payer: Self-pay | Admitting: Nurse Practitioner

## 2022-01-17 ENCOUNTER — Telehealth: Payer: Self-pay | Admitting: Nurse Practitioner

## 2022-01-17 NOTE — Telephone Encounter (Signed)
What is the care givers number and when will she be available for this phone cal?

## 2022-01-17 NOTE — Telephone Encounter (Signed)
[place] (Nurse from [facility]) called stating that she saw patient today for PT Eval and wanted to speak with patients PCP regarding the visit.  Says patient is complaining of SOB, Dizziness, and chest pain with exertion. Says he is not taking Nitroglycerin and also is not taking Crestor 2x daily like he is supposed to because he can't afford it so he is only taking it 1x daily. Says patient lives upstairs in an apartment which requires him to climb 16 steps to get to his apartment.   Needs advise on what to do for patient and would like to speak with PCP about it.  Vitals:  HR: 79 BP: 113/78 O2: 92 Temp: 97.5  Unable to check weight due to patient not having a scale.

## 2022-01-17 NOTE — Progress Notes (Signed)
I called backed to phone number [phone] for DR Bertell Maria PT to discuss patients safety and medication administration compliance. Voice message is full and could not reach anyone. I will call back tomorrow during work hours.

## 2022-01-17 NOTE — Telephone Encounter (Signed)
Thanks [facility], will call them.

## 2022-01-17 NOTE — Telephone Encounter (Signed)
Called pt , he is calling us back with her #

## 2022-01-17 NOTE — Telephone Encounter (Signed)
   Phone # was documented in original message. [phone]

## 2022-01-23 ENCOUNTER — Encounter: Payer: Self-pay | Admitting: Nurse Practitioner

## 2022-01-23 ENCOUNTER — Telehealth (INDEPENDENT_AMBULATORY_CARE_PROVIDER_SITE_OTHER): Payer: Medicare HMO | Admitting: Licensed Clinical Social Worker

## 2022-01-23 ENCOUNTER — Telehealth: Payer: Self-pay

## 2022-01-23 ENCOUNTER — Ambulatory Visit (INDEPENDENT_AMBULATORY_CARE_PROVIDER_SITE_OTHER): Payer: Medicare HMO | Admitting: Nurse Practitioner

## 2022-01-23 VITALS — BP 98/70 | HR 78 | Temp 98.8°F | Ht 71.0 in | Wt 233.0 lb

## 2022-01-23 DIAGNOSIS — F419 Anxiety disorder, unspecified: Secondary | ICD-10-CM | POA: Insufficient documentation

## 2022-01-23 DIAGNOSIS — F341 Dysthymic disorder: Secondary | ICD-10-CM

## 2022-01-23 DIAGNOSIS — F332 Major depressive disorder, recurrent severe without psychotic features: Secondary | ICD-10-CM | POA: Diagnosis not present

## 2022-01-23 MED ORDER — DESVENLAFAXINE SUCCINATE ER 50 MG PO TB24: 50.0000 mg | ORAL_TABLET | Freq: Every day | ORAL | 3 refills | Status: DC

## 2022-01-23 NOTE — Assessment & Plan Note (Signed)
Anxiety not well controlled, completed GAD-7, education provided to patient. Pristiq refill sent to pharmacy. Follow up in 6 weeks.

## 2022-01-23 NOTE — Telephone Encounter (Signed)
Aware in office

## 2022-01-23 NOTE — Progress Notes (Addendum)
Acute Office Visit  Subjective:     Patient ID: Kevin Smith, male    DOB: 1955/09/04, 66 y.o.   MRN: [id number]  Chief Complaint  Patient presents with   Anxiety    Pt states this hasn't changed    Depression    Pt states this is better than it use to be    HPI Patient is in today for Depression, Follow-up  He  was last seen for this 2 weeks ago. Changes made at last visit include patient is not currently not on any medication   He reports poor compliance with treatment. He is having side effects. "I don't like the way medication made me feel"  He reports fair to poor tolerance of treatment. Current symptoms include: depressed mood, difficulty concentrating, feelings of worthlessness/guilt, hopelessness, and recurrent thoughts of death He feels he is Worse since last visit.     01/23/2022    2:01 PM 01/09/2022   11:55 AM 11/28/2021    1:27 PM  Depression screen PHQ 2/9  Decreased Interest 1 2 0  Down, Depressed, Hopeless 2 1 0  PHQ - 2 Score 3 3 0  Altered sleeping 3 3   Tired, decreased energy 3 3   Change in appetite 0 0   Feeling bad or failure about yourself  3 3   Trouble concentrating 1 1   Moving slowly or fidgety/restless 1 1   Suicidal thoughts 0 0   PHQ-9 Score 14 14   Difficult doing work/chores Somewhat difficult Somewhat difficult     Anxiety, Follow-up  He was last seen for anxiety 2 weeks ago. Changes made at last visit include Patient is not currently on any medication.   He reports poor compliance with treatment. He reports fair to poor tolerance of treatment. He is having side effects. "I don't like how this medication makes me feel"  He feels his anxiety is severe and Worse since last visit.  Symptoms: No chest pain No difficulty concentrating  No dizziness No fatigue  No feelings of losing control No insomnia  Yes irritable No palpitations  No panic attacks Yes racing thoughts  No shortness of breath No sweating  No tremors/shakes     GAD-7 Results    01/23/2022    2:01 PM 01/09/2022   11:55 AM 11/08/2021    3:31 PM  GAD-7 Generalized Anxiety Disorder Screening Tool  1. Feeling Nervous, Anxious, or on Edge 3 3 1   2. Not Being Able to Stop or Control Worrying 1 1 0  3. Worrying Too Much About Different Things 1 1 1   4. Trouble Relaxing 1 1 3   5. Being So Restless it's Hard To Sit Still 1 1 2   6. Becoming Easily Annoyed or Irritable 3 3 3   7. Feeling Afraid As If Something Awful Might Happen 2 2 1   Total GAD-7 Score 12 12 11   Difficulty At Work, Home, or Getting  Along With Others? Somewhat difficult Somewhat difficult Somewhat difficult    PHQ-9 Scores    01/23/2022    2:01 PM 01/09/2022   11:55 AM 11/28/2021    1:27 PM  PHQ9 SCORE ONLY  PHQ-9 Total Score 14 14 0     Review of Systems  Constitutional: Negative.   HENT: Negative.    Respiratory: Negative.    Cardiovascular: Negative.   Genitourinary: Negative.   Skin: Negative.  Negative for itching and rash.  Neurological: Negative.   Psychiatric/Behavioral:  Positive for depression. The patient is  nervous/anxious.   All other systems reviewed and are negative.       Objective:    BP 98/70   Pulse 78   Temp 98.8 F (37.1 C)   Ht 5\' 11"  (1.803 m)   Wt 233 lb (105.7 kg)   SpO2 96%   BMI 32.50 kg/m  Wt Readings from Last 3 Encounters:  01/23/22 233 lb (105.7 kg)  01/09/22 237 lb 12.8 oz (107.9 kg)  01/04/22 238 lb 9.6 oz (108.2 kg)      Physical Exam Vitals reviewed.  Constitutional:      Appearance: Normal appearance.  HENT:     Head: Normocephalic.     Right Ear: External ear normal.     Left Ear: External ear normal.     Nose: Nose normal.     Mouth/Throat:     Mouth: Mucous membranes are moist.     Pharynx: Oropharynx is clear.  Eyes:     Conjunctiva/sclera: Conjunctivae normal.  Cardiovascular:     Rate and Rhythm: Normal rate and regular rhythm.     Pulses: Normal pulses.     Heart sounds: Normal heart sounds.   Pulmonary:     Effort: Pulmonary effort is normal.     Breath sounds: Normal breath sounds.  Abdominal:     General: Bowel sounds are normal.  Skin:    General: Skin is warm.  Neurological:     General: No focal deficit present.     Mental Status: He is alert and oriented to person, place, and time.  Psychiatric:        Mood and Affect: Mood is anxious and depressed.        Behavior: Behavior normal.     No results found for any visits on 01/23/22.      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 Patient is in clinic today wishing he would have died and that his life is useless because he can't work or do anything and he is dizzy half the time. I Provided listening and support, STAT referral to psychiatry pending. Refilled Pristiq 50 mg tablet by mouth daily.. Completed PHQ-9.  Patient knows to follow-up in 6 weeks or  with worsening unresolved symptoms.        Relevant Medications   desvenlafaxine (PRISTIQ) 50 MG 24 hr tablet   Anxiety    Anxiety not well controlled, completed GAD-7, education provided to patient. Pristiq refill sent to pharmacy. Follow up in 6 weeks.       Relevant Medications   desvenlafaxine (PRISTIQ) 50 MG 24 hr tablet    Meds ordered this encounter  Medications   desvenlafaxine (PRISTIQ) 50 MG 24 hr tablet    Sig: Take 1 tablet (50 mg total) by mouth daily.    Dispense:  30 tablet    Refill:  3    Order Specific Question:   Supervising Provider    Answer:   Gwyndolyn Saxon [phone]    No follow-ups on file.  [[id number]], NP

## 2022-01-23 NOTE — Assessment & Plan Note (Addendum)
Depression not well controlled, patient is scheduled with Gwyndolyn Saxon ( license clinical social worker)  today at 3:00 pm, patient discontinued all his medication because he hates how it made him feel. Patient is in clinic today wishing he would have died and that his life is useless because he can't work or do anything and he is dizzy half the time. I Provided listening and support, STAT referral to psychiatry pending. Refilled Pristiq 50 mg tablet by mouth daily.. Completed PHQ-9.  Patient knows to follow-up in 6 weeks or  with worsening unresolved symptoms.

## 2022-01-23 NOTE — Telephone Encounter (Signed)
-----   Message from Raliegh Ip, DO sent at 01/10/2022  8:58 AM EDT ----- Regarding: fluid meds Please see below for Kevin Smith.  His weight was no different here than when he saw cardiology but if he saw an increase in weight on his scale from 1 day to another, he needs to call and reschedule that heart failure appt.  Looks like they plan on increasing his Entresto to twice daily and that should help with any fluid retention.  Ok to have him come get labs in 1 week here.  Please place order for BMP and cc to [place], PA-C.  If still gaining fluid, sounds like next step will be to add some lasix back.   ----- Message ----- From: Ellsworth Lennox, PA-C Sent: 01/10/2022   8:12 AM EDT To: Tonny Bollman, MD; Raliegh Ip, DO  Hi Ashly,   Yes, I would recommend restarting Lasix 20mg  daily and repeating a BMET in 1 week. He is already on Spironolactone which should help with K+ levels so would see what his K+ is before adding additional supplementation. I can order the repeat BMET if needed but know he lives in [place] so might be easier to have at [facility]. He was also only taking Entresto once daily instead of twice daily so going to BID dosing should help as well. He had a visit with the Advanced Heart Failure team scheduled for today but it appears he canceled that.   Thanks for reaching out!  Best,  [facility]   ----- Message ----- From: [place], DO Sent: 01/09/2022  12:13 PM EDT To: 01/11/2022, MD; Tonny Bollman, PA-C  Reported a 6lb weight gain at home but our scale is consistent with your last visit so no changes made.  In the event he does put on fluid, do you want me to restart some lasix?
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
## 2022-01-27 ENCOUNTER — Telehealth: Payer: Self-pay | Admitting: Nurse Practitioner

## 2022-01-27 NOTE — Telephone Encounter (Signed)
FYI

## 2022-01-30 ENCOUNTER — Other Ambulatory Visit: Payer: Self-pay | Admitting: Student

## 2022-02-01 ENCOUNTER — Encounter: Payer: Self-pay | Admitting: Internal Medicine

## 2022-02-01 ENCOUNTER — Ambulatory Visit (INDEPENDENT_AMBULATORY_CARE_PROVIDER_SITE_OTHER): Payer: Medicare HMO | Admitting: Internal Medicine

## 2022-02-01 VITALS — BP 134/78 | HR 90 | Temp 98.6°F | Ht 71.0 in | Wt 232.8 lb

## 2022-02-01 DIAGNOSIS — R0609 Other forms of dyspnea: Secondary | ICD-10-CM | POA: Diagnosis not present

## 2022-02-01 DIAGNOSIS — J449 Chronic obstructive pulmonary disease, unspecified: Secondary | ICD-10-CM | POA: Diagnosis not present

## 2022-02-01 DIAGNOSIS — F17210 Nicotine dependence, cigarettes, uncomplicated: Secondary | ICD-10-CM | POA: Diagnosis not present

## 2022-02-01 DIAGNOSIS — R058 Other specified cough: Secondary | ICD-10-CM | POA: Diagnosis not present

## 2022-02-01 DIAGNOSIS — I5022 Chronic systolic (congestive) heart failure: Secondary | ICD-10-CM

## 2022-02-01 MED ORDER — VALSARTAN 80 MG PO TABS: 80.0000 mg | ORAL_TABLET | Freq: Every day | ORAL | 11 refills | Status: DC

## 2022-02-01 NOTE — Patient Instructions (Addendum)
Pepcid ac 20 mg after supper nightly   Plan A = Automatic = Always=   breztri Take 2 puffs first thing in am and then another 2 puffs about 12 hours later.    Work on inhaler technique:  relax and gently blow all the way out then take a nice smooth full deep breath back in, triggering the inhaler at same time you start breathing in.  Hold breath in for at least  5 seconds if you can. Blow out breztri thru nose. Rinse and gargle with water when done.  If mouth or throat bother you at all,  try brushing teeth/gums/tongue with arm and hammer toothpaste/ make a slurry and gargle and spit out.       Plan B = Backup (to supplement plan A, not to replace it) Only use your albuterol inhaler as a rescue medication to be used if you can't catch your breath by resting or doing a relaxed purse lip breathing pattern.  - The less you use it, the better it will work when you need it. - Ok to use the inhaler up to 2 puffs  every 4 hours if you must but call for appointment if use goes up over your usual need - Don't leave home without it !!  (think of it like the spare tire for your car)   Plan C = Crisis (instead of Plan B but only if Plan B stops working) - only use your albuterol nebulizer if you first try Plan B and it fails to help > ok to use the nebulizer up to every 4 hours but if start needing it regularly call for immediate appointment   Ok to try albuterol 15 min before an activity (on alternating days inhaler vs neb vs nothing )  that you know would usually make you short of breath and see if it makes any difference and if makes none then don't take albuterol after activity unless you can't catch your breath as this means it's the resting that helps, not the albuterol.        The key is to stop smoking completely before smoking completely stops you!  Stop entresto   Valsartan 80 mg daily in place of entresto  For cough > mucinex DM 1200 mg every 12 hours   I strongly recommend you get the  blood work I ordered today   Be sure the nurse today reviews your medications to be sure they match up  with this list    Please schedule a follow up office visit in 2  weeks, sooner if needed  with all medications /inhalers/ solutions in hand so we can verify exactly what you are taking. This includes all medications from all doctors and over the counters
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
## 2022-02-02 ENCOUNTER — Encounter: Payer: Self-pay | Admitting: Internal Medicine

## 2022-02-02 NOTE — Assessment & Plan Note (Addendum)
Active smoker / onset of symptoms around 2006 - Spirometry 08/18/2021  FEV1 2.4 (63%)  Ratio 0.68 p am symb/spiriva though technique poor - Allergy screen 08/18/2021 >  Eos 0.1 /  IgE  382 - Labs ordered 08/18/2021  :     alpha one AT phenotype  MM  Level 194 - 08/18/2021   Walked on RA  x  2  lap(s) =  approx 500  ft  @ slow due to hip pace, stopped due to hip pain s sob with lowest 02 sats 97%    - 08/18/2021  After extensive coaching inhaler device,  effectiveness =    75% symb/spriva vs breztri - 02/01/2022  After extensive coaching inhaler device,  effectiveness =    75% (short ti) > continue breztri    Group D (now reclassified as E) in terms of symptom/risk and laba/lama/ICS  therefore appropriate rx at this point >>>  continue breztri and approp saba see avs for instructions unique to this ov   Cough is not typical of copd - see uacs

## 2022-02-02 NOTE — Assessment & Plan Note (Signed)
Onset ? 2006 with dx copd /gerd -   Eos 0.1 /  IgE  382> referred to allergy 10/12/2021 and added pepcid 20 mg q pm to am ppi > did not do either  -   02/01/2022 added h2 hs and rec trial off entresto as pt having   cough syncope   Upper airway cough syndrome (previously labeled PNDS),  is so named because it's frequently impossible to sort out how much is  CR/sinusitis with freq throat clearing (which can be related to primary GERD)   vs  causing  secondary (" extra esophageal")  GERD from wide swings in gastric pressure that occur with throat clearing, often  promoting self use of mint and menthol lozenges that reduce the lower esophageal sphincter tone and exacerbate the problem further in a cyclical fashion.   These are the same pts (now being labeled as having "irritable larynx syndrome" by some cough centers) who not infrequently have a history of having failed to tolerate ace inhibitors,  dry powder inhalers or biphosphonates or report having atypical/extraesophageal reflux symptoms that don't respond to standard doses of PPI  and are easily confused as having aecopd or asthma flares by even experienced allergists/ pulmonologists (myself included).    Rec as above and return in 2 weeks

## 2022-02-02 NOTE — Assessment & Plan Note (Signed)
Trial off entresto and on valsartan replacement at 80 mg daily (with support by Dr Jearld Pies) in setting of severe uacs with cough syncope  02/02/2022 >>>   The sacubitrilat component of entresto is not and ACEi but it does lead to higher levels of bradykinin (the culprit in ACEi related cough) because it reduces Neprilysin based clearance of bradykinin. The typical symptoms are dry daytime cough (9% per PI) or complaints of a new sensation of globus or excess PNDS.   Cough to point of syncope is indication to stop immediately and I rec baselien bnp/bmp and f/u in 2 weeks to repeat, call sooner if needed         Each maintenance medication was reviewed in detail including emphasizing most importantly the difference between maintenance and prns and under what circumstances the prns are to be triggered using an action plan format where appropriate.  Total time for H and P, chart review, counseling, reviewing hfa  device(s) and generating customized AVS unique to this office visit / same day charting = 34 min

## 2022-02-02 NOTE — Assessment & Plan Note (Signed)
Counseled re importance of smoking cessation but did not meet time criteria for separate billing   °

## 2022-02-06 ENCOUNTER — Other Ambulatory Visit: Payer: Self-pay | Admitting: Family Medicine

## 2022-02-06 ENCOUNTER — Other Ambulatory Visit (HOSPITAL_COMMUNITY): Payer: Self-pay

## 2022-02-06 DIAGNOSIS — F5101 Primary insomnia: Secondary | ICD-10-CM

## 2022-02-07 ENCOUNTER — Encounter (HOSPITAL_COMMUNITY): Payer: Medicare HMO

## 2022-02-07 ENCOUNTER — Telehealth: Payer: Self-pay | Admitting: Student

## 2022-02-07 NOTE — Telephone Encounter (Signed)
I will forward message to PharmD for advice.
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
## 2022-02-07 NOTE — Telephone Encounter (Signed)
He is likely in the donut hole so Jardiance copay has increased, copay should return back to normal on January 1.  Pt can try filling out patient assistance application to see if he qualifies for free Jardiance from the manufacturer. [email or web address]

## 2022-02-07 NOTE — Telephone Encounter (Signed)
Pt c/o medication issue:  1. Name of Medication:   empagliflozin (JARDIANCE) 10 MG TABS tablet   2. How are you currently taking this medication (dosage and times per day)?  Not taking  3. Are you having a reaction (difficulty breathing--STAT)?   No  4. What is your medication issue?   Caller stated patient is no longer able to afford this medication and has not taken this medication for a week.  Caller stated patient should be called back directly on next steps.

## 2022-02-08 ENCOUNTER — Telehealth: Payer: Self-pay | Admitting: Internal Medicine

## 2022-02-08 NOTE — Telephone Encounter (Signed)
Call pt. No answer. Left msg to call back  

## 2022-02-08 NOTE — Telephone Encounter (Signed)
Called and notified Tabitha of response. Nothing further needed

## 2022-02-08 NOTE — Telephone Encounter (Signed)
Kevin Smith with [facility] [phone]  Limmie Patricia I11 minutes ago (3:46 PM)   Wyatt Mage asking if the labs Dr. Sherene Sires requested could be drawn when she sees him Friday.  Pt didn't have done at OV.  Please advise.  Incoming call   Dr. Sherene Sires please advise are you ok with this

## 2022-02-10 DIAGNOSIS — R0609 Other forms of dyspnea: Secondary | ICD-10-CM | POA: Diagnosis not present

## 2022-02-11 LAB — BASIC METABOLIC PANEL
BUN/Creatinine Ratio: 10 (ref 10–24)
BUN: 13 mg/dL (ref 8–27)
CO2: 12 mmol/L — ABNORMAL LOW (ref 20–29)
Calcium: 9.7 mg/dL (ref 8.6–10.2)
Chloride: 102 mmol/L (ref 96–106)
Creatinine, Ser: 1.26 mg/dL (ref 0.76–1.27)
Sodium: 140 mmol/L (ref 134–144)
eGFR: 63 mL/min/{1.73_m2} (ref >59)

## 2022-02-11 LAB — CBC WITH DIFFERENTIAL/PLATELET
Basophils Absolute: 0.1 10*3/uL (ref 0.0–0.2)
Basos: 1 %
EOS (ABSOLUTE): 0.5 10*3/uL — ABNORMAL HIGH (ref 0.0–0.4)
Eos: 6 %
Hematocrit: 46.8 % (ref 37.5–51.0)
Hemoglobin: 16.1 g/dL (ref 13.0–17.7)
Immature Grans (Abs): 0 10*3/uL (ref 0.0–0.1)
Immature Granulocytes: 0 %
Lymphocytes Absolute: 2.6 10*3/uL (ref 0.7–3.1)
Lymphs: 30 %
MCH: 31.5 pg (ref 26.6–33.0)
MCHC: 34.4 g/dL (ref 31.5–35.7)
MCV: 92 fL (ref 79–97)
Monocytes Absolute: 0.6 10*3/uL (ref 0.1–0.9)
Monocytes: 6 %
Neutrophils Absolute: 5 10*3/uL (ref 1.4–7.0)
Neutrophils: 57 %
Platelets: 243 10*3/uL (ref 150–450)
RBC: 5.11 x10E6/uL (ref 4.14–5.80)
RDW: 12.6 % (ref 11.6–15.4)
WBC: 8.7 10*3/uL (ref 3.4–10.8)

## 2022-02-11 LAB — BRAIN NATRIURETIC PEPTIDE

## 2022-02-13 ENCOUNTER — Telehealth: Payer: Self-pay | Admitting: Nurse Practitioner

## 2022-02-13 NOTE — Telephone Encounter (Signed)
Calling for new OT therapy orders for 12/10

## 2022-02-15 ENCOUNTER — Ambulatory Visit: Payer: Medicare HMO | Admitting: Internal Medicine

## 2022-02-15 NOTE — Telephone Encounter (Signed)
Returned call to pt. No answer. Left msg to call back.  
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
## 2022-02-17 ENCOUNTER — Encounter (INDEPENDENT_AMBULATORY_CARE_PROVIDER_SITE_OTHER): Payer: Medicare HMO | Admitting: Internal Medicine

## 2022-02-17 NOTE — Progress Notes (Signed)
Erroneous encounter - please disregard.

## 2022-02-17 NOTE — Telephone Encounter (Signed)
Returned call to patient. No answer, left msg to call back.

## 2022-02-20 ENCOUNTER — Telehealth: Payer: Medicare HMO | Admitting: Nurse Practitioner

## 2022-02-20 ENCOUNTER — Other Ambulatory Visit: Payer: Self-pay | Admitting: Family Medicine

## 2022-02-22 ENCOUNTER — Encounter: Payer: Self-pay | Admitting: Internal Medicine

## 2022-03-07 ENCOUNTER — Other Ambulatory Visit: Payer: Self-pay

## 2022-03-07 ENCOUNTER — Encounter (HOSPITAL_COMMUNITY): Payer: Self-pay | Admitting: Emergency Medicine

## 2022-03-07 ENCOUNTER — Emergency Department (HOSPITAL_COMMUNITY): Payer: Medicare HMO

## 2022-03-07 ENCOUNTER — Inpatient Hospital Stay (HOSPITAL_COMMUNITY): Payer: Medicare HMO

## 2022-03-07 ENCOUNTER — Inpatient Hospital Stay (HOSPITAL_COMMUNITY)
Admission: EM | Admit: 2022-03-07 | Discharge: 2022-03-09 | DRG: 308 | Disposition: A | Discharge status: Home Health | Payer: Medicare HMO | Attending: Family Medicine | Admitting: Family Medicine

## 2022-03-07 DIAGNOSIS — Z951 Presence of aortocoronary bypass graft: Secondary | ICD-10-CM | POA: Diagnosis not present

## 2022-03-07 DIAGNOSIS — I5022 Chronic systolic (congestive) heart failure: Secondary | ICD-10-CM | POA: Diagnosis present

## 2022-03-07 DIAGNOSIS — J101 Influenza due to other identified influenza virus with other respiratory manifestations: Secondary | ICD-10-CM | POA: Diagnosis not present

## 2022-03-07 DIAGNOSIS — Z1152 Encounter for screening for COVID-19: Secondary | ICD-10-CM

## 2022-03-07 DIAGNOSIS — I4891 Unspecified atrial fibrillation: Secondary | ICD-10-CM

## 2022-03-07 DIAGNOSIS — Z79899 Other long term (current) drug therapy: Secondary | ICD-10-CM

## 2022-03-07 DIAGNOSIS — I110 Hypertensive heart disease with heart failure: Secondary | ICD-10-CM | POA: Diagnosis not present

## 2022-03-07 DIAGNOSIS — I509 Heart failure, unspecified: Secondary | ICD-10-CM | POA: Diagnosis not present

## 2022-03-07 DIAGNOSIS — J441 Chronic obstructive pulmonary disease with (acute) exacerbation: Secondary | ICD-10-CM | POA: Diagnosis present

## 2022-03-07 DIAGNOSIS — R7989 Other specified abnormal findings of blood chemistry: Secondary | ICD-10-CM | POA: Insufficient documentation

## 2022-03-07 DIAGNOSIS — Z825 Family history of asthma and other chronic lower respiratory diseases: Secondary | ICD-10-CM

## 2022-03-07 DIAGNOSIS — R079 Chest pain, unspecified: Secondary | ICD-10-CM | POA: Diagnosis not present

## 2022-03-07 DIAGNOSIS — F1010 Alcohol abuse, uncomplicated: Secondary | ICD-10-CM | POA: Diagnosis present

## 2022-03-07 DIAGNOSIS — I480 Paroxysmal atrial fibrillation: Principal | ICD-10-CM | POA: Diagnosis present

## 2022-03-07 DIAGNOSIS — I4892 Unspecified atrial flutter: Secondary | ICD-10-CM | POA: Diagnosis present

## 2022-03-07 DIAGNOSIS — Z833 Family history of diabetes mellitus: Secondary | ICD-10-CM | POA: Diagnosis not present

## 2022-03-07 DIAGNOSIS — Z823 Family history of stroke: Secondary | ICD-10-CM

## 2022-03-07 DIAGNOSIS — Z8249 Family history of ischemic heart disease and other diseases of the circulatory system: Secondary | ICD-10-CM

## 2022-03-07 DIAGNOSIS — Z882 Allergy status to sulfonamides status: Secondary | ICD-10-CM

## 2022-03-07 DIAGNOSIS — Z28310 Unvaccinated for covid-19: Secondary | ICD-10-CM | POA: Diagnosis not present

## 2022-03-07 DIAGNOSIS — J439 Emphysema, unspecified: Secondary | ICD-10-CM

## 2022-03-07 DIAGNOSIS — E785 Hyperlipidemia, unspecified: Secondary | ICD-10-CM | POA: Diagnosis not present

## 2022-03-07 DIAGNOSIS — I252 Old myocardial infarction: Secondary | ICD-10-CM | POA: Diagnosis not present

## 2022-03-07 DIAGNOSIS — I255 Ischemic cardiomyopathy: Secondary | ICD-10-CM | POA: Diagnosis present

## 2022-03-07 DIAGNOSIS — I25709 Atherosclerosis of coronary artery bypass graft(s), unspecified, with unspecified angina pectoris: Secondary | ICD-10-CM | POA: Diagnosis not present

## 2022-03-07 DIAGNOSIS — Z981 Arthrodesis status: Secondary | ICD-10-CM

## 2022-03-07 DIAGNOSIS — Z7902 Long term (current) use of antithrombotics/antiplatelets: Secondary | ICD-10-CM

## 2022-03-07 DIAGNOSIS — Z5320 Procedure and treatment not carried out because of patient's decision for unspecified reasons: Secondary | ICD-10-CM | POA: Diagnosis present

## 2022-03-07 DIAGNOSIS — K219 Gastro-esophageal reflux disease without esophagitis: Secondary | ICD-10-CM | POA: Diagnosis present

## 2022-03-07 DIAGNOSIS — Z955 Presence of coronary angioplasty implant and graft: Secondary | ICD-10-CM

## 2022-03-07 DIAGNOSIS — Z7984 Long term (current) use of oral hypoglycemic drugs: Secondary | ICD-10-CM

## 2022-03-07 DIAGNOSIS — I10 Essential (primary) hypertension: Secondary | ICD-10-CM | POA: Diagnosis present

## 2022-03-07 DIAGNOSIS — F17210 Nicotine dependence, cigarettes, uncomplicated: Secondary | ICD-10-CM | POA: Diagnosis present

## 2022-03-07 DIAGNOSIS — Z841 Family history of disorders of kidney and ureter: Secondary | ICD-10-CM

## 2022-03-07 DIAGNOSIS — H9191 Unspecified hearing loss, right ear: Secondary | ICD-10-CM | POA: Diagnosis present

## 2022-03-07 DIAGNOSIS — J811 Chronic pulmonary edema: Secondary | ICD-10-CM | POA: Diagnosis not present

## 2022-03-07 DIAGNOSIS — R0602 Shortness of breath: Secondary | ICD-10-CM | POA: Diagnosis not present

## 2022-03-07 DIAGNOSIS — I2489 Other forms of acute ischemic heart disease: Secondary | ICD-10-CM | POA: Diagnosis not present

## 2022-03-07 DIAGNOSIS — K2100 Gastro-esophageal reflux disease with esophagitis, without bleeding: Secondary | ICD-10-CM | POA: Diagnosis not present

## 2022-03-07 DIAGNOSIS — G2581 Restless legs syndrome: Secondary | ICD-10-CM | POA: Diagnosis not present

## 2022-03-07 DIAGNOSIS — J9601 Acute respiratory failure with hypoxia: Secondary | ICD-10-CM | POA: Diagnosis not present

## 2022-03-07 DIAGNOSIS — R0789 Other chest pain: Secondary | ICD-10-CM | POA: Diagnosis not present

## 2022-03-07 DIAGNOSIS — G4700 Insomnia, unspecified: Secondary | ICD-10-CM | POA: Diagnosis present

## 2022-03-07 DIAGNOSIS — F32A Depression, unspecified: Secondary | ICD-10-CM | POA: Diagnosis present

## 2022-03-07 DIAGNOSIS — R531 Weakness: Secondary | ICD-10-CM | POA: Diagnosis present

## 2022-03-07 DIAGNOSIS — F419 Anxiety disorder, unspecified: Secondary | ICD-10-CM | POA: Diagnosis present

## 2022-03-07 DIAGNOSIS — R069 Unspecified abnormalities of breathing: Secondary | ICD-10-CM | POA: Diagnosis not present

## 2022-03-07 LAB — ECHOCARDIOGRAM COMPLETE
AR max vel: 2.8 cm2
AV Area VTI: 2.53 cm2
AV Area mean vel: 2.89 cm2
AV Mean grad: 3 mmHg
AV Peak grad: 6.8 mmHg
Ao pk vel: 1.3 m/s
Area-P 1/2: 3.6 cm2
Height: 71.5 in
MV VTI: 3.87 cm2
P 1/2 time: 298 msec
S' Lateral: 5.6 cm
Weight: 3947.12 oz

## 2022-03-07 LAB — CBC WITH DIFFERENTIAL/PLATELET
Abs Immature Granulocytes: 0.03 10*3/uL (ref 0.00–0.07)
Abs Immature Granulocytes: 0.05 10*3/uL (ref 0.00–0.07)
Basophils Absolute: 0 10*3/uL (ref 0.0–0.1)
Basophils Absolute: 0 10*3/uL (ref 0.0–0.1)
Basophils Relative: 0 %
Basophils Relative: 0 %
Eosinophils Absolute: 0.3 10*3/uL (ref 0.0–0.5)
Eosinophils Absolute: 0 10*3/uL (ref 0.0–0.5)
Eosinophils Relative: 3 %
Eosinophils Relative: 0 %
HCT: 42.2 % (ref 39.0–52.0)
HCT: 42.5 % (ref 39.0–52.0)
Hemoglobin: 14.2 g/dL (ref 13.0–17.0)
Hemoglobin: 14.3 g/dL (ref 13.0–17.0)
Immature Granulocytes: 0 %
Immature Granulocytes: 1 %
Lymphocytes Relative: 8 %
Lymphocytes Relative: 3 %
Lymphs Abs: 0.8 10*3/uL (ref 0.7–4.0)
Lymphs Abs: 0.4 10*3/uL — ABNORMAL LOW (ref 0.7–4.0)
MCH: 31.7 pg (ref 26.0–34.0)
MCH: 31.7 pg (ref 26.0–34.0)
MCHC: 33.6 g/dL (ref 30.0–36.0)
MCHC: 33.6 g/dL (ref 30.0–36.0)
MCV: 94.2 fL (ref 80.0–100.0)
MCV: 94.2 fL (ref 80.0–100.0)
Monocytes Absolute: 0.5 10*3/uL (ref 0.1–1.0)
Monocytes Absolute: 0.2 10*3/uL (ref 0.1–1.0)
Monocytes Relative: 5 %
Monocytes Relative: 2 %
Neutro Abs: 8.9 10*3/uL — ABNORMAL HIGH (ref 1.7–7.7)
Neutro Abs: 10.3 10*3/uL — ABNORMAL HIGH (ref 1.7–7.7)
Neutrophils Relative %: 84 %
Neutrophils Relative %: 94 %
Platelets: 181 10*3/uL (ref 150–400)
Platelets: 186 10*3/uL (ref 150–400)
RBC: 4.48 MIL/uL (ref 4.22–5.81)
RBC: 4.51 MIL/uL (ref 4.22–5.81)
RDW: 14.4 % (ref 11.5–15.5)
RDW: 14.3 % (ref 11.5–15.5)
WBC: 10.5 10*3/uL (ref 4.0–10.5)
WBC: 10.9 10*3/uL — ABNORMAL HIGH (ref 4.0–10.5)
nRBC: 0 % (ref 0.0–0.2)
nRBC: 0 % (ref 0.0–0.2)

## 2022-03-07 LAB — COMPREHENSIVE METABOLIC PANEL
ALT: 25 U/L (ref 0–44)
AST: 27 U/L (ref 15–41)
Albumin: 3.7 g/dL (ref 3.5–5.0)
Alkaline Phosphatase: 64 U/L (ref 38–126)
Anion gap: 9 (ref 5–15)
BUN: 16 mg/dL (ref 8–23)
CO2: 23 mmol/L (ref 22–32)
Calcium: 8.5 mg/dL — ABNORMAL LOW (ref 8.9–10.3)
Chloride: 103 mmol/L (ref 98–111)
Creatinine, Ser: 1.11 mg/dL (ref 0.61–1.24)
GFR, Estimated: >60 mL/min (ref >60)
Glucose, Bld: 132 mg/dL — ABNORMAL HIGH (ref 70–99)
Potassium: 4 mmol/L (ref 3.5–5.1)
Sodium: 135 mmol/L (ref 135–145)
Total Bilirubin: 0.7 mg/dL (ref 0.3–1.2)
Total Protein: 6.9 g/dL (ref 6.5–8.1)

## 2022-03-07 LAB — RESP PANEL BY RT-PCR (RSV, FLU A&B, COVID)  RVPGX2
Influenza A by PCR: POSITIVE — AB
Influenza B by PCR: NEGATIVE
Resp Syncytial Virus by PCR: NEGATIVE
SARS Coronavirus 2 by RT PCR: NEGATIVE

## 2022-03-07 LAB — BASIC METABOLIC PANEL
Anion gap: 11 (ref 5–15)
BUN: 15 mg/dL (ref 8–23)
CO2: 21 mmol/L — ABNORMAL LOW (ref 22–32)
Calcium: 8.6 mg/dL — ABNORMAL LOW (ref 8.9–10.3)
Chloride: 104 mmol/L (ref 98–111)
Creatinine, Ser: 1.17 mg/dL (ref 0.61–1.24)
GFR, Estimated: >60 mL/min (ref >60)
Glucose, Bld: 87 mg/dL (ref 70–99)
Potassium: 3.9 mmol/L (ref 3.5–5.1)
Sodium: 136 mmol/L (ref 135–145)

## 2022-03-07 LAB — TROPONIN I (HIGH SENSITIVITY)
Troponin I (High Sensitivity): 137 ng/L (ref <18)
Troponin I (High Sensitivity): 122 ng/L (ref <18)
Troponin I (High Sensitivity): 129 ng/L (ref <18)

## 2022-03-07 LAB — MRSA NEXT GEN BY PCR, NASAL: MRSA by PCR Next Gen: NOT DETECTED

## 2022-03-07 LAB — BRAIN NATRIURETIC PEPTIDE: B Natriuretic Peptide: 962 pg/mL — ABNORMAL HIGH (ref 0.0–100.0)

## 2022-03-07 LAB — MAGNESIUM: Magnesium: 1.9 mg/dL (ref 1.7–2.4)

## 2022-03-07 LAB — TSH: TSH: 0.93 u[IU]/mL (ref 0.350–4.500)

## 2022-03-07 MED ORDER — EZETIMIBE 10 MG PO TABS
10.0000 mg | ORAL_TABLET | Freq: Every day | ORAL | Status: DC
  Administered 2022-03-07 – 2022-03-09 (×3): 10 mg via ORAL
  Filled 2022-03-07 (×3): qty 1

## 2022-03-07 MED ORDER — BUDESON-GLYCOPYRROL-FORMOTEROL 160-9-4.8 MCG/ACT IN AERO: 2.0000 | INHALATION_SPRAY | Freq: Two times a day (BID) | RESPIRATORY_TRACT | Status: DC

## 2022-03-07 MED ORDER — LEVALBUTEROL HCL 1.25 MG/0.5ML IN NEBU: 1.2500 mg | INHALATION_SOLUTION | Freq: Four times a day (QID) | RESPIRATORY_TRACT | Status: DC

## 2022-03-07 MED ORDER — MAGNESIUM SULFATE 2 GM/50ML IV SOLN
2.0000 g | Freq: Once | INTRAVENOUS | Status: AC
  Administered 2022-03-07: 2 g via INTRAVENOUS
  Filled 2022-03-07: qty 50

## 2022-03-07 MED ORDER — OSELTAMIVIR PHOSPHATE 75 MG PO CAPS
75.0000 mg | ORAL_CAPSULE | Freq: Once | ORAL | Status: AC
  Administered 2022-03-07: 75 mg via ORAL
  Filled 2022-03-07: qty 1

## 2022-03-07 MED ORDER — AMIODARONE HCL IN DEXTROSE 360-4.14 MG/200ML-% IV SOLN
60.0000 mg/h | INTRAVENOUS | Status: DC
  Administered 2022-03-07: 60 mg/h via INTRAVENOUS
  Filled 2022-03-07 (×2): qty 200

## 2022-03-07 MED ORDER — IRBESARTAN 150 MG PO TABS
75.0000 mg | ORAL_TABLET | Freq: Every day | ORAL | Status: DC
  Administered 2022-03-07 – 2022-03-09 (×3): 75 mg via ORAL
  Filled 2022-03-07 (×3): qty 1

## 2022-03-07 MED ORDER — ASPIRIN 81 MG PO TBEC: 81.0000 mg | DELAYED_RELEASE_TABLET | Freq: Every day | ORAL | Status: DC

## 2022-03-07 MED ORDER — ZOLPIDEM TARTRATE 5 MG PO TABS
5.0000 mg | ORAL_TABLET | Freq: Every evening | ORAL | Status: DC | PRN
  Administered 2022-03-07 – 2022-03-09 (×2): 5 mg via ORAL
  Filled 2022-03-07 (×2): qty 1

## 2022-03-07 MED ORDER — OXYCODONE HCL 5 MG PO TABS
5.0000 mg | ORAL_TABLET | ORAL | Status: DC | PRN
  Administered 2022-03-08: 5 mg via ORAL
  Filled 2022-03-07: qty 1

## 2022-03-07 MED ORDER — ONDANSETRON HCL 4 MG/2ML IJ SOLN
4.0000 mg | Freq: Four times a day (QID) | INTRAMUSCULAR | Status: DC | PRN
  Administered 2022-03-08: 4 mg via INTRAVENOUS
  Filled 2022-03-07: qty 2

## 2022-03-07 MED ORDER — ROPINIROLE HCL 1 MG PO TABS
2.0000 mg | ORAL_TABLET | Freq: Every day | ORAL | Status: DC
  Administered 2022-03-07 – 2022-03-08 (×3): 2 mg via ORAL
  Filled 2022-03-07 (×3): qty 2

## 2022-03-07 MED ORDER — FUROSEMIDE 10 MG/ML IJ SOLN
40.0000 mg | Freq: Two times a day (BID) | INTRAMUSCULAR | Status: DC
  Administered 2022-03-07 (×2): 40 mg via INTRAVENOUS
  Filled 2022-03-07 (×2): qty 4

## 2022-03-07 MED ORDER — CLOPIDOGREL BISULFATE 75 MG PO TABS
75.0000 mg | ORAL_TABLET | Freq: Every day | ORAL | Status: DC
  Administered 2022-03-07 – 2022-03-09 (×3): 75 mg via ORAL
  Filled 2022-03-07 (×3): qty 1

## 2022-03-07 MED ORDER — METHYLPREDNISOLONE SODIUM SUCC 125 MG IJ SOLR
125.0000 mg | Freq: Every day | INTRAMUSCULAR | Status: DC
  Administered 2022-03-07 – 2022-03-08 (×2): 125 mg via INTRAVENOUS
  Filled 2022-03-07 (×2): qty 2

## 2022-03-07 MED ORDER — ACETAMINOPHEN 650 MG RE SUPP: 650.0000 mg | Freq: Four times a day (QID) | RECTAL | Status: DC | PRN

## 2022-03-07 MED ORDER — PANTOPRAZOLE SODIUM 40 MG PO TBEC
40.0000 mg | DELAYED_RELEASE_TABLET | Freq: Every day | ORAL | Status: DC
  Administered 2022-03-07 – 2022-03-09 (×3): 40 mg via ORAL
  Filled 2022-03-07 (×4): qty 1

## 2022-03-07 MED ORDER — IPRATROPIUM BROMIDE 0.02 % IN SOLN
RESPIRATORY_TRACT | Status: AC
  Administered 2022-03-07: 1 mg
  Filled 2022-03-07: qty 5

## 2022-03-07 MED ORDER — SPIRONOLACTONE 25 MG PO TABS
25.0000 mg | ORAL_TABLET | Freq: Every day | ORAL | Status: DC
  Administered 2022-03-07 – 2022-03-09 (×3): 25 mg via ORAL
  Filled 2022-03-07 (×3): qty 1

## 2022-03-07 MED ORDER — APIXABAN 5 MG PO TABS
5.0000 mg | ORAL_TABLET | Freq: Two times a day (BID) | ORAL | Status: DC
  Administered 2022-03-07 – 2022-03-09 (×5): 5 mg via ORAL
  Filled 2022-03-07 (×5): qty 1

## 2022-03-07 MED ORDER — HEPARIN BOLUS VIA INFUSION
4000.0000 [IU] | Freq: Once | INTRAVENOUS | Status: AC
  Administered 2022-03-07: 4000 [IU] via INTRAVENOUS

## 2022-03-07 MED ORDER — VENLAFAXINE HCL ER 75 MG PO CP24
75.0000 mg | ORAL_CAPSULE | Freq: Every day | ORAL | Status: DC
  Administered 2022-03-07 – 2022-03-09 (×3): 75 mg via ORAL
  Filled 2022-03-07 (×3): qty 1

## 2022-03-07 MED ORDER — OSELTAMIVIR PHOSPHATE 75 MG PO CAPS
75.0000 mg | ORAL_CAPSULE | Freq: Every day | ORAL | Status: DC
  Administered 2022-03-07 – 2022-03-09 (×3): 75 mg via ORAL
  Filled 2022-03-07 (×5): qty 1

## 2022-03-07 MED ORDER — HEPARIN (PORCINE) 25000 UT/250ML-% IV SOLN
1400.0000 [IU]/h | INTRAVENOUS | Status: DC
  Administered 2022-03-07: 1400 [IU]/h via INTRAVENOUS
  Filled 2022-03-07: qty 250

## 2022-03-07 MED ORDER — ACETAMINOPHEN 325 MG PO TABS
650.0000 mg | ORAL_TABLET | Freq: Four times a day (QID) | ORAL | Status: DC | PRN
  Administered 2022-03-07 – 2022-03-09 (×2): 650 mg via ORAL
  Filled 2022-03-07 (×2): qty 2

## 2022-03-07 MED ORDER — CHLORHEXIDINE GLUCONATE CLOTH 2 % EX PADS
6.0000 | MEDICATED_PAD | Freq: Every day | CUTANEOUS | Status: DC
  Administered 2022-03-08: 6 via TOPICAL

## 2022-03-07 MED ORDER — ATORVASTATIN CALCIUM 40 MG PO TABS
80.0000 mg | ORAL_TABLET | Freq: Every day | ORAL | Status: DC
  Administered 2022-03-07 – 2022-03-09 (×3): 80 mg via ORAL
  Filled 2022-03-07 (×3): qty 2

## 2022-03-07 MED ORDER — AMIODARONE HCL 200 MG PO TABS
400.0000 mg | ORAL_TABLET | Freq: Two times a day (BID) | ORAL | Status: DC
  Administered 2022-03-07 – 2022-03-09 (×5): 400 mg via ORAL
  Filled 2022-03-07 (×5): qty 2

## 2022-03-07 MED ORDER — FUROSEMIDE 10 MG/ML IJ SOLN: 40.0000 mg | Freq: Once | INTRAMUSCULAR | Status: DC

## 2022-03-07 MED ORDER — BUDESONIDE 0.5 MG/2ML IN SUSP
0.5000 mg | Freq: Two times a day (BID) | RESPIRATORY_TRACT | Status: DC
  Administered 2022-03-07 – 2022-03-09 (×5): 0.5 mg via RESPIRATORY_TRACT
  Filled 2022-03-07 (×5): qty 2

## 2022-03-07 MED ORDER — METOPROLOL SUCCINATE ER 25 MG PO TB24
25.0000 mg | ORAL_TABLET | Freq: Every day | ORAL | Status: DC
  Administered 2022-03-07 – 2022-03-09 (×3): 25 mg via ORAL
  Filled 2022-03-07 (×4): qty 1

## 2022-03-07 MED ORDER — MORPHINE SULFATE (PF) 2 MG/ML IV SOLN
2.0000 mg | INTRAVENOUS | Status: DC | PRN
  Administered 2022-03-07: 2 mg via INTRAVENOUS
  Filled 2022-03-07: qty 1

## 2022-03-07 MED ORDER — ONDANSETRON HCL 4 MG PO TABS: 4.0000 mg | ORAL_TABLET | Freq: Four times a day (QID) | ORAL | Status: DC | PRN

## 2022-03-07 MED ORDER — LEVALBUTEROL HCL 1.25 MG/0.5ML IN NEBU: 1.2500 mg | INHALATION_SOLUTION | Freq: Once | RESPIRATORY_TRACT | Status: DC

## 2022-03-07 MED ORDER — ALBUTEROL SULFATE (2.5 MG/3ML) 0.083% IN NEBU
2.5000 mg | INHALATION_SOLUTION | RESPIRATORY_TRACT | Status: DC | PRN
  Administered 2022-03-07: 2.5 mg via RESPIRATORY_TRACT
  Filled 2022-03-07: qty 3

## 2022-03-07 MED ORDER — EMPAGLIFLOZIN 10 MG PO TABS
10.0000 mg | ORAL_TABLET | Freq: Every day | ORAL | Status: DC
  Administered 2022-03-07: 10 mg via ORAL
  Filled 2022-03-07 (×4): qty 1

## 2022-03-07 MED ORDER — MUPIROCIN CALCIUM 2 % EX CREA: TOPICAL_CREAM | Freq: Two times a day (BID) | CUTANEOUS | Status: DC

## 2022-03-07 MED ORDER — AMIODARONE LOAD VIA INFUSION
150.0000 mg | Freq: Once | INTRAVENOUS | Status: AC
  Administered 2022-03-07: 150 mg via INTRAVENOUS
  Filled 2022-03-07: qty 83.34

## 2022-03-07 MED ORDER — AMIODARONE HCL IN DEXTROSE 360-4.14 MG/200ML-% IV SOLN
30.0000 mg/h | INTRAVENOUS | Status: DC
  Administered 2022-03-07: 30 mg/h via INTRAVENOUS

## 2022-03-07 MED ORDER — ROPINIROLE HCL 1 MG PO TABS
2.0000 mg | ORAL_TABLET | Freq: Every evening | ORAL | Status: DC | PRN
  Administered 2022-03-07: 2 mg via ORAL
  Filled 2022-03-07: qty 2

## 2022-03-07 MED ORDER — IPRATROPIUM-ALBUTEROL 0.5-2.5 (3) MG/3ML IN SOLN
3.0000 mL | Freq: Four times a day (QID) | RESPIRATORY_TRACT | Status: DC | PRN
  Filled 2022-03-07: qty 3

## 2022-03-07 MED ORDER — FAMOTIDINE 20 MG PO TABS
20.0000 mg | ORAL_TABLET | Freq: Every evening | ORAL | Status: DC
  Administered 2022-03-07 – 2022-03-08 (×2): 20 mg via ORAL
  Filled 2022-03-07 (×2): qty 1

## 2022-03-07 MED ORDER — PERFLUTREN LIPID MICROSPHERE
1.0000 mL | INTRAVENOUS | Status: AC | PRN
  Administered 2022-03-07: 5 mL via INTRAVENOUS

## 2022-03-07 MED ORDER — IPRATROPIUM BROMIDE 0.02 % IN SOLN: 0.5000 mg | Freq: Four times a day (QID) | RESPIRATORY_TRACT | Status: DC

## 2022-03-07 MED ORDER — IPRATROPIUM-ALBUTEROL 0.5-2.5 (3) MG/3ML IN SOLN
3.0000 mL | Freq: Four times a day (QID) | RESPIRATORY_TRACT | Status: DC
  Administered 2022-03-07 – 2022-03-08 (×7): 3 mL via RESPIRATORY_TRACT
  Filled 2022-03-07 (×6): qty 3

## 2022-03-07 MED ORDER — IPRATROPIUM-ALBUTEROL 0.5-2.5 (3) MG/3ML IN SOLN: 3.0000 mL | Freq: Four times a day (QID) | RESPIRATORY_TRACT | Status: DC

## 2022-03-07 MED ORDER — ZOLPIDEM TARTRATE 5 MG PO TABS
5.0000 mg | ORAL_TABLET | Freq: Once | ORAL | Status: AC
  Administered 2022-03-07: 5 mg via ORAL
  Filled 2022-03-07: qty 1

## 2022-03-07 MED ORDER — GUAIFENESIN 100 MG/5ML PO LIQD
5.0000 mL | ORAL | Status: DC | PRN
  Administered 2022-03-07 – 2022-03-09 (×5): 5 mL via ORAL
  Filled 2022-03-07 (×5): qty 5

## 2022-03-07 MED ORDER — NICOTINE 21 MG/24HR TD PT24
21.0000 mg | MEDICATED_PATCH | Freq: Every day | TRANSDERMAL | Status: DC
  Administered 2022-03-07 – 2022-03-09 (×3): 21 mg via TRANSDERMAL
  Filled 2022-03-07 (×3): qty 1

## 2022-03-07 MED ORDER — LEVALBUTEROL HCL 0.63 MG/3ML IN NEBU
INHALATION_SOLUTION | RESPIRATORY_TRACT | Status: AC
  Administered 2022-03-07: 9 mg
  Filled 2022-03-07: qty 9
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
## 2022-03-07 NOTE — ED Notes (Signed)
Date and time results received: 03/07/22 0149   Test: Troponin Critical Value: 137  Name of Provider Notified: Bernette Mayers, MD

## 2022-03-07 NOTE — Assessment & Plan Note (Addendum)
-   With acute exacerbation - Cardiomegaly, pulmonary edema on chest x-ray, dyspnea on exertion, orthopnea -Last echo was in October and showed ejection fraction of 30-35% with grade 1 diastolic dysfunction - Continue Lipitor, metoprolol, Aldactone - Interstitial edema on chest x-ray, start 40 mg IV Lasix - Daily weights and strict intake and output - Fluid restrictions - Continue to monitor

## 2022-03-07 NOTE — ED Provider Notes (Signed)
[facility] EMERGENCY DEPARTMENT  Provider Note  CSN: [id number] Arrival date & time: 03/07/22 [id number]  History Chief Complaint  Patient presents with   Respiratory Distress    Kevin Smith is a 66 y.o. male with complex medical history including CAD s/p CABG and stents, last cathed in Oct 2023, HFrEF, and COPD reports about 24 hours of worsening SOB and cough. No fever. No chest pains. He has been using nebs all day without improvement. EMS was called today and found him to be in rapid afib which is new for him. He was not given neb enroute due to his tachycardia. He was given solumedrol and placed on 100% NRB for comfort. No reported hypoxia.    Home Medications Prior to Admission medications   Medication Sig Start Date End Date Taking? Authorizing Provider  acetaminophen (TYLENOL) 500 MG tablet Take 1 tablet (500 mg total) by mouth every 6 (six) hours as needed for mild pain. 01/02/22   Arrien, Jimmy Picket, MD  albuterol (PROVENTIL) (2.5 MG/3ML) 0.083% nebulizer solution Take 3 mLs (2.5 mg total) by nebulization every 4 (four) hours as needed for wheezing or shortness of breath. 10/12/21   Tanda Rockers, MD  atorvastatin (LIPITOR) 80 MG tablet Take 1 tablet (80 mg total) by mouth daily. 01/09/22   Janora Norlander, DO  Budeson-Glycopyrrol-Formoterol (BREZTRI AEROSPHERE) 160-9-4.8 MCG/ACT AERO Inhale 2 puffs into the lungs 2 (two) times daily. 09/22/21   Loman Brooklyn, FNP  clopidogrel (PLAVIX) 75 MG tablet Take 1 tablet by mouth once daily 01/30/22   Ahmed Prima, [place] M, PA-C  desvenlafaxine (PRISTIQ) 50 MG 24 hr tablet Take 1 tablet (50 mg total) by mouth daily. 01/23/22   [name], NP  empagliflozin (JARDIANCE) 10 MG TABS tablet Take 1 tablet (10 mg total) by mouth daily. 01/03/22   Arrien, Jimmy Picket, MD  EQ ALLERGY RELIEF, CETIRIZINE, 10 MG tablet Take 1 tablet by mouth once daily 01/03/22   [name], FNP  ezetimibe (ZETIA) 10 MG tablet Take 1 tablet  (10 mg total) by mouth daily. 08/16/21   Loman Brooklyn, FNP  famotidine (PEPCID) 20 MG tablet One after supper Patient taking differently: Take 20 mg by mouth every evening. 10/12/21   Tanda Rockers, MD  metoprolol succinate (TOPROL-XL) 25 MG 24 hr tablet TAKE 1 TABLET BY MOUTH ONCE DAILY . APPOINTMENT REQUIRED FOR FUTURE REFILLS 01/30/22   Ahmed Prima, Fransisco Hertz, PA-C  nitroGLYCERIN (NITROSTAT) 0.4 MG SL tablet Place 1 tablet (0.4 mg total) under the tongue every 5 (five) minutes x 3 doses as needed for chest pain. 03/14/17   Cheryln Manly, NP  pantoprazole (PROTONIX) 40 MG tablet Take 1 tablet by mouth once daily 02/20/22   [name], NP  rOPINIRole (REQUIP) 1 MG tablet Take 2 tablets (2 mg total) by mouth at bedtime. Patient taking differently: Take 2 mg by mouth at bedtime as needed (restless leg). 11/09/21   Loman Brooklyn, FNP  spironolactone (ALDACTONE) 25 MG tablet Take 1 tablet (25 mg total) by mouth daily. 01/03/22   Arrien, Jimmy Picket, MD  valsartan (DIOVAN) 80 MG tablet Take 1 tablet (80 mg total) by mouth daily. 02/01/22   Tanda Rockers, MD  VENTOLIN HFA 108 (90 Base) MCG/ACT inhaler INHALE 2 PUFFS BY MOUTH EVERY 6 HOURS AS NEEDED FOR WHEEZING AND FOR SHORTNESS OF BREATH Patient taking differently: Inhale 2 puffs into the lungs every 6 (six) hours as needed for wheezing or shortness of  breath. 11/15/21   [name], FNP  zolpidem (AMBIEN) 5 MG tablet TAKE 1 TABLET BY MOUTH AT BEDTIME AS NEEDED FOR SLEEP 02/07/22   Daryll Drown, NP     Allergies    Sulfa antibiotics   Review of Systems   Review of Systems Please see HPI for pertinent positives and negatives  Physical Exam BP 110/87   Pulse (!) 121   Temp 98.6 F (37 C) (Oral)   Resp 16   Ht 5\' 11"  (1.803 m)   Wt 105.2 kg   SpO2 96%   BMI 32.36 kg/m   Physical Exam Vitals and nursing note reviewed.  Constitutional:      Appearance: Normal appearance.  HENT:     Head: Normocephalic and  atraumatic.     Nose: Nose normal.     Mouth/Throat:     Mouth: Mucous membranes are moist.  Eyes:     Extraocular Movements: Extraocular movements intact.     Conjunctiva/sclera: Conjunctivae normal.  Cardiovascular:     Rate and Rhythm: Tachycardia present. Rhythm irregular.  Pulmonary:     Effort: Respiratory distress present.     Breath sounds: Wheezing and rales present.  Abdominal:     General: Abdomen is flat.     Palpations: Abdomen is soft.     Tenderness: There is no abdominal tenderness.  Musculoskeletal:        General: No swelling. Normal range of motion.     Cervical back: Neck supple.     Right lower leg: Edema (trace) present.     Left lower leg: Edema (trace) present.  Skin:    General: Skin is warm and dry.  Neurological:     General: No focal deficit present.     Mental Status: He is alert.  Psychiatric:        Mood and Affect: Mood normal.     ED Results / Procedures / Treatments   EKG EKG Interpretation  Date/Time:  Tuesday March 07 2022 00:46:03 EST Ventricular Rate:  142 PR Interval:    QRS Duration: 161 QT Interval:  309 QTC Calculation: 475 R Axis:   43 Text Interpretation: Atrial flutter with 2:1 AV block Right bundle branch block Inferior infarct, age indeterminate ST depr, consider ischemia, anterolateral lds Since last tracing Atrial fibrillation with rapid ventricular response has replaced Sinus rhythm Confirmed by 12-03-1990 [phone]) on 03/07/2022 12:55:59 AM  Procedures .Critical Care  Performed by: 03/09/2022, MD Authorized by: Pollyann Savoy, MD   Critical care provider statement:    Critical care time (minutes):  80   Critical care time was exclusive of:  Separately billable procedures and treating other patients   Critical care was necessary to treat or prevent imminent or life-threatening deterioration of the following conditions:  Cardiac failure and respiratory failure   Critical care was time spent  personally by me on the following activities:  Development of treatment plan with patient or surrogate, discussions with consultants, evaluation of patient's response to treatment, examination of patient, ordering and review of laboratory studies, ordering and review of radiographic studies, ordering and performing treatments and interventions, pulse oximetry, re-evaluation of patient's condition and review of old charts   Care discussed with: admitting provider     Medications Ordered in the ED Medications  levalbuterol (XOPENEX) nebulizer solution 1.25 mg ( Nebulization Canceled Entry 03/07/22 0119)  amiodarone (NEXTERONE) 1.8 mg/mL load via infusion 150 mg (150 mg Intravenous Bolus from Bag 03/07/22 0127)  Followed by  amiodarone (NEXTERONE PREMIX) 360-4.14 MG/200ML-% (1.8 mg/mL) IV infusion (60 mg/hr Intravenous New Bag/Given 03/07/22 0125)    Followed by  amiodarone (NEXTERONE PREMIX) 360-4.14 MG/200ML-% (1.8 mg/mL) IV infusion (has no administration in time range)  rOPINIRole (REQUIP) tablet 2 mg (has no administration in time range)  magnesium sulfate IVPB 2 g 50 mL (0 g Intravenous Stopped 03/07/22 0220)  levalbuterol (XOPENEX) 0.63 MG/3ML nebulizer solution (9 mg  Given 03/07/22 0117)  ipratropium (ATROVENT) 0.02 % nebulizer solution (1 mg  Given 03/07/22 0117)  oseltamivir (TAMIFLU) capsule 75 mg (75 mg Oral Given 03/07/22 0257)    Initial Impression and Plan  Patient here with SOB, new onset afib with RVR, consider COPD exacerbation, viral illness, PNA or acute CHF. Will place on BiPAP for work of breathing. Check labs and CXR. Rate control depending on evidence of CHF.   ED Course   Clinical Course as of 03/07/22 0315  Tue Mar 07, 2022  0109 Patient unable to tolerate BiPAP. Will give Xopenex which may have less effect on his HR and Magnesium.  [CS]  0116 CBC is normal.  [CS]  0131 I personally viewed the images from radiology studies and agree with radiologist  interpretation: CXR is concerning for pulm edema. Will give Amiodarone bolus and infusion for rate control. [CS]  0156 BMP is unremarkable. Initial Trop is elevated, BNP is elevated. HR is improving. Will discuss with Cardiology.  [CS]  0210 Influenza is positive. Will begin Tamiflu.  [CS]  2 Spoke with Dr. Cathie Hoops, Cardiologist on call who agrees with management so far, recommends admission to Medicine service ([facility] or [facility]) and begin heparin for anticoagulation.  [CS]  0312 Spoke with Dr. Ander Slade, hospitalist, who will evaluate for admission.  [CS]    Clinical Course User Index [CS] Truddie Hidden, MD     MDM Rules/Calculators/A&P Medical Decision Making Problems Addressed: Acute congestive heart failure, unspecified heart failure type [facility]): acute illness or injury that poses a threat to life or bodily functions Atrial fibrillation, rapid [facility]): acute illness or injury that poses a threat to life or bodily functions COPD exacerbation ([place]): acute illness or injury that poses a threat to life or bodily functions Influenza A: acute illness or injury that poses a threat to life or bodily functions  Amount and/or Complexity of Data Reviewed Labs: ordered. Decision-making details documented in ED Course. Radiology: ordered and independent interpretation performed. Decision-making details documented in ED Course. ECG/medicine tests: ordered and independent interpretation performed. Decision-making details documented in ED Course.  Risk Prescription drug management. Decision regarding hospitalization.    Final Clinical Impression(s) / ED Diagnoses Final diagnoses:  Atrial fibrillation, rapid ([place])  COPD exacerbation ([place])  Influenza A  Acute congestive heart failure, unspecified heart failure type [facility])    Rx / DC Orders ED Discharge Orders     None        Truddie Hidden, MD 03/07/22 [phone]

## 2022-03-07 NOTE — Assessment & Plan Note (Signed)
-   Tamiflu given in the ED Continue Tamiflu for 5 days Supportive care

## 2022-03-07 NOTE — Progress Notes (Signed)
Pt states he is feeling better than arrival to ER but still having difficulty breathing, left O2 at 5L at this time.  Pt showing good urine output with IV lasix. Beginning to have a productive cough with tan/brown thin sputum.   Gave morphine and tylenol today for rib pain and headache.  Pt has remained NSR on monitor since shortly after admission.  Afebrile this shift.

## 2022-03-07 NOTE — Assessment & Plan Note (Signed)
-   Counseled on the importance of cessation especially in the setting of COPD - Nicotine patch ordered - Continue to monitor

## 2022-03-07 NOTE — Assessment & Plan Note (Signed)
Continue Lipitor and Zetia 

## 2022-03-07 NOTE — ED Triage Notes (Signed)
  Patient BIB EMS for respiratory distress and afib rvr.  Patient has hx of MI and CAD.  Was on diuretic but was taken off of it.  Patient states symptoms started earlier yesterday morning and that his breathing treatments usually help him feel better.  Patient had received several treatments with no relief.  125 mg solumedrol given by EMS.  Pain 6/10, dyspnea when breathing.

## 2022-03-07 NOTE — Progress Notes (Signed)
*  PRELIMINARY RESULTS* Echocardiogram 2D Echocardiogram has been performed.  Carolyne Fiscal 03/07/2022, 9:58 AM

## 2022-03-07 NOTE — Assessment & Plan Note (Signed)
Continue beta blocker. 

## 2022-03-07 NOTE — Assessment & Plan Note (Signed)
-   Continue metoprolol, Aldactone - Continue monitor

## 2022-03-07 NOTE — Assessment & Plan Note (Signed)
-   Patient denies alcohol abuse but is clearly documented in his chart - Low threshold for CIWA

## 2022-03-07 NOTE — Plan of Care (Signed)
Patient seen and rounded on in the ICU this morning. Admitted after midnight.  66 year old male with PMH CAD s/p CABG with recent LHC 12/27/2021 with some residual distal disease in mid Lcx. Also PMH depression/anxiety, COPD, tobacco use, HTN, HLD. He continues to smoke and has been strongly recommended to quit. Not fully compliant with asa but does take plavix regularly.  He also has ongoing difficulty with depression/anxiety and has been undergoing evaluations outpatient following with social work and referral to psychiatry. He presented with respiratory distress/shortness of breath.  He also had some chest discomfort.  He was found to be hypoxic and placed on 15 L nonrebreather.  Workup was notable for influenza A.  He was started on Tamiflu and steroids. He was also found to be in new onset A-fib with RVR and was started on a heparin drip and amiodarone drip. He converted back to NSR.  Troponins were also elevated. Cardiology was consulted and he was transitioned to Eliquis and oral amiodarone.  Arrhythmia was considered likely precipitated in setting of acute illness/infection.  He will follow-up outpatient and if does remain in NSR, consideration for discontinuation of amiodarone is recommended per cardiology.  He was taken off of aspirin in setting of starting on Eliquis as well and will continue on Plavix and Eliquis going forward.  Plan: - appreciate cardiology evaluation - continue plavix and eliquis; d/c asa - continue oral amio - outpatient followup with cardiology - wean O2 as able - continue nebs, steroids, and tamiflu - hopeful d/c in 1-2 days as O2 improves  - further plan as per H&P  Lewie Chamber, MD [facility] 03/07/2022, 12:02 PM

## 2022-03-07 NOTE — Assessment & Plan Note (Signed)
Continue Protonix °

## 2022-03-07 NOTE — Assessment & Plan Note (Signed)
-   Hypoxic requiring high flow nasal cannula - Chest x-ray shows cardiomegaly with suspected interstitial edema, but no acute infiltrate - Continue albuterol as needed, DuoNeb scheduled - Start steroid - No antibiotic indicated at this time - Likely exacerbated by influenza A

## 2022-03-07 NOTE — Progress Notes (Signed)
OT Cancellation Note  Patient Details Name: Kevin Smith MRN: [id number] DOB: February 19, 1956   Cancelled Treatment:    Reason Eval/Treat Not Completed: Fatigue/lethargy limiting ability to participate. Pt was able to provide history but refused to complete physical portion of evaluation due to "not feeling good." Pt was educated on purpose of therapy. Pt stated he thinks he needs to go somewhere but would did not wish to complete the evaluation today to start that processing. Will attempt to see pt later as time permits.   Tylia Ewell OT, MOT   Danie Chandler 03/07/2022, 1:59 PM

## 2022-03-07 NOTE — Assessment & Plan Note (Signed)
-   Troponin 137, 122, 129 - Likely demand ischemia in the setting of A-fib with RVR and hypoxia - Consult cardiology - Echo in the a.m.
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
## 2022-03-07 NOTE — TOC Initial Note (Signed)
Transition of Care [facility]) - Initial/Assessment Note    Patient Details  Name: Kevin Smith MRN: [id number] Date of Birth: [birth year]  [facility] CM/SW Contact:    Shade Flood, LCSW Phone Number: 03/07/2022, 12:45 PM  Clinical Narrative:                  Pt admitted from home. Received [facility] consults for SA treatment resources, counseling resources, and medication assistance.  Met with pt at bedside to assess. Pt reports that he lives alone. He is independent in ADLs.  Pt has a cane but he thinks he does better without it. Pt has a neb machine at home. Pt states he does not have O2 at home. Pt currently on 5L.  SA treatment resource list and counseling list provided. SDOH resource lists also provided. Much emotional support provided to pt who is tearful and stating that he doesn't want to live anymore. Pt denies suicidal thoughts or plans. He states "I just don't care about nothing" and that his best years are behind him. He states that he is disabled, does not have any energy and his family has "disowned" him. Pt reports that he has been restarted on Effexor here in the hospital and that it is a medication he has taken in the past that was helpful.   [facility] referral and pt agreeable. Message sent to chaplain requesting follow up.   Discussed concerns related to medication assistance and pt stated that he doesn't have any transport other than the [facility] transport and that it's not always easy to get his medications. He would like to switch pharmacies to one that delivers. Pt resides in [place] and there are two pharmacies in [place] that deliver. TOC will follow up and assist with process for making this change.   Will follow up with pt tomorrow for further emotional support and dc planning.    Expected Discharge Plan: [place] Barriers to Discharge: Continued Medical Work up   Patient Goals and CMS Choice Patient states their  goals for this hospitalization and ongoing recovery are:: return home CMS Medicare.gov Compare Post Acute Care list provided to:: Patient Choice offered to / list presented to : Patient      Expected Discharge Plan and Services In-house Referral: Clinical Social Work   Post Acute Care Choice: [place] arrangements for the past 2 months: Apartment                                      Prior Living Arrangements/Services Living arrangements for the past 2 months: Apartment Lives with:: Self Patient language and need for interpreter reviewed:: Yes Do you feel safe going back to the place where you live?: Yes      Need for Family Participation in Patient Care: No (Comment)   Current home services: DME Criminal Activity/Legal Involvement Pertinent to Current Situation/Hospitalization: No - Comment as needed  Activities of Daily Living Home Assistive Devices/Equipment: Nebulizer, Eyeglasses, Dentures (specify type), Cane (specify quad or straight), Shower chair without back [employer]) ADL Screening (condition at time of admission) Patient's cognitive ability adequate to safely complete daily activities?: Yes Is the patient deaf or have difficulty hearing?: Yes (deaf in left ear, HOH right ear) Does the patient have difficulty seeing, even when wearing glasses/contacts?: No Does the patient have difficulty concentrating, remembering, or making decisions?: No Patient able to  express need for assistance with ADLs?: Yes Does the patient have difficulty dressing or bathing?: Yes Independently performs ADLs?: Yes (appropriate for developmental age) Does the patient have difficulty walking or climbing stairs?: Yes Weakness of Legs: Both Weakness of Arms/Hands: Both  Permission Sought/Granted Permission sought to share information with : Facility Art therapist granted to share information with : Yes, Verbal Permission Granted     Permission granted to  share info w AGENCY: [place]        Emotional Assessment Appearance:: Appears stated age Attitude/Demeanor/Rapport: Engaged Affect (typically observed): Pleasant, Tearful/Crying Orientation: : Oriented to Self, Oriented to Place, Oriented to  Time, Oriented to Situation Alcohol / Substance Use: Alcohol Use Psych Involvement: No (comment)  Admission diagnosis:  Influenza A [J10.1] New onset atrial fibrillation (HCC) [I48.91] COPD exacerbation ([place]) [J44.1] Atrial fibrillation, rapid ([place]) [I48.91] Acute congestive heart failure, unspecified heart failure type ([place]) [I50.9] Patient Active Problem List   Diagnosis Date Noted   New onset atrial fibrillation ([place]) 03/07/2022   Elevated troponin 03/07/2022   Influenza A 03/07/2022   ERRONEOUS ENCOUNTER--DISREGARD 02/17/2022   Anxiety 01/23/2022   Alcohol abuse 23/55/7322   Systolic CHF, chronic ([place]) 12/31/2021   Acute kidney injury superimposed on chronic kidney disease ([place]) 12/31/2021   Class 1 obesity 12/31/2021   NSTEMI (non-ST elevated myocardial infarction) ([place]) 12/27/2021   Primary insomnia 11/09/2021   Restless legs 11/09/2021   Cigarette smoker 08/18/2021   Upper airway cough syndrome 08/18/2021   Cervical radiculopathy 03/21/2021   History of ST elevation myocardial infarction (STEMI) 12/22/2020   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 ([place]) 09/08/2020   Macrocytosis 03/31/2020   Gastroesophageal reflux disease 01/02/2020   Erectile dysfunction 01/02/2020   COPD with acute exacerbation ([place]) 09/15/2018   Coronary artery disease involving 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 ([place]) 03/12/2017   COPD GOLD 2  11/22/2015   Nicotine dependence, uncomplicated 02/54/2706   Aortic aneurysm ([place]) 2017   Essential hypertension 01/04/2015   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 [place] [address] Phone: [phone] Fax: [place] [address] [place] [address] Phone: [phone] Fax: [phone]     Social Determinants of Health (SDOH) Social History: [place]: Food Insecurity Present (03/07/2022)  Housing: Low Risk  (03/07/2022)  Transportation Needs: Unmet Transportation Needs (03/07/2022)  Utilities: Not At Risk (03/07/2022)  Alcohol Screen: Medium Risk (11/25/2020)  Depression (PHQ2-9): High Risk (01/23/2022)  Financial Resource Strain: Medium Risk (11/25/2020)  Physical Activity: Insufficiently Active (11/25/2020)  Social Connections: Socially Isolated (11/25/2020)  Stress: No Stress Concern Present (11/25/2020)  Tobacco Use: High Risk (03/07/2022)   SDOH Interventions: Housing Interventions: Intervention Not Indicated   Readmission Risk Interventions    03/07/2022   12:41 PM  Readmission Risk Prevention Plan  Transportation Screening Complete  Home Care Screening Complete  Medication Review (RN CM) Complete

## 2022-03-07 NOTE — Progress Notes (Addendum)
Patient is currently on 5L [place], hooked up humidification bottle for patient.

## 2022-03-07 NOTE — Assessment & Plan Note (Signed)
-   Likely triggered by influenza A - Continue heparin drip - Currently on amiodarone drip as started in the ED - Update echo - Check TSH - Associated troponin leak, continue to trend troponin - Continue to monitor
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
## 2022-03-08 DIAGNOSIS — I255 Ischemic cardiomyopathy: Secondary | ICD-10-CM | POA: Diagnosis not present

## 2022-03-08 DIAGNOSIS — I4891 Unspecified atrial fibrillation: Secondary | ICD-10-CM | POA: Diagnosis not present

## 2022-03-08 DIAGNOSIS — Z951 Presence of aortocoronary bypass graft: Secondary | ICD-10-CM

## 2022-03-08 LAB — CBC WITH DIFFERENTIAL/PLATELET
Abs Immature Granulocytes: 0.07 10*3/uL (ref 0.00–0.07)
Basophils Absolute: 0 10*3/uL (ref 0.0–0.1)
Basophils Relative: 0 %
Eosinophils Absolute: 0 10*3/uL (ref 0.0–0.5)
Eosinophils Relative: 0 %
HCT: 43.2 % (ref 39.0–52.0)
Hemoglobin: 14.1 g/dL (ref 13.0–17.0)
Immature Granulocytes: 1 %
Lymphocytes Relative: 5 %
Lymphs Abs: 0.6 10*3/uL — ABNORMAL LOW (ref 0.7–4.0)
MCH: 31.3 pg (ref 26.0–34.0)
MCHC: 32.6 g/dL (ref 30.0–36.0)
MCV: 95.8 fL (ref 80.0–100.0)
Monocytes Absolute: 0.6 10*3/uL (ref 0.1–1.0)
Monocytes Relative: 6 %
Neutro Abs: 9.2 10*3/uL — ABNORMAL HIGH (ref 1.7–7.7)
Neutrophils Relative %: 88 %
Platelets: 187 10*3/uL (ref 150–400)
RBC: 4.51 MIL/uL (ref 4.22–5.81)
RDW: 14.4 % (ref 11.5–15.5)
WBC: 10.5 10*3/uL (ref 4.0–10.5)
nRBC: 0 % (ref 0.0–0.2)

## 2022-03-08 LAB — BASIC METABOLIC PANEL
Anion gap: 12 (ref 5–15)
BUN: 29 mg/dL — ABNORMAL HIGH (ref 8–23)
CO2: 25 mmol/L (ref 22–32)
Calcium: 8.7 mg/dL — ABNORMAL LOW (ref 8.9–10.3)
Chloride: 100 mmol/L (ref 98–111)
Creatinine, Ser: 1.55 mg/dL — ABNORMAL HIGH (ref 0.61–1.24)
GFR, Estimated: 49 mL/min — ABNORMAL LOW (ref >60)
Glucose, Bld: 138 mg/dL — ABNORMAL HIGH (ref 70–99)
Potassium: 4 mmol/L (ref 3.5–5.1)
Sodium: 137 mmol/L (ref 135–145)

## 2022-03-08 LAB — GLUCOSE, CAPILLARY
Glucose-Capillary: 168 mg/dL — ABNORMAL HIGH (ref 70–99)
Glucose-Capillary: 127 mg/dL — ABNORMAL HIGH (ref 70–99)

## 2022-03-08 LAB — MAGNESIUM: Magnesium: 2.1 mg/dL (ref 1.7–2.4)

## 2022-03-08 MED ORDER — LORAZEPAM 1 MG PO TABS: 1.0000 mg | ORAL_TABLET | ORAL | Status: DC | PRN

## 2022-03-08 MED ORDER — ADULT MULTIVITAMIN W/MINERALS CH
1.0000 | ORAL_TABLET | Freq: Every day | ORAL | Status: DC
  Administered 2022-03-08 – 2022-03-09 (×2): 1 via ORAL
  Filled 2022-03-08 (×2): qty 1

## 2022-03-08 MED ORDER — FOLIC ACID 1 MG PO TABS
1.0000 mg | ORAL_TABLET | Freq: Every day | ORAL | Status: DC
  Administered 2022-03-08 – 2022-03-09 (×2): 1 mg via ORAL
  Filled 2022-03-08 (×2): qty 1

## 2022-03-08 MED ORDER — IPRATROPIUM-ALBUTEROL 0.5-2.5 (3) MG/3ML IN SOLN
3.0000 mL | Freq: Two times a day (BID) | RESPIRATORY_TRACT | Status: DC
  Administered 2022-03-09: 3 mL via RESPIRATORY_TRACT

## 2022-03-08 MED ORDER — THIAMINE HCL 100 MG/ML IJ SOLN: 100.0000 mg | Freq: Every day | INTRAMUSCULAR | Status: DC

## 2022-03-08 MED ORDER — LORAZEPAM 2 MG/ML IJ SOLN
1.0000 mg | INTRAMUSCULAR | Status: DC | PRN
  Administered 2022-03-08: 2 mg via INTRAVENOUS
  Filled 2022-03-08: qty 1

## 2022-03-08 MED ORDER — DIAZEPAM 2 MG PO TABS
2.0000 mg | ORAL_TABLET | Freq: Three times a day (TID) | ORAL | Status: DC
  Administered 2022-03-08 – 2022-03-09 (×4): 2 mg via ORAL
  Filled 2022-03-08 (×4): qty 1

## 2022-03-08 MED ORDER — METHYLPREDNISOLONE SODIUM SUCC 125 MG IJ SOLR
60.0000 mg | Freq: Two times a day (BID) | INTRAMUSCULAR | Status: DC
  Administered 2022-03-09: 60 mg via INTRAVENOUS
  Filled 2022-03-08: qty 2

## 2022-03-08 MED ORDER — THIAMINE MONONITRATE 100 MG PO TABS
100.0000 mg | ORAL_TABLET | Freq: Every day | ORAL | Status: DC
  Administered 2022-03-08 – 2022-03-09 (×2): 100 mg via ORAL
  Filled 2022-03-08 (×2): qty 1

## 2022-03-08 MED ORDER — LORAZEPAM 2 MG/ML IJ SOLN
2.0000 mg | Freq: Once | INTRAMUSCULAR | Status: AC
  Administered 2022-03-08: 2 mg via INTRAVENOUS
  Filled 2022-03-08: qty 1

## 2022-03-08 MED ORDER — FUROSEMIDE 40 MG PO TABS
40.0000 mg | ORAL_TABLET | Freq: Every day | ORAL | Status: DC
  Administered 2022-03-08 – 2022-03-09 (×2): 40 mg via ORAL
  Filled 2022-03-08 (×2): qty 1

## 2022-03-08 MED ORDER — DM-GUAIFENESIN ER 30-600 MG PO TB12
1.0000 | ORAL_TABLET | Freq: Two times a day (BID) | ORAL | Status: DC
  Administered 2022-03-08 – 2022-03-09 (×3): 1 via ORAL
  Filled 2022-03-08 (×3): qty 1

## 2022-03-08 NOTE — Plan of Care (Signed)
  Problem: Acute Rehab PT Goals(only PT should resolve) Goal: Pt Will Go Supine/Side To Sit Outcome: Progressing Flowsheets (Taken 03/08/2022 1212) Pt will go Supine/Side to Sit:  Independently  with modified independence Goal: Patient Will Transfer Sit To/From Stand Outcome: Progressing Flowsheets (Taken 03/08/2022 1212) Patient will transfer sit to/from stand: with modified independence Goal: Pt Will Transfer Bed To Chair/Chair To Bed Outcome: Progressing Flowsheets (Taken 03/08/2022 1212) Pt will Transfer Bed to Chair/Chair to Bed: with modified independence Goal: Pt Will Ambulate Outcome: Progressing Flowsheets (Taken 03/08/2022 1212) Pt will Ambulate:  100 feet  with supervision  with modified independence  with least restrictive assistive device   12:12 PM, 03/08/22 Ocie Bob, MPT Physical Therapist with [facility] [phone] office [phone] mobile phone

## 2022-03-08 NOTE — Discharge Instructions (Signed)
FOOD - By Location  [place]:  [facility] - [place] 11:30-1:00 [address] - [address] [place] [phone]  Food/Groceries Wednesday 10-2 pm Mobile Pantry and Food boxes [employer] - https://bit.ly/[email or web address] Bagged lunches/breakfast - Parents can request service online and then pick up their  meals Monday-Friday from 11:30-12:00. [facility] delivers the meals each day. We sort,  combine and place meals for each family on tables for pickup so that we adhere to  social distancing.   [facility], [employer]: [address] [place],  Food distribution by appointment. Donations accepted 10 a.m. to noon, Mondays, and  noon to p.m. Thursdays. [facility] also has clothes closet by appointment. [facility] [phone]  Services Offered: Civil Service fast streamer, shoes, food  TRANSPORTATION  Agency Name: [facility] [place] (ADTS/RCATS/skat) Address: [address], [place], [place] [address] Phone: [phone] Website: www.adtsrc.org Services Offered: Meals on [employer]. Home care, at home assisted living,  volunteer services, [facility], RC[employer] and SKAT transportation system.  Agency Name: [facility] Address: [address] [place], [place] [address] Phone: [phone] Email: [email or web address] Website: www.[place].[email or web address] Services Offered: Transportation for a fee.

## 2022-03-08 NOTE — Progress Notes (Signed)
PROGRESS NOTE     Kevin Smith, is a 66 y.o. male, DOB - 07/01/1955, [id number]  Admit date - 03/07/2022   Admitting Physician Rolla Plate, DO  Outpatient Primary MD for the patient is Kevin Lynn, NP  LOS - 1  Chief Complaint  Patient presents with   Respiratory Distress      Brief Narrative:   66 y.o. male with medical history significant of coronary artery disease with myocardial infarction, anxiety, depression, GERD, hypertension, hyperlipidemia, systolic CHF, COPD, current smoker admitted on 03/07/2022 with new onset atrial fibrillation with RVR and acute hypoxic respiratory failure in the setting of influenza A infection    -Assessment and Plan: 1) New onset atrial fibrillation ([place]) - Likely triggered by influenza A -Converted to sinus rhythm on IV amiodarone -Transition to oral amiodarone -Transitioned from IV heparin to Eliquis - Currently on amiodarone drip as started in the ED -At discharge-we will decrease oral dose to 200 mg bid for a week then 200 mg daily  -Echo with EF of 30 to 35%, left ventricular global hypokinesis noted, left and right atrium mildly dilated, no mitral stenosis no aortic stenosis -TSH WNL -Continue Toprol-XL 25 mg daily -Avoid Cardizem due to low EF  2)Coronary artery disease involving 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 (HCC) - Troponin 137 >> 122 >> 129-suspect demand ischemia in the setting of A-fib with RVR and influenza -No further chest pains after achieving rate control/conversion to sinus rhythm -Continue Toprol-XL, Lipitor -Cardiology consult appreciated-- CAth 12/27/21 with PCI of mid circumflex continued distal LCX occlusion Known occlusions of SVG to OM and RCA Patent LIMA to LAD Limited options for repeat interventions Given need for eliquis use Plavix only for CAD   3)HFrEF/ combined diastolic and chronic systolic CHF--ischemic cardiomyopathy - With acute exacerbation due to A-fib with  RVR and flu --Echo on 03/07/2022 with EF of 30 to 35%, left ventricular global hypokinesis noted--similar to prior -Prior echo from 12/28/2021  showed ejection fraction of 30-35% with grade 1 diastolic dysfunction -Currently on Aldactone and Lasix -Creatinine trending up may consider diuretic holiday  4)Essential hypertension - Continue metoprolol, Aldactone, Lasix  5) heavy and frequent alcohol use--high risk for DTs - -Folic acid thiamine and multivitamin as ordered -Lorazepam per CIWA protocol  6)Influenza A - Okay to complete Tamiflu, supportive care  -or 5 days Supportive care  7) tobacco abuse--continue nicotine patch patient is not ready to quit smoking  8)Gastroesophageal reflux disease - Continue Protonix  9)COPD with acute exacerbation (HCC) - In the setting of influenza A -Bronchodilators and steroids as ordered  10) acute hypoxic respiratory failure--- multifactorial in the setting of CHF exacerbation, influenza A infection, and COPD exacerbation in the smoker -Patient was on 5 L of oxygen able to wean to 2 L via nasal cannula, patient had desaturations with activity =-Patient refuses home O2, I suspect because she wants to smoke when he gets home, so we will keep him another day or 2 until hypoxia resolves she can go home without oxygen  11) generalized weakness and deconditioning--- PT OT eval appreciated recommends home health therapy  Status is: Inpatient   Disposition: The patient is from: Home              Anticipated d/c is to: Home with [facility]              Anticipated d/c date is: 1 day  Patient currently is not medically stable to d/c. Barriers: Not Clinically Stable-   Code Status :  -  Code Status: Full Code   Family Communication:    NA (patient is alert, awake and coherent)   DVT Prophylaxis  :   - SCDs   SCDs Start: 03/07/22 0446 apixaban (ELIQUIS) tablet 5 mg   Lab Results  Component Value Date   PLT 187 03/08/2022   Inpatient  Medications  Scheduled Meds:  amiodarone  400 mg Oral BID   apixaban  5 mg Oral BID   atorvastatin  80 mg Oral Daily   budesonide (PULMICORT) nebulizer solution  0.5 mg Nebulization BID   Chlorhexidine Gluconate Cloth  6 each Topical Daily   clopidogrel  75 mg Oral Daily   dextromethorphan-guaiFENesin  1 tablet Oral BID   diazepam  2 mg Oral TID   empagliflozin  10 mg Oral Daily   ezetimibe  10 mg Oral Daily   famotidine  20 mg Oral QPM   folic acid  1 mg Oral Daily   furosemide  40 mg Oral Daily   ipratropium-albuterol  3 mL Nebulization Q6H   irbesartan  75 mg Oral Daily   levalbuterol  1.25 mg Nebulization Once   LORazepam  2 mg Intravenous Once   [START ON 03/09/2022] methylPREDNISolone (SOLU-MEDROL) injection  60 mg Intravenous Q12H   metoprolol succinate  25 mg Oral Daily   multivitamin with minerals  1 tablet Oral Daily   nicotine  21 mg Transdermal Daily   oseltamivir  75 mg Oral Daily   pantoprazole  40 mg Oral Daily   rOPINIRole  2 mg Oral QHS   spironolactone  25 mg Oral Daily   thiamine  100 mg Oral Daily   Or   thiamine  100 mg Intravenous Daily   venlafaxine XR  75 mg Oral Q breakfast   Continuous Infusions: PRN Meds:.acetaminophen **OR** acetaminophen, guaiFENesin, ipratropium-albuterol, LORazepam **OR** LORazepam, morphine injection, ondansetron **OR** ondansetron (ZOFRAN) IV, oxyCODONE, rOPINIRole, zolpidem   Anti-infectives (From admission, onward)    Start     Dose/Rate Route Frequency Ordered Stop   03/07/22 1000  oseltamivir (TAMIFLU) capsule 75 mg        75 mg Oral Daily 03/07/22 0340 03/12/22 0959   03/07/22 0215  oseltamivir (TAMIFLU) capsule 75 mg        75 mg Oral  Once 03/07/22 0210 03/07/22 0257         Subjective: [name] today has no fevers, no emesis,  No chest pain,   -Cough and dyspnea on exertion persist -Occasional wheezing -O2 sats down to 86% when he ambulated to the door   Objective: Vitals:   03/08/22 0919 03/08/22  1000 03/08/22 1106 03/08/22 1357  BP:    104/75  Pulse:    74  Resp:  (!) 22  18  Temp:   97.8 F (36.6 C) 97.9 F (36.6 C)  TempSrc:   Oral Oral  SpO2: 95%   93%  Weight:      Height:        Intake/Output Summary (Last 24 hours) at 03/08/2022 1400 Last data filed at 03/08/2022 1303 Gross per 24 hour  Intake 720 ml  Output 1700 ml  Net -980 ml   Filed Weights   03/07/22 0053 03/07/22 0810 03/08/22 0500  Weight: 105.2 kg 111.9 kg 108.7 kg    Physical Exam  Gen:- Awake Alert, dyspnea on exertion,  but no conversational dyspnea HEENT:- [place].AT, No  sclera icterus Nose- [place] 2L/min Neck-Supple Neck,No JVD,.  Lungs-few scattered wheezes, slightly diminished breath sounds CV- S1, S2 normal, regular  Abd-  +ve B.Sounds, Abd Soft, No tenderness,    Extremity/Skin:- No  edema, pedal pulses present  Psych-affect is appropriate, oriented x3 Neuro-generalized weakness, no new focal deficits, no tremors  Data Reviewed: I have personally reviewed following labs and imaging studies  CBC: Recent Labs  Lab 03/07/22 0100 03/07/22 0531 03/08/22 0429  WBC 10.5 10.9* 10.5  NEUTROABS 8.9* 10.3* 9.2*  HGB 14.2 14.3 14.1  HCT 42.2 42.5 43.2  MCV 94.2 94.2 95.8  PLT 181 186 [id number]   Basic Metabolic Panel: Recent Labs  Lab 03/07/22 0100 03/07/22 0531 03/08/22 0429  NA 136 135 137  K 3.9 4.0 4.0  CL 104 103 100  CO2 21* 23 25  GLUCOSE 87 132* 138*  BUN 15 16 29*  CREATININE 1.17 1.11 1.55*  CALCIUM 8.6* 8.5* 8.7*  MG  --  1.9 2.1   GFR: Estimated Creatinine Clearance: 60.1 mL/min (A) (by C-G formula based on SCr of 1.55 mg/dL (H)). Liver Function Tests: Recent Labs  Lab 03/07/22 0531  AST 27  ALT 25  ALKPHOS 64  BILITOT 0.7  PROT 6.9  ALBUMIN 3.7   Recent Results (from the past 240 hour(s))  Resp panel by RT-PCR (RSV, Flu A&B, Covid) Anterior Nasal Swab     Status: Abnormal   Collection Time: 03/07/22  1:05 AM   Specimen: Anterior Nasal Swab  Result Value Ref Range  Status   SARS Coronavirus 2 by RT PCR NEGATIVE NEGATIVE Final    Comment: (NOTE) SARS-CoV-2 target nucleic acids are NOT DETECTED.  The SARS-CoV-2 RNA is generally detectable in upper respiratory specimens during the acute phase of infection. The lowest concentration of SARS-CoV-2 viral copies this assay can detect is 138 copies/mL. A negative result does not preclude SARS-Cov-2 infection and should not be used as the sole basis for treatment or other patient management decisions. A negative result may occur with  improper specimen collection/handling, submission of specimen other than nasopharyngeal swab, presence of viral mutation(s) within the areas targeted by this assay, and inadequate number of viral copies(<138 copies/mL). A negative result must be combined with clinical observations, patient history, and epidemiological information. The expected result is Negative.  Fact Sheet for Patients:  EntrepreneurPulse.com.au  Fact Sheet for Healthcare Providers:  IncredibleEmployment.be  This test is no t yet approved or cleared by the [place] FDA and  has been authorized for detection and/or diagnosis of SARS-CoV-2 by FDA under an Emergency Use Authorization (EUA). This EUA will remain  in effect (meaning this test can be used) for the duration of the COVID-19 declaration under Section 564(b)(1) of the Act, 21 U.S.C.section 360bbb-3(b)(1), unless the authorization is terminated  or revoked sooner.       Influenza A by PCR POSITIVE (A) NEGATIVE Final   Influenza B by PCR NEGATIVE NEGATIVE Final    Comment: (NOTE) The Xpert Xpress SARS-CoV-2/FLU/RSV plus assay is intended as an aid in the diagnosis of influenza from Nasopharyngeal swab specimens and should not be used as a sole basis for treatment. Nasal washings and aspirates are unacceptable for Xpert Xpress SARS-CoV-2/FLU/RSV testing.  Fact Sheet for  Patients: EntrepreneurPulse.com.au  Fact Sheet for Healthcare Providers: IncredibleEmployment.be  This test is not yet approved or cleared by the [place] FDA and has been authorized for detection and/or diagnosis of SARS-CoV-2 by FDA under an Emergency Use Authorization (EUA). This  EUA will remain in effect (meaning this test can be used) for the duration of the COVID-19 declaration under Section 564(b)(1) of the Act, 21 U.S.C. section 360bbb-3(b)(1), unless the authorization is terminated or revoked.     Resp Syncytial Virus by PCR NEGATIVE NEGATIVE Final    Comment: (NOTE) Fact Sheet for Patients: EntrepreneurPulse.com.au  Fact Sheet for Healthcare Providers: IncredibleEmployment.be  This test is not yet approved or cleared by the [place] FDA and has been authorized for detection and/or diagnosis of SARS-CoV-2 by FDA under an Emergency Use Authorization (EUA). This EUA will remain in effect (meaning this test can be used) for the duration of the COVID-19 declaration under Section 564(b)(1) of the Act, 21 U.S.C. section 360bbb-3(b)(1), unless the authorization is terminated or revoked.  Performed at [facility], [address] [place], [place] [address]   MRSA Next Gen by PCR, Nasal     Status: None   Collection Time: 03/07/22  8:07 AM   Specimen: Nasal Mucosa; Nasal Swab  Result Value Ref Range Status   MRSA by PCR Next Gen NOT DETECTED NOT DETECTED Final    Comment: (NOTE) The GeneXpert MRSA Assay (FDA approved for NASAL specimens only), is one component of a comprehensive MRSA colonization surveillance program. It is not intended to diagnose MRSA infection nor to guide or monitor treatment for MRSA infections. Test performance is not FDA approved in patients less than 88 years old. Performed at [facility], [address], [place], [place] [address]     Radiology  Studies: ECHOCARDIOGRAM COMPLETE  Result Date: 03/07/2022    ECHOCARDIOGRAM REPORT   Patient Name:   ANGELL GAINOUS Date of Exam: 03/07/2022 Medical Rec #:  [id number]     Height:       71.5 in Accession #:    [id number]    Weight:       246.7 lb Date of Birth:  Jul 10, 1955      BSA:          2.317 m Patient Age:    65 years      BP:           127/90 mmHg Patient Gender: M             HR:           83 bpm. Exam Location:  [facility] Procedure: 2D Echo, Cardiac Doppler, Color Doppler and Intracardiac            Opacification Agent Indications:    Atrial Fibrillation  History:        Patient has prior history of Echocardiogram examinations, most                 recent 12/28/2021. CHF, CAD and Previous Myocardial Infarction,                 Prior CABG, COPD, Signs/Symptoms:Dyspnea; Risk                 Factors:Hypertension, Diabetes, Dyslipidemia and Current Smoker.                 Flu +, ETOH abuse.  Sonographer:    Wenda Low Referring Phys: [id number] ASIA B [place]  Sonographer Comments: Patient is obese. Image acquisition challenging due to COPD. IMPRESSIONS  1. Left ventricular ejection fraction, by estimation, is 30 to 35%. The left ventricle has moderately decreased function. The left ventricle demonstrates global hypokinesis. The left ventricular internal cavity size was mildly dilated. Left ventricular diastolic parameters are  indeterminate.  2. Right ventricular systolic function is normal. The right ventricular size is normal. There is normal pulmonary artery systolic pressure.  3. Left atrial size was mildly dilated.  4. Right atrial size was mildly dilated.  5. The mitral valve is normal in structure. Trivial mitral valve regurgitation. No evidence of mitral stenosis.  6. The tricuspid valve is abnormal.  7. The aortic valve has an indeterminant number of cusps. Aortic valve regurgitation is mild to moderate. No aortic stenosis is present.  8. Aortic dilatation noted. There is mild dilatation of  the aortic root, measuring 43 mm. FINDINGS  Left Ventricle: Left ventricular ejection fraction, by estimation, is 30 to 35%. The left ventricle has moderately decreased function. The left ventricle demonstrates global hypokinesis. Definity contrast agent was given IV to delineate the left ventricular endocardial borders. The left ventricular internal cavity size was mildly dilated. There is no left ventricular hypertrophy. Left ventricular diastolic parameters are indeterminate. Right Ventricle: The right ventricular size is normal. Right vetricular wall thickness was not well visualized. Right ventricular systolic function is normal. There is normal pulmonary artery systolic pressure. The tricuspid regurgitant velocity is 2.08 m/s, and with an assumed right atrial pressure of 8 mmHg, the estimated right ventricular systolic pressure is [id number] mmHg. Left Atrium: Left atrial size was mildly dilated. Right Atrium: Right atrial size was mildly dilated. Pericardium: The pericardium was not well visualized. Mitral Valve: The mitral valve is normal in structure. Trivial mitral valve regurgitation. No evidence of mitral valve stenosis. MV peak gradient, 2.6 mmHg. The mean mitral valve gradient is 1.0 mmHg. Tricuspid Valve: The tricuspid valve is abnormal. Tricuspid valve regurgitation is mild . No evidence of tricuspid stenosis. Aortic Valve: The aortic valve has an indeterminant number of cusps. Aortic valve regurgitation is mild to moderate. Aortic regurgitation PHT measures 298 msec. No aortic stenosis is present. Aortic valve mean gradient measures 3.0 mmHg. Aortic valve peak gradient measures 6.8 mmHg. Aortic valve area, by VTI measures 2.53 cm. Pulmonic Valve: The pulmonic valve was not well visualized. Pulmonic valve regurgitation is not visualized. No evidence of pulmonic stenosis. Aorta: Ascending aorta poorly visualized. Aortic dilatation noted. There is mild dilatation of the aortic root, measuring 43 mm.  IAS/Shunts: The interatrial septum was not well visualized.  LEFT VENTRICLE PLAX 2D LVIDd:         6.20 cm   Diastology LVIDs:         5.60 cm   LV e' medial:    6.64 cm/s LV PW:         0.80 cm   LV E/e' medial:  12.0 LV IVS:        1.00 cm   LV e' lateral:   8.27 cm/s LVOT diam:     2.00 cm   LV E/e' lateral: 9.6 LV SV:         80 LV SV Index:   35 LVOT Area:     3.14 cm  RIGHT VENTRICLE RV Basal diam:  4.00 cm RV Mid diam:    3.00 cm RV S prime:     6.20 cm/s TAPSE (M-mode): 1.8 cm LEFT ATRIUM             Index        RIGHT ATRIUM           Index LA diam:        4.50 cm 1.94 cm/m   RA Area:     27.50 cm LA Vol (A2C):  96.7 ml 41.73 ml/m  RA Volume:   93.30 ml  40.26 ml/m LA Vol (A4C):   58.3 ml 25.16 ml/m LA Biplane Vol: 82.9 ml 35.77 ml/m  AORTIC VALVE                    PULMONIC VALVE AV Area (Vmax):    2.80 cm     PV Vmax:       0.99 m/s AV Area (Vmean):   2.89 cm     PV Peak grad:  3.9 mmHg AV Area (VTI):     2.53 cm AV Vmax:           130.00 cm/s AV Vmean:          85.900 cm/s AV VTI:            0.317 m AV Peak Grad:      6.8 mmHg AV Mean Grad:      3.0 mmHg LVOT Vmax:         116.00 cm/s LVOT Vmean:        78.900 cm/s LVOT VTI:          0.255 m LVOT/AV VTI ratio: 0.80 AI PHT:            298 msec  AORTA Ao Root diam: 4.30 cm Ao Asc diam:  4.10 cm MITRAL VALVE               TRICUSPID VALVE MV Area (PHT): 3.60 cm    TR Peak grad:   17.3 mmHg MV Area VTI:   3.87 cm    TR Vmax:        208.00 cm/s MV Peak grad:  2.6 mmHg MV Mean grad:  1.0 mmHg    SHUNTS MV Vmax:       0.80 m/s    Systemic VTI:  0.26 m MV Vmean:      48.7 cm/s   Systemic Diam: 2.00 cm MV Decel Time: 211 msec MV E velocity: 79.60 cm/s MV A velocity: 60.30 cm/s MV E/A ratio:  1.32 Carlyle Dolly MD Electronically signed by Carlyle Dolly MD Signature Date/Time: 03/07/2022/11:17:14 AM    Final    DG Chest Port 1 View  Result Date: 03/07/2022 CLINICAL DATA:  Shortness of breath EXAM: PORTABLE CHEST 1 VIEW COMPARISON:  12/28/2021  FINDINGS: Stable cardiomegaly. Sternotomy and CABG. Pulmonary vascular congestion and interstitial thickening suggestive of edema. No focal consolidation, pleural effusion, or pneumothorax. No acute osseous abnormality. IMPRESSION: Cardiomegaly and suspected mild interstitial edema. Electronically Signed   By: Placido Sou M.D.   On: 03/07/2022 01:25     Scheduled Meds:  amiodarone  400 mg Oral BID   apixaban  5 mg Oral BID   atorvastatin  80 mg Oral Daily   budesonide (PULMICORT) nebulizer solution  0.5 mg Nebulization BID   Chlorhexidine Gluconate Cloth  6 each Topical Daily   clopidogrel  75 mg Oral Daily   dextromethorphan-guaiFENesin  1 tablet Oral BID   diazepam  2 mg Oral TID   empagliflozin  10 mg Oral Daily   ezetimibe  10 mg Oral Daily   famotidine  20 mg Oral QPM   folic acid  1 mg Oral Daily   furosemide  40 mg Oral Daily   ipratropium-albuterol  3 mL Nebulization Q6H   irbesartan  75 mg Oral Daily   levalbuterol  1.25 mg Nebulization Once   LORazepam  2 mg Intravenous Once   [START ON 03/09/2022] methylPREDNISolone (SOLU-MEDROL)  injection  60 mg Intravenous Q12H   metoprolol succinate  25 mg Oral Daily   multivitamin with minerals  1 tablet Oral Daily   nicotine  21 mg Transdermal Daily   oseltamivir  75 mg Oral Daily   pantoprazole  40 mg Oral Daily   rOPINIRole  2 mg Oral QHS   spironolactone  25 mg Oral Daily   thiamine  100 mg Oral Daily   Or   thiamine  100 mg Intravenous Daily   venlafaxine XR  75 mg Oral Q breakfast   Continuous Infusions:   LOS: 1 day   Shon Hale M.D on 03/08/2022 at 2:00 PM  Go to [email or web address] - for contact info  [facility] - Office  [phone]  If 7PM-7AM, please contact night-coverage [email or web address] 03/08/2022, 2:00 PM

## 2022-03-08 NOTE — Plan of Care (Signed)
  Problem: Acute Rehab OT Goals (only OT should resolve) Goal: Pt. Will Perform Grooming Flowsheets (Taken 03/08/2022 0943) Pt Will Perform Grooming:  Independently  standing Goal: Pt. Will Perform Lower Body Bathing Flowsheets (Taken 03/08/2022 0943) Pt Will Perform Lower Body Bathing:  Independently  sitting/lateral leans  sit to/from stand Goal: Pt. Will Perform Lower Body Dressing Flowsheets (Taken 03/08/2022 0943) Pt Will Perform Lower Body Dressing:  Independently  sit to/from stand  sitting/lateral leans Goal: Pt. Will Transfer To Toilet Flowsheets (Taken 03/08/2022 [phone]) Pt Will Transfer to Toilet:  Independently  ambulating  Shalom Ware OT, MOT

## 2022-03-08 NOTE — Evaluation (Signed)
Physical Therapy Evaluation Patient Details Name: Camarion Dillow MRN: [id number] DOB: [birth year] Today's Date: 03/08/2022  History of Present Illness  Ambrose Dona is a 66 y.o. male with medical history significant of coronary artery disease with myocardial infarction, anxiety, depression, GERD, hypertension, hyperlipidemia, systolic CHF, COPD, current smoker, and more presents ED with chief complaint of dyspnea.  Started Christmas Eve.  Got better over Christmas day, then and worse Christmas night.  Reports a productive cough with sputum that he cannot describe because he swallows it.  He does not have chest pain.  Patient reports he does have myalgias.  He thought he was going to die per his report.  His albuterol helps him some but does not relieve his symptoms.  He does not feel like it did when he had a heart attack.  Patient has no nausea, vomiting, diaphoresis.  He denies any leg swelling although it is there on exam.  He does not wear oxygen at home.  He came in on 15 L nonrebreather and has been weaned down to 5 L nasal cannula.  Patient refused BiPAP.  Patient has no other complaints at this time.   Clinical Impression  Patient demonstrates fair/good return for sitting up at bedside with [facility] slightly raised, initially unsteady on feet without use of an AD, required use of RW for safety, but after sitting up for a few minutes able to ambulate in room without use of an AD without loss of balance.  Patient limited for ambulation mostly due dislodged IV from LUE with bleeding - nursing staff aware and dressed IV site to prevent further bleeding.  Patient able to ambulate back to bedside without loss of balance and tolerated sitting up in chair after therapy.  Patient will benefit from continued skilled physical therapy in hospital and recommended venue below to increase strength, balance, endurance for safe ADLs and gait.        Recommendations for follow up therapy are one component of a  multi-disciplinary discharge planning process, led by the attending physician.  Recommendations may be updated based on patient status, additional functional criteria and insurance authorization.  Follow Up Recommendations Home health PT      Assistance Recommended at Discharge Set up Supervision/Assistance  Patient can return home with the following  A little help with walking and/or transfers;A little help with bathing/dressing/bathroom;Help with stairs or ramp for entrance;Assistance with cooking/housework    Equipment Recommendations Rolling walker (2 wheels)  Recommendations for Other Services       Functional Status Assessment Patient has had a recent decline in their functional status and demonstrates the ability to make significant improvements in function in a reasonable and predictable amount of time.     Precautions / Restrictions Precautions Precautions: Fall Restrictions Weight Bearing Restrictions: No      Mobility  Bed Mobility Overal bed mobility: Modified Independent             General bed mobility comments: HOB slightly elevated due to poor tolerance for lying flat    Transfers Overall transfer level: Needs assistance Equipment used: Rolling walker (2 wheels), None Transfers: Sit to/from Stand, Bed to chair/wheelchair/BSC Sit to Stand: Min guard   Step pivot transfers: Min guard       General transfer comment: slightly labored movement having to lean on armrest of chair support during transfers without AD, safer using RW    Ambulation/Gait Ambulation/Gait assistance: Min guard Gait Distance (Feet): 25 Feet Assistive device: None, 1 person hand held assist  Gait Pattern/deviations: Decreased step length - right, Decreased step length - left, Decreased stride length Gait velocity: decreased     General Gait Details: slow labored cadence without use of AD, no loss of balance, SpO2 dropped from 94% to 87% on room air, limited for ambulation  mostly due to bleeding from LUE at site of IV insertion  Stairs            Wheelchair Mobility    Modified Rankin (Stroke Patients Only)       Balance Overall balance assessment: Mild deficits observed, not formally tested, Needs assistance Sitting-balance support: Feet supported, No upper extremity supported Sitting balance-Leahy Scale: Good Sitting balance - Comments: seated EOB   Standing balance support: During functional activity, No upper extremity supported Standing balance-Leahy Scale: Fair Standing balance comment: fair/good using RW                             Pertinent Vitals/Pain Pain Assessment Pain Assessment: Faces Faces Pain Scale: Hurts a little bit Pain Location: chest pain Pain Descriptors / Indicators: Grimacing Pain Intervention(s): Limited activity within patient's tolerance, Monitored during session, Repositioned    Home Living Family/patient expects to be discharged to:: Private residence Living Arrangements: Alone Available Help at Discharge: Friend(s);Family;Available PRN/intermittently Type of Home: Apartment Home Access: Stairs to enter Entrance Stairs-Rails: Left Entrance Stairs-Number of Steps: flight (17)   Home Layout: One level Home Equipment: Cane - quad;Shower seat Additional Comments: Pt reports his daughter was staying with him but not likely anymore. Pt has a friend who helps.    Prior Function Prior Level of Function : Independent/Modified Independent             Mobility Comments: ambulates without use of a device ADLs Comments: Daugher assist with groceries sometimes. Other times pt walks to the store and gets groceries. Independent ADL.     Hand Dominance   Dominant Hand: Right    Extremity/Trunk Assessment   Upper Extremity Assessment Upper Extremity Assessment: Defer to OT evaluation    Lower Extremity Assessment Lower Extremity Assessment: Generalized weakness    Cervical / Trunk  Assessment Cervical / Trunk Assessment: Normal  Communication   Communication: No difficulties  Cognition Arousal/Alertness: Awake/alert Behavior During Therapy: WFL for tasks assessed/performed Overall Cognitive Status: Within Functional Limits for tasks assessed                                          General Comments      Exercises     Assessment/Plan    PT Assessment Patient needs continued PT services  PT Problem List Decreased strength;Decreased activity tolerance;Decreased balance;Decreased mobility       PT Treatment Interventions DME instruction;Gait training;Stair training;Functional mobility training;Therapeutic activities;Therapeutic exercise;Patient/family education;Balance training    PT Goals (Current goals can be found in the Care Plan section)  Acute Rehab PT Goals Patient Stated Goal: return home with family to assist PT Goal Formulation: With patient Time For Goal Achievement: 03/15/22 Potential to Achieve Goals: Good    Frequency Min 3X/week     Co-evaluation PT/OT/SLP Co-Evaluation/Treatment: Yes Reason for Co-Treatment: To address functional/ADL transfers PT goals addressed during session: Mobility/safety with mobility;Balance;Proper use of DME OT goals addressed during session: ADL's and self-care       AM-PAC PT "6 Clicks" Mobility  Outcome Measure Help needed turning from  your back to your side while in a flat bed without using bedrails?: None Help needed moving from lying on your back to sitting on the side of a flat bed without using bedrails?: A Little Help needed moving to and from a bed to a chair (including a wheelchair)?: A Little Help needed standing up from a chair using your arms (e.g., wheelchair or bedside chair)?: A Little Help needed to walk in hospital room?: A Little Help needed climbing 3-5 steps with a railing? : A Lot 6 Click Score: 18    End of Session   Activity Tolerance: Patient tolerated  treatment well;Patient limited by fatigue Patient left: in chair;with call bell/phone within reach Nurse Communication: Mobility status PT Visit Diagnosis: Unsteadiness on feet (R26.81);Other abnormalities of gait and mobility (R26.89);Muscle weakness (generalized) (M62.81)    Time: [id number] PT Time Calculation (min) (ACUTE ONLY): 22 min   Charges:   PT Evaluation $PT Eval Moderate Complexity: 1 Mod PT Treatments $Therapeutic Activity: 8-22 mins        12:10 PM, 03/08/22 Lonell Grandchild, MPT Physical Therapist with [facility] [phone] office [phone] mobile phone

## 2022-03-08 NOTE — Evaluation (Signed)
Occupational Therapy Evaluation Patient Details Name: Kevin Smith MRN: [id number] DOB: 01/03/1956 Today's Date: 03/08/2022   History of Present Illness Kevin Smith is a 66 y.o. male with medical history significant of coronary artery disease with myocardial infarction, anxiety, depression, GERD, hypertension, hyperlipidemia, systolic CHF, COPD, current smoker, and more presents ED with chief complaint of dyspnea.  Started Christmas Eve.  Got better over Christmas day, then and worse Christmas night.  Reports a productive cough with sputum that he cannot describe because he swallows it.  He does not have chest pain.  Patient reports he does have myalgias.  He thought he was going to die per his report.  His albuterol helps him some but does not relieve his symptoms.  He does not feel like it did when he had a heart attack.  Patient has no nausea, vomiting, diaphoresis.  He denies any leg swelling although it is there on exam.  He does not wear oxygen at home.  He came in on 15 L nonrebreather and has been weaned down to 5 L nasal cannula.  Patient refused BiPAP.  Patient has no other complaints at this time. (per DO)   Clinical Impression   Pt agreeable to OT and PT co-evaluation. Pt demonstrates good seated ADL's with ability to don shoes seated at EOB. Mod I for bed mobility. When standing pt is unsteady without AD with one instance of loss of balance while marching in place. Pt was able to ambulate to the hall and return to chair with min G assist.  Pt supplemental O2 was removed and pt desaturated to a low of ~88% SpO2 but was consistently able to get back to above 90% SpO2 without return of supplemental oxygen. Pt was left in chair with call bell within reach. Pt will benefit from continued OT in the hospital and recommended venue below to increase strength, balance, and endurance for safe ADL's.        Recommendations for follow up therapy are one component of a multi-disciplinary discharge  planning process, led by the attending physician.  Recommendations may be updated based on patient status, additional functional criteria and insurance authorization.   Follow Up Recommendations  Home health OT     Assistance Recommended at Discharge Intermittent Supervision/Assistance  Patient can return home with the following A little help with walking and/or transfers;A little help with bathing/dressing/bathroom;Assistance with cooking/housework;Assist for transportation;Help with stairs or ramp for entrance    Functional Status Assessment  Patient has had a recent decline in their functional status and demonstrates the ability to make significant improvements in function in a reasonable and predictable amount of time.  Equipment Recommendations  None recommended by OT    Recommendations for Other Services       Precautions / Restrictions Precautions Precautions: Fall Restrictions Weight Bearing Restrictions: No      Mobility Bed Mobility Overal bed mobility: Modified Independent             General bed mobility comments: HOB elevated.    Transfers Overall transfer level: Needs assistance   Transfers: Sit to/from Stand, Bed to chair/wheelchair/BSC Sit to Stand: Min guard     Step pivot transfers: Min guard     General transfer comment: EOB to chair and chair to ambulation to dorr and return to chair. No AD. Unsteady in standing with use of gait belt for safety.      Balance Overall balance assessment: Needs assistance Sitting-balance support: Feet supported, Bilateral upper extremity supported Sitting balance-Leahy  Scale: Good Sitting balance - Comments: seated EOB   Standing balance support: No upper extremity supported, During functional activity Standing balance-Leahy Scale: Fair Standing balance comment: poor to fair without AD                           ADL either performed or assessed with clinical judgement   ADL Overall ADL's :  Needs assistance/impaired     Grooming: Min guard;Standing       Lower Body Bathing: Min guard;Minimal assistance;Sitting/lateral leans       Lower Body Dressing: Set up;Min guard;Sitting/lateral leans Lower Body Dressing Details (indicate cue type and reason): Pt able to slide on shoes seated at EOB with extended time. Toilet Transfer: Min guard;Ambulation Toilet Transfer Details (indicate cue type and reason): Simualted via EOB to chair transfer without AD. Toileting- Clothing Manipulation and Hygiene: Set up;Sitting/lateral lean       Functional mobility during ADLs: Min guard General ADL Comments: Pt unsteady during ambulation with minor improvements with continued time standing.     Vision Baseline Vision/History: 1 Wears glasses Ability to See in Adequate Light: 0 Adequate Patient Visual Report: No change from baseline Vision Assessment?: No apparent visual deficits                Pertinent Vitals/Pain Pain Assessment Pain Assessment: Faces Faces Pain Scale: Hurts a little bit Pain Location: chest pain Pain Descriptors / Indicators: Grimacing Pain Intervention(s): Limited activity within patient's tolerance, Monitored during session     Hand Dominance Right   Extremity/Trunk Assessment Upper Extremity Assessment Upper Extremity Assessment: Overall WFL for tasks assessed   Lower Extremity Assessment Lower Extremity Assessment: Defer to PT evaluation   Cervical / Trunk Assessment Cervical / Trunk Assessment: Normal   Communication Communication Communication: No difficulties   Cognition Arousal/Alertness: Awake/alert Behavior During Therapy: WFL for tasks assessed/performed Overall Cognitive Status: Within Functional Limits for tasks assessed                                                        Home Living Family/patient expects to be discharged to:: Private residence Living Arrangements: Alone Available Help at Discharge:  Friend(s);Family;Available PRN/intermittently Type of Home: Apartment Home Access: Stairs to enter Entrance Stairs-Number of Steps: flight (17) Entrance Stairs-Rails: Left Home Layout: One level     Bathroom Shower/Tub: Producer, television/film/video: Standard Bathroom Accessibility: Yes   Home Equipment: Cane - quad;Shower seat   Additional Comments: Pt reports his daughter was staying with him but not likely anymore. Pt has a friend who helps.      Prior Functioning/Environment Prior Level of Function : Independent/Modified Independent             Mobility Comments: ambulates without use of a device ADLs Comments: Daugher assist with groceries sometimes. Other times pt walks to the store and gets groceries. Independent ADL.        OT Problem List: Cardiopulmonary status limiting activity;Decreased activity tolerance;Impaired balance (sitting and/or standing)      OT Treatment/Interventions: Self-care/ADL training;Therapeutic exercise;Therapeutic activities;Patient/family education;Balance training    OT Goals(Current goals can be found in the care plan section) Acute Rehab OT Goals Patient Stated Goal: open to return home OT Goal Formulation: With patient Time For Goal Achievement: 03/22/22 Potential  to Achieve Goals: Good  OT Frequency: Min 1X/week    Co-evaluation PT/OT/SLP Co-Evaluation/Treatment: Yes Reason for Co-Treatment: To address functional/ADL transfers   OT goals addressed during session: ADL's and self-care                       End of Session Equipment Utilized During Treatment: Gait belt;Oxygen Nurse Communication: Other (comment) (Notified of pt's IV coming out.)  Activity Tolerance: Patient tolerated treatment well Patient left: in chair;with call bell/phone within reach  OT Visit Diagnosis: Unsteadiness on feet (R26.81);Other abnormalities of gait and mobility (R26.89);Muscle weakness (generalized) (M62.81)                Time:  4431-5400 OT Time Calculation (min): 21 min Charges:  OT General Charges $OT Visit: 1 Visit OT Evaluation $OT Eval Low Complexity: 1 Low  Ekaterini Capitano OT, MOT  Danie Chandler 03/08/2022, 9:40 AM

## 2022-03-08 NOTE — Progress Notes (Signed)
Cardiologist:  Branch  Subjective:  Denies SSCP, palpitations or Dyspnea Maintaining NSR   Objective:  Vitals:   03/08/22 0157 03/08/22 0400 03/08/22 0500 03/08/22 0736  BP:      Pulse:    72  Resp:      Temp:  97.7 F (36.5 C)  97.8 F (36.6 C)  TempSrc:  Oral  Oral  SpO2: 94%   97%  Weight:   108.7 kg   Height:        Intake/Output from previous day:  Intake/Output Summary (Last 24 hours) at 03/08/2022 0810 Last data filed at 03/08/2022 0100 Gross per 24 hour  Intake 581.11 ml  Output 1500 ml  Net -918.89 ml    Physical Exam: Large scare over right forehead Lungs decreased BS base No murmur  Abdomen benign  Trace edema  Lab Results: Basic Metabolic Panel: Recent Labs    03/07/22 0531 03/08/22 0429  NA 135 137  K 4.0 4.0  CL 103 100  CO2 23 25  GLUCOSE 132* 138*  BUN 16 29*  CREATININE 1.11 1.55*  CALCIUM 8.5* 8.7*  MG 1.9 2.1   Liver Function Tests: Recent Labs    03/07/22 0531  AST 27  ALT 25  ALKPHOS 64  BILITOT 0.7  PROT 6.9  ALBUMIN 3.7   No results for input(s): "LIPASE", "AMYLASE" in the last 72 hours. CBC: Recent Labs    03/07/22 0531 03/08/22 0429  WBC 10.9* 10.5  NEUTROABS 10.3* 9.2*  HGB 14.3 14.1  HCT 42.5 43.2  MCV 94.2 95.8  PLT 186 187    Thyroid Function Tests: Recent Labs    03/07/22 0531  TSH 0.930   Anemia Panel: No results for input(s): "VITAMINB12", "FOLATE", "FERRITIN", "TIBC", "IRON", "RETICCTPCT" in the last 72 hours.  Imaging: ECHOCARDIOGRAM COMPLETE  Result Date: 03/07/2022    ECHOCARDIOGRAM REPORT   Patient Name:   Kevin Smith Date of Exam: 03/07/2022 Medical Rec #:  [id number]     Height:       71.5 in Accession #:    [id number]    Weight:       246.7 lb Date of Birth:  October 13, 1955      BSA:          2.317 m Patient Age:    66 years      BP:           127/90 mmHg Patient Gender: M             HR:           83 bpm. [facility] Procedure: 2D Echo, Cardiac Doppler, Color Doppler and  Intracardiac            Opacification Agent Indications:    Atrial Fibrillation  History:        Patient has prior history of Echocardiogram examinations, most                 recent 12/28/2021. CHF, CAD and Previous Myocardial Infarction,                 Prior CABG, COPD, Signs/Symptoms:Dyspnea; Risk                 Factors:Hypertension, Diabetes, Dyslipidemia and Current Smoker.                 Flu +, ETOH abuse.  Sonographer:    Mikki Harbor Referring Phys: [id number] ASIA B ZIERLE-GHOSH  Sonographer Comments: Patient is obese.  Image acquisition challenging due to COPD. IMPRESSIONS  1. Left ventricular ejection fraction, by estimation, is 30 to 35%. The left ventricle has moderately decreased function. The left ventricle demonstrates global hypokinesis. The left ventricular internal cavity size was mildly dilated. Left ventricular diastolic parameters are indeterminate.  2. Right ventricular systolic function is normal. The right ventricular size is normal. There is normal pulmonary artery systolic pressure.  3. Left atrial size was mildly dilated.  4. Right atrial size was mildly dilated.  5. The mitral valve is normal in structure. Trivial mitral valve regurgitation. No evidence of mitral stenosis.  6. The tricuspid valve is abnormal.  7. The aortic valve has an indeterminant number of cusps. Aortic valve regurgitation is mild to moderate. No aortic stenosis is present.  8. Aortic dilatation noted. There is mild dilatation of the aortic root, measuring 43 mm. FINDINGS  Left Ventricle: Left ventricular ejection fraction, by estimation, is 30 to 35%. The left ventricle has moderately decreased function. The left ventricle demonstrates global hypokinesis. Definity contrast agent was given IV to delineate the left ventricular endocardial borders. The left ventricular internal cavity size was mildly dilated. There is no left ventricular hypertrophy. Left ventricular diastolic parameters are indeterminate. Right  Ventricle: The right ventricular size is normal. Right vetricular wall thickness was not well visualized. Right ventricular systolic function is normal. There is normal pulmonary artery systolic pressure. The tricuspid regurgitant velocity is 2.08 m/s, and with an assumed right atrial pressure of 8 mmHg, the estimated right ventricular systolic pressure is 25.3 mmHg. Left Atrium: Left atrial size was mildly dilated. Right Atrium: Right atrial size was mildly dilated. Pericardium: The pericardium was not well visualized. Mitral Valve: The mitral valve is normal in structure. Trivial mitral valve regurgitation. No evidence of mitral valve stenosis. MV peak gradient, 2.6 mmHg. The mean mitral valve gradient is 1.0 mmHg. Tricuspid Valve: The tricuspid valve is abnormal. Tricuspid valve regurgitation is mild . No evidence of tricuspid stenosis. Aortic Valve: The aortic valve has an indeterminant number of cusps. Aortic valve regurgitation is mild to moderate. Aortic regurgitation PHT measures 298 msec. No aortic stenosis is present. Aortic valve mean gradient measures 3.0 mmHg. Aortic valve peak gradient measures 6.8 mmHg. Aortic valve area, by VTI measures 2.53 cm. Pulmonic Valve: The pulmonic valve was not well visualized. Pulmonic valve regurgitation is not visualized. No evidence of pulmonic stenosis. Aorta: Ascending aorta poorly visualized. Aortic dilatation noted. There is mild dilatation of the aortic root, measuring 43 mm. IAS/Shunts: The interatrial septum was not well visualized.  LEFT VENTRICLE PLAX 2D LVIDd:         6.20 cm   Diastology LVIDs:         5.60 cm   LV e' medial:    6.64 cm/s LV PW:         0.80 cm   LV E/e' medial:  12.0 LV IVS:        1.00 cm   LV e' lateral:   8.27 cm/s LVOT diam:     2.00 cm   LV E/e' lateral: 9.6 LV SV:         80 LV SV Index:   35 LVOT Area:     3.14 cm  RIGHT VENTRICLE RV Basal diam:  4.00 cm RV Mid diam:    3.00 cm RV S prime:     6.20 cm/s TAPSE (M-mode): 1.8 cm LEFT  ATRIUM             Index  RIGHT ATRIUM           Index LA diam:        4.50 cm 1.94 cm/m   RA Area:     27.50 cm LA Vol (A2C):   96.7 ml 41.73 ml/m  RA Volume:   93.30 ml  40.26 ml/m LA Vol (A4C):   58.3 ml 25.16 ml/m LA Biplane Vol: 82.9 ml 35.77 ml/m  AORTIC VALVE                    PULMONIC VALVE AV Area (Vmax):    2.80 cm     PV Vmax:       0.99 m/s AV Area (Vmean):   2.89 cm     PV Peak grad:  3.9 mmHg AV Area (VTI):     2.53 cm AV Vmax:           130.00 cm/s AV Vmean:          85.900 cm/s AV VTI:            0.317 m AV Peak Grad:      6.8 mmHg AV Mean Grad:      3.0 mmHg LVOT Vmax:         116.00 cm/s LVOT Vmean:        78.900 cm/s LVOT VTI:          0.255 m LVOT/AV VTI ratio: 0.80 AI PHT:            298 msec  AORTA Ao Root diam: 4.30 cm Ao Asc diam:  4.10 cm MITRAL VALVE               TRICUSPID VALVE MV Area (PHT): 3.60 cm    TR Peak grad:   17.3 mmHg MV Area VTI:   3.87 cm    TR Vmax:        208.00 cm/s MV Peak grad:  2.6 mmHg MV Mean grad:  1.0 mmHg    SHUNTS MV Vmax:       0.80 m/s    Systemic VTI:  0.26 m MV Vmean:      48.7 cm/s   Systemic Diam: 2.00 cm MV Decel Time: 211 msec MV E velocity: 79.60 cm/s MV A velocity: 60.30 cm/s MV E/A ratio:  1.32 Carlyle Dolly MD Electronically signed by Carlyle Dolly MD Signature Date/Time: 03/07/2022/11:17:14 AM    Final    DG Chest Port 1 View  Result Date: 03/07/2022 CLINICAL DATA:  Shortness of breath EXAM: PORTABLE CHEST 1 VIEW COMPARISON:  12/28/2021 FINDINGS: Stable cardiomegaly. Sternotomy and CABG. Pulmonary vascular congestion and interstitial thickening suggestive of edema. No focal consolidation, pleural effusion, or pneumothorax. No acute osseous abnormality. IMPRESSION: Cardiomegaly and suspected mild interstitial edema. Electronically Signed   By: Placido Sou M.D.   On: 03/07/2022 01:25    Cardiac Studies:  ECG:  Orders placed or performed during the hospital encounter of 03/07/22   EKG 12-Lead   EKG 12-Lead   EKG      Telemetry:  NSR rates 60-70's   Echo: EF stable 30-35% mild / mod AR Aorta 4.3   Medications:    amiodarone  400 mg Oral BID   apixaban  5 mg Oral BID   atorvastatin  80 mg Oral Daily   budesonide (PULMICORT) nebulizer solution  0.5 mg Nebulization BID   Chlorhexidine Gluconate Cloth  6 each Topical Daily   clopidogrel  75 mg Oral Daily   empagliflozin  10 mg  Oral Daily   ezetimibe  10 mg Oral Daily   famotidine  20 mg Oral QPM   furosemide  40 mg Intravenous BID   ipratropium-albuterol  3 mL Nebulization Q6H   irbesartan  75 mg Oral Daily   levalbuterol  1.25 mg Nebulization Once   methylPREDNISolone (SOLU-MEDROL) injection  125 mg Intravenous Daily   metoprolol succinate  25 mg Oral Daily   nicotine  21 mg Transdermal Daily   oseltamivir  75 mg Oral Daily   pantoprazole  40 mg Oral Daily   rOPINIRole  2 mg Oral QHS   spironolactone  25 mg Oral Daily   venlafaxine XR  75 mg Oral Q breakfast      Assessment/Plan:   PAF;  resolved on amiodarone On d/c decrease oral dose to 200 mg bid for a week then 200 mg daily con eliquis follow Cr  CHADVASSC 4 CAD:  No angina when in NSR at slower rates CAth 12/27/21 with PCI of mid circumflex continued distal LCX occlusion Known occlusions of SVG to OM and RCA Patent LIMA to LAD Limited options for repeat interventions Given need for eliquis use Plavix only for CAD  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

## 2022-03-08 NOTE — TOC Progression Note (Signed)
Transition of Care [facility]) - Progression Note    Patient Details  Name: Kevin Smith MRN: [id number] Date of Birth: 12/15/1955  [facility] CM/SW Contact  Elliot Gault, LCSW Phone Number: 03/08/2022, 2:08 PM  Clinical Narrative:     TOC following. Spoke with pt today to review dc planning. Updated pt on options for pharmacy providers in his area that deliver. Pt requests that his pharmacy be changed to [facility]. Updated this in Epic and notified [facility] so they can request transfer of pt's current Rx's from [place].  Pt asking about residential SA counseling at dc. Reviewed options and pt states he will make phone calls to see if he can get in anywhere.  TOC notified by [facility] rep that they are active with pt for [facility] services so if pt does return directly home at dc, they will need updated orders for PT/OT/SW.  TOC will follow.  Expected Discharge Plan: Home w Home Health Services Barriers to Discharge: Continued Medical Work up  Expected Discharge Plan and Services In-house Referral: Clinical Social Work   Post Acute Care Choice: Home Health Living arrangements for the past 2 months: Apartment                                       Social Determinants of Health (SDOH) Interventions SDOH Screenings   Food Insecurity: Food Insecurity Present (03/07/2022)  Housing: Low Risk  (03/07/2022)  Transportation Needs: Unmet Transportation Needs (03/07/2022)  Utilities: Not At Risk (03/07/2022)  Alcohol Screen: Medium Risk (11/25/2020)  Depression (PHQ2-9): High Risk (01/23/2022)  Financial Resource Strain: Medium Risk (11/25/2020)  Physical Activity: Insufficiently Active (11/25/2020)  Social Connections: Socially Isolated (11/25/2020)  Stress: No Stress Concern Present (11/25/2020)  Tobacco Use: High Risk (03/07/2022)    Readmission Risk Interventions    03/07/2022   12:41 PM  Readmission Risk Prevention Plan  Transportation  Screening Complete  Home Care Screening Complete  Medication Review (RN CM) Complete

## 2022-03-09 LAB — CBC WITH DIFFERENTIAL/PLATELET
Abs Immature Granulocytes: 0.06 10*3/uL (ref 0.00–0.07)
Basophils Absolute: 0 10*3/uL (ref 0.0–0.1)
Basophils Relative: 0 %
Eosinophils Absolute: 0 10*3/uL (ref 0.0–0.5)
Eosinophils Relative: 0 %
HCT: 41.9 % (ref 39.0–52.0)
Hemoglobin: 13.7 g/dL (ref 13.0–17.0)
Immature Granulocytes: 1 %
Lymphocytes Relative: 7 %
Lymphs Abs: 0.8 10*3/uL (ref 0.7–4.0)
MCH: 31.3 pg (ref 26.0–34.0)
MCHC: 32.7 g/dL (ref 30.0–36.0)
MCV: 95.7 fL (ref 80.0–100.0)
Monocytes Absolute: 0.8 10*3/uL (ref 0.1–1.0)
Monocytes Relative: 7 %
Neutro Abs: 10.9 10*3/uL — ABNORMAL HIGH (ref 1.7–7.7)
Neutrophils Relative %: 85 %
Platelets: 198 10*3/uL (ref 150–400)
RBC: 4.38 MIL/uL (ref 4.22–5.81)
RDW: 14.6 % (ref 11.5–15.5)
WBC: 12.6 10*3/uL — ABNORMAL HIGH (ref 4.0–10.5)
nRBC: 0 % (ref 0.0–0.2)

## 2022-03-09 LAB — BASIC METABOLIC PANEL
Anion gap: 11 (ref 5–15)
BUN: 38 mg/dL — ABNORMAL HIGH (ref 8–23)
CO2: 24 mmol/L (ref 22–32)
Calcium: 8.2 mg/dL — ABNORMAL LOW (ref 8.9–10.3)
Chloride: 98 mmol/L (ref 98–111)
Creatinine, Ser: 1.49 mg/dL — ABNORMAL HIGH (ref 0.61–1.24)
GFR, Estimated: 52 mL/min — ABNORMAL LOW (ref >60)
Glucose, Bld: 119 mg/dL — ABNORMAL HIGH (ref 70–99)
Potassium: 4.3 mmol/L (ref 3.5–5.1)
Sodium: 133 mmol/L — ABNORMAL LOW (ref 135–145)

## 2022-03-09 LAB — GLUCOSE, CAPILLARY
Glucose-Capillary: 120 mg/dL — ABNORMAL HIGH (ref 70–99)
Glucose-Capillary: 142 mg/dL — ABNORMAL HIGH (ref 70–99)
Glucose-Capillary: 97 mg/dL (ref 70–99)

## 2022-03-09 LAB — MAGNESIUM: Magnesium: 2.2 mg/dL (ref 1.7–2.4)

## 2022-03-09 MED ORDER — OSELTAMIVIR PHOSPHATE 75 MG PO CAPS: 75.0000 mg | ORAL_CAPSULE | Freq: Every day | ORAL | 0 refills | Status: AC

## 2022-03-09 MED ORDER — NICOTINE 21 MG/24HR TD PT24: 21.0000 mg | MEDICATED_PATCH | Freq: Every day | TRANSDERMAL | 0 refills | Status: DC

## 2022-03-09 MED ORDER — BREZTRI AEROSPHERE 160-9-4.8 MCG/ACT IN AERO: 2.0000 | INHALATION_SPRAY | Freq: Two times a day (BID) | RESPIRATORY_TRACT | 5 refills | Status: DC

## 2022-03-09 MED ORDER — PREDNISONE 20 MG PO TABS: 40.0000 mg | ORAL_TABLET | Freq: Every day | ORAL | 0 refills | Status: AC

## 2022-03-09 MED ORDER — VENTOLIN HFA 108 (90 BASE) MCG/ACT IN AERS: 2.0000 | INHALATION_SPRAY | Freq: Four times a day (QID) | RESPIRATORY_TRACT | 2 refills | Status: DC | PRN

## 2022-03-09 MED ORDER — VITAMIN B-1 100 MG PO TABS: 100.0000 mg | ORAL_TABLET | Freq: Every day | ORAL | 5 refills | Status: DC

## 2022-03-09 MED ORDER — SPIRONOLACTONE 25 MG PO TABS: 25.0000 mg | ORAL_TABLET | Freq: Every day | ORAL | 6 refills | Status: DC

## 2022-03-09 MED ORDER — CLOPIDOGREL BISULFATE 75 MG PO TABS: 75.0000 mg | ORAL_TABLET | Freq: Every day | ORAL | 3 refills | Status: DC

## 2022-03-09 MED ORDER — EMPAGLIFLOZIN 10 MG PO TABS: 10.0000 mg | ORAL_TABLET | Freq: Every day | ORAL | 6 refills | Status: DC

## 2022-03-09 MED ORDER — FUROSEMIDE 40 MG PO TABS: 40.0000 mg | ORAL_TABLET | Freq: Every day | ORAL | 3 refills | Status: DC

## 2022-03-09 MED ORDER — METOPROLOL SUCCINATE ER 50 MG PO TB24: 50.0000 mg | ORAL_TABLET | Freq: Every day | ORAL | 11 refills | Status: DC

## 2022-03-09 MED ORDER — APIXABAN 5 MG PO TABS: 5.0000 mg | ORAL_TABLET | Freq: Two times a day (BID) | ORAL | 11 refills | Status: DC

## 2022-03-09 MED ORDER — FOLIC ACID 1 MG PO TABS: 1.0000 mg | ORAL_TABLET | Freq: Every day | ORAL | 5 refills | Status: DC

## 2022-03-09 MED ORDER — AMIODARONE HCL 200 MG PO TABS: 200.0000 mg | ORAL_TABLET | Freq: Every day | ORAL | 2 refills | Status: DC

## 2022-03-09 MED ORDER — GUAIFENESIN ER 600 MG PO TB12: 600.0000 mg | ORAL_TABLET | Freq: Two times a day (BID) | ORAL | 0 refills | Status: DC

## 2022-03-09 MED ORDER — LACTULOSE 10 GM/15ML PO SOLN: 30.0000 g | Freq: Once | ORAL | Status: DC

## 2022-03-09 MED ORDER — ALBUTEROL SULFATE (2.5 MG/3ML) 0.083% IN NEBU: 2.5000 mg | INHALATION_SOLUTION | RESPIRATORY_TRACT | 12 refills | Status: DC | PRN

## 2022-03-09 MED ORDER — VALSARTAN 80 MG PO TABS: 80.0000 mg | ORAL_TABLET | Freq: Every day | ORAL | 11 refills | Status: DC

## 2022-03-09 NOTE — Discharge Summary (Signed)
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 SARS-CoV-2/FLU/RSV testing.  Fact Sheet for Patients: EntrepreneurPulse.com.au  Fact Sheet for Healthcare Providers: IncredibleEmployment.be  This test is not yet approved or cleared by the [place] FDA and has been authorized for detection and/or diagnosis of SARS-CoV-2 by FDA under an Emergency Use Authorization (EUA). This EUA will remain in effect (meaning this test can be used) for the duration of the COVID-19 declaration under Section 564(b)(1) of the Act, 21 U.S.C. section 360bbb-3(b)(1), unless the authorization is terminated or revoked.     Resp Syncytial Virus by PCR NEGATIVE NEGATIVE Final    Comment: (NOTE) Fact Sheet for Patients: EntrepreneurPulse.com.au  Fact  Sheet for Healthcare Providers: IncredibleEmployment.be  This test is not yet approved or cleared by the [place] FDA and has been authorized for detection and/or diagnosis of SARS-CoV-2 by FDA under an Emergency Use Authorization (EUA). This EUA will remain in effect (meaning this test can be used) for the duration of the COVID-19 declaration under Section 564(b)(1) of the Act, 21 U.S.C. section 360bbb-3(b)(1), unless the authorization is terminated or  revoked.  Performed at [facility], [address], [place], [place] [address]   MRSA Next Gen by PCR, Nasal     Status: None   Collection Time: 03/07/22  8:07 AM   Specimen: Nasal Mucosa; Nasal Swab  Result Value Ref Range Status   MRSA by PCR Next Gen NOT DETECTED NOT DETECTED Final    Comment: (NOTE) The GeneXpert MRSA Assay (FDA approved for NASAL specimens only), is one component of a comprehensive MRSA colonization surveillance program. It is not intended to diagnose MRSA infection nor to guide or monitor treatment for MRSA infections. Test performance is not FDA approved in patients less than 44 years old. Performed at [facility], [address] [place], [place] [address]    Today   Subjective    Rueben Kassim today has no new concerns  No fever  Or chills   No Nausea, Vomiting or Diarrhea Respiratory status improved significantly -Weaned off oxygen, hypoxia resolved -No chest pains        Patient has been seen and examined prior to discharge   Objective   Blood pressure 130/84, pulse 73, temperature 98.4 F (36.9 C), temperature source Oral, resp. rate 18, height 5' 11.5" (1.816 m), weight 108.7 kg, SpO2 94 %.   Intake/Output Summary (Last 24 hours) at 03/09/2022 1228 Last data filed at 03/09/2022 0900 Gross per 24 hour  Intake 1080 ml  Output --  Net 1080 ml    Exam Gen:- Awake Alert, no acute distress  HEENT:- [place].AT, No sclera icterus Neck-Supple Neck,No JVD,.   Lungs-improved air movement, no wheezing  CV- S1, S2 normal, irregularly irregular Abd-  +ve B.Sounds, Abd Soft, No tenderness,    Extremity/Skin:- No  edema,   good pulses Psych-affect is appropriate, oriented x3 Neuro-no new focal deficits, no tremors    Data Review   CBC w Diff:  Lab Results  Component Value Date   WBC 12.6 (H) 03/09/2022   HGB 13.7 03/09/2022   HGB 16.1 02/10/2022   HCT 41.9 03/09/2022   HCT 46.8 02/10/2022   PLT 198 03/09/2022   PLT 243 02/10/2022   LYMPHOPCT 7 03/09/2022   MONOPCT 7 03/09/2022   EOSPCT 0 03/09/2022   BASOPCT 0 03/09/2022    CMP:  Lab Results  Component Value Date   NA 133 (L) 03/09/2022   NA 140 02/10/2022   K 4.3 03/09/2022   CL 98 03/09/2022   CO2 24 03/09/2022   BUN 38 (H) 03/09/2022   BUN 13 02/10/2022   CREATININE 1.49 (H) 03/09/2022   PROT 6.9 03/07/2022   PROT 8.0 08/16/2021   ALBUMIN 3.7 03/07/2022   ALBUMIN 4.7 08/16/2021   BILITOT 0.7 03/07/2022   BILITOT 0.7 08/16/2021   ALKPHOS 64 03/07/2022   AST 27 03/07/2022   ALT 25 03/07/2022  .  Total Discharge time is about 33 minutes  Shon Hale M.D on 03/09/2022 at 12:28 PM  Go to [email or web address] -  for contact info  [facility] - Office  [phone]

## 2022-03-09 NOTE — Care Management Important Message (Signed)
Important Message  Patient Details  Name: Kevin Smith MRN: [id number] Date of Birth: 26-Jan-1956   Medicare Important Message Given:  N/A - LOS <3 / Initial given by admissions     Corey Harold 03/09/2022, 12:45 PM

## 2022-03-09 NOTE — Progress Notes (Signed)
Pt pleasant and cooperative this shift. Pt did have a few episodes where he took his oxygen off and felt short of breath. Pt encouraged to keep oxygen on. Pt c/o insomnia, PRN medication given which was effective. Plan of care ongoing.

## 2022-03-09 NOTE — Progress Notes (Signed)
Walked on room air and sats stayed in mid 90's.  IV removed and discharge instructions reviewed.  Scripts sent to pharmacy and [employer] given from pharmacy.  Safe transport called and patient in lounge waiting

## 2022-03-09 NOTE — TOC Transition Note (Signed)
Transition of Care [facility]) - CM/SW Discharge Note   Patient Details  Name: Kevin Smith MRN: [id number] Date of Birth: 1955/09/17  Transition of [facility] CM/SW Contact:  Villa Herb, LCSWA Phone Number: 03/09/2022, 1:15 PM   Clinical Narrative:    Pts walker has been ordered through [employer]. MD placed HH PT/OT/SW orders. CSW updated Clifton Custard with [facility] of plan for D/C. CSW spoke to [facility] who confirm they have pulled pts medications and will have them delivered to pt. They also spoke with pt as well. TOC signing off.   Final next level of care: Home w Home Health Services Barriers to Discharge: Barriers Resolved   Patient Goals and CMS Choice CMS Medicare.gov Compare Post Acute Care list provided to:: Patient Choice offered to / list presented to : Patient  Discharge Placement                         Discharge Plan and Services Additional resources added to the After Visit Summary for   In-house Referral: Clinical Social Work   Post Acute Care Choice: Home Health          DME Arranged: Dan Humphreys DME Agency: [facility]       HH Arranged: OT, PT, Social Work [employer] Agency: [facility] Date [facility] Agency Contacted: 03/09/22   Representative spoke with at [facility] Agency: Clifton Custard  Social Determinants of Health (SDOH) Interventions SDOH Screenings   Food Insecurity: Food Insecurity Present (03/07/2022)  Housing: Low Risk  (03/07/2022)  Transportation Needs: Unmet Transportation Needs (03/07/2022)  Utilities: Not At Risk (03/07/2022)  Alcohol Screen: Medium Risk (11/25/2020)  Depression (PHQ2-9): High Risk (01/23/2022)  Financial Resource Strain: Medium Risk (11/25/2020)  Physical Activity: Insufficiently Active (11/25/2020)  Social Connections: Socially Isolated (11/25/2020)  Stress: No Stress Concern Present (11/25/2020)  Tobacco Use: High Risk (03/07/2022)     Readmission Risk Interventions    03/07/2022   12:41 PM  Readmission Risk Prevention Plan   Transportation Screening Complete  Home Care Screening Complete  Medication Review (RN CM) Complete

## 2022-03-10 ENCOUNTER — Telehealth: Payer: Self-pay | Admitting: *Deleted

## 2022-03-10 ENCOUNTER — Ambulatory Visit: Payer: Self-pay | Admitting: *Deleted

## 2022-03-10 ENCOUNTER — Telehealth: Payer: Self-pay

## 2022-03-10 DIAGNOSIS — I25709 Atherosclerosis of coronary artery bypass graft(s), unspecified, with unspecified angina pectoris: Secondary | ICD-10-CM

## 2022-03-10 DIAGNOSIS — I4891 Unspecified atrial fibrillation: Secondary | ICD-10-CM

## 2022-03-10 DIAGNOSIS — I10 Essential (primary) hypertension: Secondary | ICD-10-CM

## 2022-03-10 DIAGNOSIS — I5022 Chronic systolic (congestive) heart failure: Secondary | ICD-10-CM

## 2022-03-10 NOTE — Telephone Encounter (Addendum)
03/10/2022  [name] DOB: [birth year] MRN: [id number]   RIDER WAIVER AND RELEASE OF LIABILITY  For the purposes of helping with transportation needs, [place] partners with outside transportation providers (taxi companies, [place], Catering manager.) to give [employer] patients or other approved people the choice of on-demand rides [employer]") to our buildings for non-emergency visits.  By using [employer], I, the person signing this document, on behalf of myself and/or any legal minors (in my care using the [employer]), agree:  Science writer given to me are supplied by independent, outside transportation providers who do not work for, or have any affiliation with, [employer]. [place] is not a transportation company. [place] has no control over the quality or safety of the rides I get using [employer]. [place] has no control over whether any outside ride will happen on time or not. [place] gives no guarantee on the reliability, quality, safety, or availability on any rides, or that no mistakes will happen. I know and accept that traveling by vehicle (car, truck, SVU, Zenaida Niece, bus, taxi, etc.) has risks of serious injuries such as disability, being paralyzed, and death. I know and agree the risk of using [employer] is mine alone, and not [employer]. Transport Services are provided "as is" and as are available. The transportation providers are in charge for all inspections and care of the vehicles used to provide these rides. I agree not to take legal action against [place], its agents, employees, officers, directors, representatives, insurers, attorneys, assigns, successors, subsidiaries, and affiliates at any time for any reasons related directly or indirectly to using [employer]. I also agree not to take legal action against [place] or its affiliates for any injury, death, or damage to property caused by or related to using  [employer]. I have read this Waiver and Release of Liability, and I understand the terms used in it and their legal meaning. This Waiver is freely and voluntarily given with the understanding that my right (or any legal minors) to legal action against [place] relating to [employer] is knowingly given up to use these services.   I attest that I read the Ride Waiver and Release of Liability to [name], gave Mr. Nyland the opportunity to ask questions and answered the questions asked (if any). I affirm that Nic Lampe then provided consent for assistance with transportation.     Issa Luster D Wm Sahagun                                Telephone encounter was:  Successful.  03/10/2022 Name: Dalen Hennessee MRN: [id number] DOB: [birth year]  Montarius Kitagawa is a 66 y.o. year old male who is a primary care patient of Daryll Drown, NP . The community resource team was consulted for assistance with Transportation Needs  and Food Insecurity  Care guide performed the following interventions: Spoke with patient to schedule transportation through [employer].  Read rider waiver. Informed patient he could call the customer service number on the back of his [employer] card to start his [employer] Well Dine meals.  Follow Up Plan:  Let patient know I would call him when I receive a response from [employer].  Deakon Frix [facility]  [facility] Population Health Community Resource Care Guide   [email or web address]  ?? [phone]   Website: [email or web address]  [email or web address]

## 2022-03-10 NOTE — Patient Outreach (Signed)
  Care Coordination   Care coordination  Visit Note   03/10/2022 Name: Kevin Smith MRN: [id number] DOB: [birth year]  Kevin Smith is a 66 y.o. year old male who sees Daryll Drown, NP for primary care. I  ordered an emergent transportation referral to [facility] care guides  What matters to the patients health and wellness today?  Need EMERGENT transportation to his pcp on 03/15/22 at 1 on     Goals Addressed               This Visit's Progress     Patient Stated     Emergent Transportation to pcp for hospital follow up [facility]) (pt-stated)   Not on track     Care Coordination Interventions: Evaluation of current treatment plan related to transportation  and patient's adherence to plan as established by provider Care Guide referral for emergent transportation to pcp on 03/15/22 at 1 pm  Collaborated with [facility] CMA & TOC RN CM         SDOH assessments and interventions completed:  Yes  SDOH Interventions Today    Flowsheet Row Most Recent Value  SDOH Interventions   Transportation Interventions Kevin (Comment)  [Emergent referral to [facility] Care guides for 03/15/22 pcp office post hospitalization]        Care Coordination Interventions:  Yes, provided   Follow up plan: Follow up call scheduled for 03/22/22    Encounter Outcome:  Pt. Visit Completed   Leandro Berkowitz L. Noelle Penner, RN, BSN, CCM [facility] Care Coordinator Office number [phone]

## 2022-03-10 NOTE — Progress Notes (Signed)
  Care Coordination  Note  03/10/2022 Name: Kevin Smith MRN: [id number] DOB: Jul 23, 1955  [name] is a 66 y.o. year old primary care patient of Daryll Drown, NP. I reached out to [facility] by phone today to assist with scheduling a follow up appointment. [name] verbally consented to my assistance.       Follow up plan: Hospital Follow Up appointment scheduled with (DOD) on (03/15/22) at (1:00). [facility]  Care Coordination Care Guide  Direct Dial: [phone]

## 2022-03-10 NOTE — Patient Outreach (Signed)
  Care Coordination [facility] Note Transition Care Management Follow-up Telephone Call Date of discharge and from where: 03/09/22 [facility] new onset of atrial fibrillation How have you been since you were released from the hospital? My breathing is better but I am still weak Any questions or concerns? Yes I would like some meals delivered to me while I am recovering   Items Reviewed: Did the pt receive and understand the discharge instructions provided? Yes  Medications obtained and verified? Yes  Other? Yes  reviewed atrial fib Any new allergies since your discharge? No  Dietary orders reviewed? Yes Do you have support at home?  Daughter helps sometimes  Home Care and Equipment/Supplies: Were home health services ordered? yes If so, what is the name of the agency? [facility]  Has the agency set up a time to come to the patient's home? yes Were any new equipment or medical supplies ordered?  No What is the name of the medical supply agency? N/A Were you able to get the supplies/equipment? not applicable Do you have any questions related to the use of the equipment or supplies? No  Functional Questionnaire: (I = Independent and D = Dependent) ADLs: I  Bathing/Dressing- I  Meal Prep- I  Eating- I  Maintaining continence- I  Transferring/Ambulation- I  Managing Meds- I  Follow up appointments reviewed:  PCP Hospital f/u appt confirmed? No  sent to care guide to schedule Specialist Hospital f/u appt confirmed? No   Are transportation arrangements needed? Yes  referral for SDOH If their condition worsens, is the pt aware to call PCP or go to the Emergency Dept.? Yes Was the patient provided with contact information for the PCP's office or ED? Yes Was to pt encouraged to call back with questions or concerns? Yes  SDOH assessments and interventions completed:   Yes   Care Coordination Interventions:  PCP follow up appointment requested Referred for Care  Coordination Services:  RN Care Coordinator Referral to care guide for transportation and meal delivery    Encounter Outcome:  Pt. Visit Completed   Dudley Major RN, [facility], CDE Care Management Coordinator [facility] Care Management [phone]

## 2022-03-10 NOTE — Patient Instructions (Signed)
Visit Information  Thank you for taking time to visit with me today. Please don't hesitate to contact me if I can be of assistance to you.   Following are the goals we discussed today:   Goals Addressed               This Visit's Progress     Patient Stated     Emergent Transportation to pcp for hospital follow up [facility]) (pt-stated)   Not on track     Care Coordination Interventions: Evaluation of current treatment plan related to transportation  and patient's adherence to plan as established by provider Care Guide referral for emergent transportation to pcp on 03/15/22 at 1 pm  Collaborated with [facility] CMA & TOC RN CM         Our next appointment is by telephone on 03/22/22 at 0930  Please call the care guide team at [phone] if you need to cancel or reschedule your appointment.   If you are experiencing a Mental Health or Behavioral Health Crisis or need someone to talk to, please call the Suicide and Crisis Lifeline: 988 call the [place] National Suicide Prevention Lifeline: 940-329-6726 or TTY: (501)202-7881 TTY (252)064-7987) to talk to a trained counselor call 1-800-273-TALK (toll free, 24 hour hotline) call the [facility]: (463) [phone] call 911   The patient verbalized understanding of instructions, educational materials, and care plan provided today and DECLINED offer to receive copy of patient instructions, educational materials, and care plan.   The patient has been provided with contact information for the care management team and has been advised to call with any health related questions or concerns.    Kaegan Hettich L. Noelle Penner, RN, BSN, CCM [facility] Care Coordinator Office number [phone]

## 2022-03-14 ENCOUNTER — Telehealth: Payer: Self-pay | Admitting: *Deleted

## 2022-03-14 DIAGNOSIS — I25709 Atherosclerosis of coronary artery bypass graft(s), unspecified, with unspecified angina pectoris: Secondary | ICD-10-CM

## 2022-03-14 DIAGNOSIS — I4891 Unspecified atrial fibrillation: Secondary | ICD-10-CM | POA: Diagnosis not present

## 2022-03-14 DIAGNOSIS — E785 Hyperlipidemia, unspecified: Secondary | ICD-10-CM

## 2022-03-14 DIAGNOSIS — I2510 Atherosclerotic heart disease of native coronary artery without angina pectoris: Secondary | ICD-10-CM | POA: Diagnosis not present

## 2022-03-14 DIAGNOSIS — J101 Influenza due to other identified influenza virus with other respiratory manifestations: Secondary | ICD-10-CM | POA: Diagnosis not present

## 2022-03-14 DIAGNOSIS — I5022 Chronic systolic (congestive) heart failure: Secondary | ICD-10-CM | POA: Diagnosis not present

## 2022-03-14 DIAGNOSIS — M1990 Unspecified osteoarthritis, unspecified site: Secondary | ICD-10-CM | POA: Diagnosis not present

## 2022-03-14 DIAGNOSIS — J441 Chronic obstructive pulmonary disease with (acute) exacerbation: Secondary | ICD-10-CM | POA: Diagnosis not present

## 2022-03-14 DIAGNOSIS — I110 Hypertensive heart disease with heart failure: Secondary | ICD-10-CM | POA: Diagnosis not present

## 2022-03-14 DIAGNOSIS — M791 Myalgia, unspecified site: Secondary | ICD-10-CM | POA: Diagnosis not present

## 2022-03-14 DIAGNOSIS — I252 Old myocardial infarction: Secondary | ICD-10-CM

## 2022-03-14 DIAGNOSIS — I719 Aortic aneurysm of unspecified site, without rupture: Secondary | ICD-10-CM | POA: Diagnosis not present

## 2022-03-14 DIAGNOSIS — Z951 Presence of aortocoronary bypass graft: Secondary | ICD-10-CM

## 2022-03-14 MED ORDER — EZETIMIBE 10 MG PO TABS: 10.0000 mg | ORAL_TABLET | Freq: Every day | ORAL | 2 refills | Status: DC

## 2022-03-14 MED ORDER — NITROGLYCERIN 0.4 MG SL SUBL: 0.4000 mg | SUBLINGUAL_TABLET | SUBLINGUAL | 2 refills | Status: DC | PRN

## 2022-03-14 NOTE — Telephone Encounter (Signed)
Pt has transferred to [facility] from [place], had no RFs on [employer] Sent RFs on these

## 2022-03-15 ENCOUNTER — Telehealth: Payer: Self-pay

## 2022-03-15 ENCOUNTER — Ambulatory Visit (INDEPENDENT_AMBULATORY_CARE_PROVIDER_SITE_OTHER): Payer: Medicare HMO | Admitting: Nurse Practitioner

## 2022-03-15 ENCOUNTER — Encounter: Payer: Self-pay | Admitting: Nurse Practitioner

## 2022-03-15 VITALS — BP 91/59 | HR 75 | Temp 98.7°F | Ht 71.0 in | Wt 226.0 lb

## 2022-03-15 DIAGNOSIS — B0239 Other herpes zoster eye disease: Secondary | ICD-10-CM

## 2022-03-15 DIAGNOSIS — I959 Hypotension, unspecified: Secondary | ICD-10-CM | POA: Diagnosis not present

## 2022-03-15 DIAGNOSIS — H10013 Acute follicular conjunctivitis, bilateral: Secondary | ICD-10-CM | POA: Diagnosis not present

## 2022-03-15 DIAGNOSIS — R059 Cough, unspecified: Secondary | ICD-10-CM | POA: Diagnosis not present

## 2022-03-15 MED ORDER — ACYCLOVIR 800 MG PO TABS: 800.0000 mg | ORAL_TABLET | Freq: Four times a day (QID) | ORAL | 0 refills | Status: DC

## 2022-03-15 NOTE — Telephone Encounter (Signed)
   Telephone encounter was:  Successful.  03/15/2022 Name: Marselino Slayton MRN: [id number] DOB: 06/04/1955  Jakorey Mcconathy is a 67 y.o. year old male who is a primary care patient of [name], NP . The community resource team was consulted for assistance with Transportation Needs   Care guide performed the following interventions: Spoke with patient to confirm pickup time today of 12:40pm.    Follow Up Plan:  No further follow up planned at this time. The patient has been provided with needed resources.  [place] Resource Care Guide   [email or web address]  ?? [phone]   Website: [email or web address]  [email or web address]

## 2022-03-15 NOTE — Patient Instructions (Signed)

## 2022-03-15 NOTE — Progress Notes (Signed)
Acute Office Visit  Subjective:     Patient ID: Kevin Smith, male    DOB: 1956/03/10, 67 y.o.   MRN: [id number]  Chief Complaint  Patient presents with   Cough   Herpes Zoster    Rash This is a new problem. The current episode started yesterday. The problem is unchanged. The affected locations include the face and left eye. The rash is characterized by blistering, itchiness and redness. He was exposed to nothing. Associated symptoms include coughing. Pertinent negatives include no congestion or fever. Past treatments include nothing. The treatment provided no relief.   Patient is in today for hypertension.  Symptoms started about 24 hours prior.  Patient reports fatigue and tiredness.  Patient is not drinking as much water and may be dehydrated.  No fever, headaches or visual changes associated with current concerns.  Patient is also following up after ED visit for A-fib.  He is not having any worsening symptoms today.  Patient reports understanding of all discharge instructions from emergency department and taking all medication as prescribed.  Review of Systems  Constitutional:  Negative for fever.  HENT:  Positive for ear discharge and ear pain. Negative for congestion.   Eyes: Negative.   Respiratory:  Positive for cough.   Skin:  Positive for itching and rash.  All other systems reviewed and are negative.       Objective:    BP (!) 91/59   Pulse 75   Temp 98.7 F (37.1 C)   Ht 5\' 11"  (1.803 m)   Wt 226 lb (102.5 kg)   SpO2 95%   BMI 31.52 kg/m  BP Readings from Last 3 Encounters:  03/15/22 (!) 91/59  03/09/22 130/84  02/01/22 134/78   Wt Readings from Last 3 Encounters:  03/15/22 226 lb (102.5 kg)  03/08/22 239 lb 10.2 oz (108.7 kg)  02/01/22 232 lb 12.8 oz (105.6 kg)      Physical Exam Vitals and nursing note reviewed.  Constitutional:      Appearance: Normal appearance.  HENT:     Head: Normocephalic.     Right Ear: External ear normal.     Left  Ear: External ear normal.  Eyes:     General:        Left eye: Discharge present.     Comments: Shingles rash/pain/erythema left eye lid and around left eye area  Cardiovascular:     Pulses: Normal pulses.     Heart sounds: Normal heart sounds.  Neurological:     Mental Status: He is alert.      No results found for any visits on 03/15/22.      Assessment & Plan:  Patient presents with symptoms of rash on left eyelid with eye redness.  Rash presents as shingles after assessment started patient desciclovir, advised patient to walk into eye clinic for further assessment.  Patient verbalized understanding.  Concerning patient's hypotension, patient reports that he has not been drinking, patient was given water in clinic to increase hydration.  Take blood pressure log daily, follow-up with ongoing hypotension.  Patient was recently seen in the emergency department for A-fib.  Completed ED follow-up education.  Patient verbalized understanding and is to follow-up as directed. Problem List Items Addressed This Visit   None Visit Diagnoses     Shingles of eyelid    -  Primary   Relevant Medications   acyclovir (ZOVIRAX) 800 MG tablet   Cough, unspecified type       Hypotension,  unspecified hypotension type           Meds ordered this encounter  Medications   acyclovir (ZOVIRAX) 800 MG tablet    Sig: Take 1 tablet (800 mg total) by mouth 4 (four) times daily.    Dispense:  20 tablet    Refill:  0    Order Specific Question:   Supervising Provider    Answer:   Claretta Fraise [[id number]]    Return if symptoms worsen or fail to improve.  [name], NP

## 2022-03-20 ENCOUNTER — Telehealth: Payer: Self-pay | Admitting: Nurse Practitioner

## 2022-03-20 DIAGNOSIS — I5022 Chronic systolic (congestive) heart failure: Secondary | ICD-10-CM | POA: Diagnosis not present

## 2022-03-20 DIAGNOSIS — I719 Aortic aneurysm of unspecified site, without rupture: Secondary | ICD-10-CM | POA: Diagnosis not present

## 2022-03-20 DIAGNOSIS — I110 Hypertensive heart disease with heart failure: Secondary | ICD-10-CM | POA: Diagnosis not present

## 2022-03-20 DIAGNOSIS — I4891 Unspecified atrial fibrillation: Secondary | ICD-10-CM | POA: Diagnosis not present

## 2022-03-20 DIAGNOSIS — M791 Myalgia, unspecified site: Secondary | ICD-10-CM | POA: Diagnosis not present

## 2022-03-20 DIAGNOSIS — I2510 Atherosclerotic heart disease of native coronary artery without angina pectoris: Secondary | ICD-10-CM | POA: Diagnosis not present

## 2022-03-20 DIAGNOSIS — J441 Chronic obstructive pulmonary disease with (acute) exacerbation: Secondary | ICD-10-CM | POA: Diagnosis not present

## 2022-03-20 DIAGNOSIS — M1990 Unspecified osteoarthritis, unspecified site: Secondary | ICD-10-CM | POA: Diagnosis not present

## 2022-03-20 DIAGNOSIS — J101 Influenza due to other identified influenza virus with other respiratory manifestations: Secondary | ICD-10-CM | POA: Diagnosis not present

## 2022-03-20 NOTE — Telephone Encounter (Signed)
What symptoms is he having? Nausea? We can treat that. He can take medication with food!  It is important to complete antiviral for shingles. If he can absolutely not take medication he can discontinue.

## 2022-03-20 NOTE — Telephone Encounter (Signed)
TC from nurse w/ [facility] She spoke w/ pt this morning, he said he was unable to go over his medications at his last visit. He is very weak and unable to get out of the bed, he is not able to take his BP d/t to battery issues in his machine. Nurse had concerns with him taking Losartan and Metoprolol. I informed her that in looking over his chart he was to have DCd the Losartan back on 01/02/22 at hospital discharge. Please advise on this.

## 2022-03-22 ENCOUNTER — Telehealth: Payer: Self-pay

## 2022-03-22 ENCOUNTER — Ambulatory Visit: Payer: Medicare HMO | Admitting: Nurse Practitioner

## 2022-03-22 ENCOUNTER — Telehealth: Payer: Self-pay | Admitting: Cardiovascular Disease

## 2022-03-22 ENCOUNTER — Other Ambulatory Visit: Payer: Self-pay | Admitting: Nurse Practitioner

## 2022-03-22 ENCOUNTER — Encounter: Payer: Self-pay | Admitting: *Deleted

## 2022-03-22 ENCOUNTER — Telehealth: Payer: Self-pay | Admitting: *Deleted

## 2022-03-22 DIAGNOSIS — B0239 Other herpes zoster eye disease: Secondary | ICD-10-CM

## 2022-03-22 MED ORDER — GABAPENTIN 100 MG PO CAPS: 100.0000 mg | ORAL_CAPSULE | Freq: Three times a day (TID) | ORAL | 0 refills | Status: DC

## 2022-03-22 NOTE — Telephone Encounter (Signed)
Attempted to call pt left vm for cb 

## 2022-03-22 NOTE — Telephone Encounter (Signed)
I just wanted to make sure attached patient response got to care Nurse, looked like I had sent it back to sender. Thanks

## 2022-03-22 NOTE — Telephone Encounter (Signed)
Bernerd Pho, PA-C is out of the office. Will route to DOD for review as pt does not have a gen cardiologist.   Please advise.

## 2022-03-22 NOTE — Telephone Encounter (Signed)
We have seen many patients with influenza A recently develop secondary PNA and sepsis or kidney failure. Difficult to say what is going on without labwork. His kidney function was uptrending by last labs in December. His blood pressure was similarly low at visit 03/15/22 with primary care, HR normal at that visit. If symptomatic with low BPs recommend return to ED for expedited evaluation If he does not wish to go, can hold Jardiance, spironolactone, Lasix, valsartan and see PCP ASAP Please move up cardiology f/u as well

## 2022-03-22 NOTE — Telephone Encounter (Signed)
Spoke to Bradd Canary, NP with [facility] who verbalized understanding. Arbie Cookey stated she will call pt with recommendations.   Will fwd to schedulers to get sooner cardiology f/u scheduled.

## 2022-03-22 NOTE — Patient Outreach (Signed)
  Care Coordination   Follow Up Visit Note   03/22/2022 Name: Valerio Pinard MRN: [id number] DOB: 05-25-1955  Hershel Corkery is a 67 y.o. year old male who sees [name], NP for primary care. I spoke with  [name] by phone today.  What matters to the patients health and wellness today?  MY BP IS LOW AND I HAVE SHINGLES EYE PAIN.    Goals Addressed               This Visit's Progress     Patient Stated     COMPLETED: Emergent Transportation to pcp for hospital follow up [facility]) (pt-stated)        Care Coordination Interventions: Evaluation of current treatment plan related to transportation  and patient's adherence to plan as established by provider Care Guide referral for emergent transportation to pcp on 03/15/22 at 1 pm  Collaborated with [facility] CMA & TOC RN CM   TRANSPORTATION WAS PROVIDED AND PT WAS ABLE TO ATTEND HIS 03/15/22 APPT. HIS USUAL TRANSPORTATION WILL BE AVAILABLE IN THE FUTURE.      Other     GET PAIN RELIEF OF SHINGLES OVER THE NEXT 2 WEEKS.        Care Coordination Interventions: Pt dx with shingles on 03/15/22 and was started on acyclovir and told to take APAP for pain. Pain level today is 8/10.  Called PCP to request gabapentin for pain relief. PCP ordered gabapentin 100 mg tid for pain.         IMPROVE MY SELF MANAGEMENT OF MY CARDIOVASCULAR PROBLEMS: HTN CAD, AFIB, HYPERLIPIDEMA        Care Coordination Interventions: Evaluation of current treatment plan related to hypertension self management and patient's adherence to plan as established by provider Reviewed medications with patient and discussed importance of compliance Advised patient, providing education and rationale, to monitor blood pressure daily and record, calling PCP for findings outside established parameters Reviewed scheduled/upcoming provider appointments including:   PT REPORTS LOW BP READINGS (100/60 RANGE) WITH DIZZINESS, FEELING VERY FATIGUED: ON AMIODARONE 200 MG DAILY, TOPROL XL  50 MG DAILY, VALSARTAN 80 MG DAILY. HE ALSO TAKES FUROSEMIDE 40 MGS.  CALLED DR. York Cerise OFFICE AND LEFT MESSAGE REGARDING SYMPTOMATIC HYPOTENSION, REQUESTED CALL BACK WITH ORDERS FOR MEDICATION ADJUSTMENT.  [place] OFFICE RETURNED CALL AND ADVISED PT IS TO HOLD: JARDIANCE, LASIX, VALSARTAN AND SPIRONOLACTONE FOR NOW. PT WILL SEE PCP ON MONDAY.           SDOH assessments and interventions completed:  Yes   PREVIOUSLY ADDRESSED.  Care Coordination Interventions:  Yes, provided   Follow up plan: Follow up call scheduled for 2 WEEKS.    Encounter Outcome:  Pt. Visit Completed   Kayleen Memos C. Myrtie Neither, MSN, [facility] Gerontological Nurse Practitioner [facility] Care Management [phone]

## 2022-03-22 NOTE — Telephone Encounter (Signed)
Call received in [facility] Triage from provider, Deloria Lair NP of [facility].   Per Ms. Spinks NP, pt has been hypotensive, with BP of 100/60, symptomatic, and is on Toprol XL, Amiodarone, Valsartan, and lasix that may be contributing to this concern.    Pt called, confirmed Pt has been symptomatic 1 week, s/p hospital visit; With intermittent dizziness, and has felt uneasy on his feet.  Pt states while at MD office, Bp assessed at 91/63.    I will reach out to provider, Ms Delano Metz, and [place] pool for follow up care plan.  Pt has upcoming appointment on 03/27/22 at 1215 pm as follow up.

## 2022-03-22 NOTE — Telephone Encounter (Signed)
New Message:     Kayleen Memos from [facility] is calling to talk to the nurse about patient's blood pressure.   Pt c/o BP issue: STAT if pt c/o blurred vision, one-sided weakness or slurred speech  1. What are your last 5 BP readings? 100/60  2. Are you having any other symptoms (ex. Dizziness, headache, blurred vision, passed out)?   3. What is your BP issue?

## 2022-03-22 NOTE — Telephone Encounter (Signed)
Received a phone call from Deloria Lair who is a NP with [facility].  She had an encounter with patient this morning.  The patient voiced to her that he was being treated for shingles by you and that you had prescribed the anti-viral and also recommended Tylenol.  Patient told her that his pain level is an 8 of 10 and that he feels he needs something stronger.  Deloria Lair asked that I pass this information along to you and asked if you could prescribe Gabapentin for this patient and send it to [facility].

## 2022-03-22 NOTE — Telephone Encounter (Signed)
I sent RX to pharmacy, please let us know if symptoms are mot well controlled

## 2022-03-23 ENCOUNTER — Telehealth: Payer: Self-pay

## 2022-03-23 NOTE — Telephone Encounter (Signed)
   Telephone encounter was:  Successful.  03/23/2022 Name: Kevin Smith MRN: [id number] DOB: Oct 18, 1955  Kevin Smith is a 67 y.o. year old male who is a primary care patient of [name], NP . The community resource team was consulted for assistance with Transportation Needs   Care guide performed the following interventions: Received Teams message from [place] requesting transportation assistance . Spoke to patient schedule transportation for 03/31/22 3pm at [facility] with Bernerd Pho PA-C. Sending request to [employer].   Follow Up Plan:   I will contact patient once I receive confirmation from [place].  03/23/2022  Kevin Smith DOB: 1955-11-12 MRN: [id number]   RIDER WAIVER AND RELEASE OF LIABILITY  For the purposes of helping with transportation needs, [facility] partners with outside transportation providers (taxi companies, [place], Social research officer, government.) to give [employer] patients or other approved people the choice of on-demand rides [employer]") to our buildings for non-emergency visits.  By using [employer], I, the person signing this document, on behalf of myself and/or any legal minors (in my care using the [employer]), agree:  Government social research officer given to me are supplied by independent, outside transportation providers who do not work for, or have any affiliation with, [employer]. [place] is not a transportation company. [place] has no control over the quality or safety of the rides I get using [employer]. [place] has no control over whether any outside ride will happen on time or not. [facility] gives no guarantee on the reliability, quality, safety, or availability on any rides, or that no mistakes will happen. I know and accept that traveling by vehicle (car, truck, SVU, [place]ianne Lei, bus, taxi, etc.) has risks of serious injuries such as disability, being paralyzed, and death. I know and agree the risk of using  [employer] is mine alone, and not [employer]. Transport Services are provided "as is" and as are available. The transportation providers are in charge for all inspections and care of the vehicles used to provide these rides. I agree not to take legal action against [place], its agents, employees, officers, directors, representatives, insurers, attorneys, assigns, successors, subsidiaries, and affiliates at any time for any reasons related directly or indirectly to using [employer]. I also agree not to take legal action against [place] or its affiliates for any injury, death, or damage to property caused by or related to using [employer]. I have read this Waiver and Release of Liability, and I understand the terms used in it and their legal meaning. This Waiver is freely and voluntarily given with the understanding that my right (or any legal minors) to legal action against [place] relating to [employer] is knowingly given up to use these services.   I attest that I read the Ride Waiver and Release of Liability to Kevin Smith, gave Mr. Zwilling the opportunity to ask questions and answered the questions asked (if any). I affirm that Makenzie Vittorio then provided consent for assistance with transportation.     [place] Community Resource Care Guide   [email or web address]  ?? [phone]   Website: [email or web address]  [email or web address]

## 2022-03-23 NOTE — Telephone Encounter (Signed)
   Telephone encounter was:  Successful.  03/23/2022 Name: Kevin Smith MRN: [id number] DOB: 24-Jul-1955  Kevin Smith is a 67 y.o. year old male who is a primary care patient of [name], NP . The community resource team was consulted for assistance with Transportation Needs   Care guide performed the following interventions: Spoke with patient to confirm pickup time of 2:15pm with [facility] for 03/31/22 appointment.   Follow Up Plan:  No further follow up planned at this time. The patient has been provided with needed resources.  [place] Resource Care Guide   [email or web address]  ?? [phone]   Website: [email or web address]  [email or web address]

## 2022-03-27 ENCOUNTER — Ambulatory Visit: Payer: Medicare HMO | Admitting: Nurse Practitioner

## 2022-03-28 ENCOUNTER — Encounter: Payer: Self-pay | Admitting: Nurse Practitioner

## 2022-03-29 ENCOUNTER — Ambulatory Visit: Payer: Self-pay

## 2022-03-30 ENCOUNTER — Telehealth: Payer: Self-pay

## 2022-03-30 NOTE — Patient Instructions (Signed)
Visit Information  Thank you for taking time to visit with me today. Please don't hesitate to contact me if I can be of assistance to you.   Following are the goals we discussed today:   Goals Addressed             This Visit's Progress    Care Coordination Activites -follow up on low blood pressure       Care Coordination Interventions: Active listening / Reflection utilized  Emotional Support Provided Problem Kevin strategies reviewed Evaluation of current treatment plan related to left eye issues and patient's adherence to plan as established by provider Reviewed medications with patient and discussed  Advised patient to discuss left eye problems with provider Kevin Smith reported that he is doing well with regard to his Afib and has not experienced any issues. He was taken off of four blood pressure medications and he feels better. His blood pressure today was 121/82. However, he is experiencing issues with his left eye, which had shingles on the eyelid. Although the blisters have disappeared after taking medication, he still feels pain and itchiness in his eye. I advised him to call the office to day and ask for advise.          Our next appointment is by telephone on 05/01/22 at 2 pm  Please call the care guide team at [phone] if you need to cancel or reschedule your appointment.   If you are experiencing a Mental Health or [place] or need someone to talk to, please call 1-800-273-TALK (toll free, 24 hour hotline)  The patient verbalized understanding of instructions, educational materials, and care plan provided today and DECLINED offer to receive copy of patient instructions, educational materials, and care plan.   Lazaro Arms RN, BSN, [facility] Care Coordinator CM Registered Nurse Coverage  for  Kevin Quails RN   Phone: [phone]

## 2022-03-30 NOTE — Telephone Encounter (Signed)
   Telephone encounter was:  Successful.  03/30/2022 Name: [name] MRN: [id number] DOB: 05-04-1955  Kevin Smith is a 67 y.o. year old male who is a primary care patient of [name], FNP . The community resource team was consulted for assistance with Transportation Needs   Care guide performed the following interventions: Spoke with patient to update him that [place] will be transporting him tomorrow rather than [place].  Reminded him his pickup time will still be 2:15pm.   Follow Up Plan:  No further follow up planned at this time. The patient has been provided with needed resources.  [place] Resource Care Guide   [email or web address]  ?? [phone]   Website: [email or web address]  [email or web address]

## 2022-03-30 NOTE — Patient Outreach (Signed)
  Care Coordination   Follow Up Visit Note   03/30/2022 Late Entry Name: Kevin Smith MRN: [id number] DOB: [birth year]  Kevin Smith is a 67 y.o. year old male who sees Kevin Perking, FNP for primary care. I spoke with  Kevin Smith by phone today.  What matters to the patients health and wellness today?  Blood pressure is doing better.    Goals Addressed             This Visit's Progress    Care Coordination Activites -follow up on low blood pressure       Care Coordination Interventions: Active listening / Reflection utilized  Emotional Support Provided Problem Kevin Smith strategies reviewed Evaluation of current treatment plan related to left eye issues and patient's adherence to plan as established by provider Reviewed medications with patient and discussed  Advised patient to discuss left eye problems with provider Kevin Smith reported that he is doing well with regard to his Afib and has not experienced any issues. He was taken off of four blood pressure medications and he feels better. His blood pressure today was 121/82. However, he is experiencing issues with his left eye, which had shingles on the eyelid. Although the blisters have disappeared after taking medication, he still feels pain and itchiness in his eye. I advised him to call the office to day and ask for advise.          SDOH assessments and interventions completed:  No     Care Coordination Interventions:  Yes, provided   Follow up plan:  Follow up schedulleed with Kevin Smith 05/01/22 2 pm    Encounter Outcome:  Pt. Visit Completed   Kevin Arms Smith, BSN, [place] Coordinator CM Registered Nurse Coverage  for  Kevin Smith   Phone: [phone]

## 2022-03-31 ENCOUNTER — Encounter: Payer: Self-pay | Admitting: Medical

## 2022-03-31 ENCOUNTER — Ambulatory Visit: Payer: Medicare HMO | Attending: Medical | Admitting: Medical

## 2022-03-31 VITALS — BP 130/86 | HR 98 | Ht 71.0 in | Wt 235.0 lb

## 2022-03-31 DIAGNOSIS — Z7189 Other specified counseling: Secondary | ICD-10-CM | POA: Diagnosis not present

## 2022-03-31 DIAGNOSIS — I5020 Unspecified systolic (congestive) heart failure: Secondary | ICD-10-CM | POA: Diagnosis not present

## 2022-03-31 DIAGNOSIS — I480 Paroxysmal atrial fibrillation: Secondary | ICD-10-CM

## 2022-03-31 DIAGNOSIS — G4730 Sleep apnea, unspecified: Secondary | ICD-10-CM

## 2022-03-31 DIAGNOSIS — I5022 Chronic systolic (congestive) heart failure: Secondary | ICD-10-CM | POA: Diagnosis not present

## 2022-03-31 MED ORDER — SPIRONOLACTONE 25 MG PO TABS: 12.5000 mg | ORAL_TABLET | Freq: Every day | ORAL | 3 refills | Status: DC

## 2022-03-31 MED ORDER — EMPAGLIFLOZIN 10 MG PO TABS: 10.0000 mg | ORAL_TABLET | Freq: Every day | ORAL | 6 refills | Status: DC

## 2022-03-31 NOTE — Patient Instructions (Signed)
Medication Instructions:  Your physician has recommended you make the following change in your medication:  - restart Jardiance 10 mg tablets once daily - restart Spironolactone 12.5 mg tablets once daily   Labwork: TODAY: -BMET -CBC  IN 2 WEEKS: -BMET  Testing/Procedures: None  Follow-Up: Follow up with Dr. Dellia Cloud in 1 month   Any Other Special Instructions Will Be Listed Below (If Applicable).     If you need a refill on your cardiac medications before your next appointment, please call your pharmacy.
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
## 2022-04-03 ENCOUNTER — Encounter: Payer: Self-pay | Admitting: *Deleted

## 2022-04-03 ENCOUNTER — Telehealth: Payer: Self-pay | Admitting: *Deleted

## 2022-04-03 NOTE — Patient Outreach (Signed)
  Care Coordination   Follow Up Visit Note   04/03/2022 Name: Kevin Smith MRN: [id number] DOB: 01/12/1956  Kevin Smith is a 67 y.o. year old male who sees [name], FNP for primary care. I spoke with  [name] by phone today.  What matters to the patients health and wellness today?  Getting relief for my affected eye.    Goals Addressed             This Visit's Progress    Care Coordination Activites -follow up on low blood pressure       Care Coordination Interventions: Reviewed medications with patient and discussed importance of compliance Advised patient, providing education and rationale, to monitor blood pressure daily and record, calling PCP for findings outside established parameters  Pt hypotension has resolved. BP today was 121/86. He was seen in his cardiologist's office on 03/31/21. His valsartan, jardiance, and spironolactone 12.5 mg were resumed. He is still holding his furosemide. He will follow up with his primary care provider on 04/10/22.         GET PAIN RELIEF OF SHINGLES OVER THE NEXT 2 WEEKS.       Care Coordination Interventions: Pt received gabapentin requested and has been using it tid. Pain level is better but still present 5-6/10. He reports his eye is very uncomfortable, "It feels like something is in it, it's very irritated and very sensitive to light. I covered my eye with my hand when I was watching football." Pt states he is going back to the optometrist today. He says she wanted him to get some eye drops but he didn't because they were too expensive. NP advised that if he gets an RX we can try to see if there is a pharmacy assistance program for the medication. NP also advised that he should see an ophthalmologist to ensure his [place] for ocular shingles in adequate. Pt to call NP after appt today.        IMPROVE MY SELF MANAGEMENT OF MY CARDIOVASCULAR PROBLEMS: HTN CAD, AFIB, HYPERLIPIDEMA       Care Coordination Interventions: Please  see interventions in follow up low blood pressure.          SDOH assessments and interventions completed:  Yes  PREVIOUSLY ASSESSED.   Care Coordination Interventions:  Yes, provided   Follow up plan: Follow up call scheduled for 2 weeks.    Encounter Outcome:  Pt. Visit Completed   Kayleen Memos C. Myrtie Neither, MSN, [facility] Gerontological Nurse Practitioner [facility] Care Management [phone]

## 2022-04-04 ENCOUNTER — Telehealth: Payer: Self-pay | Admitting: *Deleted

## 2022-04-04 DIAGNOSIS — I4891 Unspecified atrial fibrillation: Secondary | ICD-10-CM | POA: Diagnosis not present

## 2022-04-04 DIAGNOSIS — J441 Chronic obstructive pulmonary disease with (acute) exacerbation: Secondary | ICD-10-CM | POA: Diagnosis not present

## 2022-04-04 DIAGNOSIS — M1990 Unspecified osteoarthritis, unspecified site: Secondary | ICD-10-CM | POA: Diagnosis not present

## 2022-04-04 DIAGNOSIS — I5022 Chronic systolic (congestive) heart failure: Secondary | ICD-10-CM | POA: Diagnosis not present

## 2022-04-04 DIAGNOSIS — M791 Myalgia, unspecified site: Secondary | ICD-10-CM | POA: Diagnosis not present

## 2022-04-04 DIAGNOSIS — I2510 Atherosclerotic heart disease of native coronary artery without angina pectoris: Secondary | ICD-10-CM | POA: Diagnosis not present

## 2022-04-04 DIAGNOSIS — I110 Hypertensive heart disease with heart failure: Secondary | ICD-10-CM | POA: Diagnosis not present

## 2022-04-04 DIAGNOSIS — J101 Influenza due to other identified influenza virus with other respiratory manifestations: Secondary | ICD-10-CM | POA: Diagnosis not present

## 2022-04-04 DIAGNOSIS — B0230 Zoster ocular disease, unspecified: Secondary | ICD-10-CM

## 2022-04-04 DIAGNOSIS — I719 Aortic aneurysm of unspecified site, without rupture: Secondary | ICD-10-CM | POA: Diagnosis not present

## 2022-04-04 NOTE — Telephone Encounter (Signed)
Kevin Smith, DOB: 03/27/1955  Mr. Sturgell requests assistance to get urgent ophthalmology appt for herpes zoster of L eye. Arranged appt today at [facility] for tomorrow at 9:40 am.  Notified pt and he needs transportation. Request transportation with [facility] Concierge: Advised will put request in but usually it takes 72 hours in advance. Request urgent need for this appt to prevent complications or an ED visit. Pt hopefully will be advised this evening if the transportation can be arranged.  Pt advised to call NP if there is no available transportation.  Eulah Pont. Myrtie Neither, MSN, [facility] Gerontological Nurse Practitioner [facility] Care Management [phone]

## 2022-04-04 NOTE — Patient Outreach (Signed)
This is an erroneous note. See other for today 04/04/22.  Eulah Pont. Myrtie Neither, MSN, [facility] Gerontological Nurse Practitioner [facility] Care Management [phone]

## 2022-04-05 ENCOUNTER — Other Ambulatory Visit: Payer: Self-pay | Admitting: *Deleted

## 2022-04-05 ENCOUNTER — Telehealth: Payer: Self-pay | Admitting: *Deleted

## 2022-04-05 DIAGNOSIS — I25700 Atherosclerosis of coronary artery bypass graft(s), unspecified, with unstable angina pectoris: Secondary | ICD-10-CM

## 2022-04-05 DIAGNOSIS — F339 Major depressive disorder, recurrent, unspecified: Secondary | ICD-10-CM

## 2022-04-05 DIAGNOSIS — B0230 Zoster ocular disease, unspecified: Secondary | ICD-10-CM

## 2022-04-05 DIAGNOSIS — B0050 Herpesviral ocular disease, unspecified: Secondary | ICD-10-CM | POA: Diagnosis not present

## 2022-04-05 NOTE — Patient Outreach (Signed)
  Care Coordination   Home Visit Note   04/05/2022 Name: Kevin Smith MRN: [id number] DOB: 1955/10/13  Kevin Smith is a 67 y.o. year old male who sees Kevin Perking, FNP for primary care. I visited  Kevin Smith in their home today.  What matters to the patients health and wellness today?  Getting my eye looked at.    Goals Addressed             This Visit's Progress    Care Coordination Activites -follow up on low blood pressure       Care Coordination Interventions: Evaluation of current treatment plan related to hypertension self management and patient's adherence to plan as established by provider Reviewed medications with patient and discussed importance of compliance Counseled on adverse effects of illicit drug and excessive alcohol use in patients with high blood pressure  Counseled on the importance of exercise goals with target of 150 minutes per week Discussed plans with patient for ongoing care management follow up and provided patient with direct contact information for care management team Advised patient, providing education and rationale, to monitor blood pressure daily and record, calling PCP for findings outside established parameters Reviewed scheduled/upcoming provider appointments including:  Provided education on prescribed diet Heart healthy lower carbs.  Today's sitting BPwas 127/78, standing 138/79. Pt did have what seemed to be a slight hypotensive episode after his eye exam. He was coming out of the office and swayed and stumbled a bit but leaned against a wall and his compainion was there to also support him. The episode was brieif and he quickly recovered.      GET PAIN RELIEF OF SHINGLES OVER THE NEXT 2 WEEKS.       Care Coordination Interventions: Arranged URGENT ophthalmology appt for today.  Arranged transportion Accompanied pt to OV. PT and NP received instructions for pt eye care. MD said it was important to seek out urgent care to prevent  further eye damage and loss of vision. He was given a new Rx or Valcyclovier to take daily and was advised he will have to be on this for at least a month and perhaps the rest of his life to prevent reoccurrence. Made referral to Pharmacy Assistance for Valcyclovier, Eliquis, [place] and Prestiq. Assisted pt make f/u appt and arrange for transportation via [facility].          SDOH assessments and interventions completed:  Yes  SDOH Interventions Today    Flowsheet Row Most Recent Value  SDOH Interventions   Transportation Interventions Payor Benefit  Financial Strain Interventions Other (Comment)  [Referred for pharmacy assistance of 4 meds.]        Care Coordination Interventions:  Yes, provided   Follow up plan: Follow up call scheduled for next Friday.    Encounter Outcome:  Pt. Visit Completed   Kayleen Memos C. Myrtie Neither, MSN, [facility] Gerontological Nurse Practitioner [facility] Care Management [phone]

## 2022-04-06 DIAGNOSIS — I110 Hypertensive heart disease with heart failure: Secondary | ICD-10-CM | POA: Diagnosis not present

## 2022-04-06 DIAGNOSIS — M1990 Unspecified osteoarthritis, unspecified site: Secondary | ICD-10-CM | POA: Diagnosis not present

## 2022-04-06 DIAGNOSIS — M791 Myalgia, unspecified site: Secondary | ICD-10-CM | POA: Diagnosis not present

## 2022-04-06 DIAGNOSIS — I2510 Atherosclerotic heart disease of native coronary artery without angina pectoris: Secondary | ICD-10-CM | POA: Diagnosis not present

## 2022-04-06 DIAGNOSIS — I4891 Unspecified atrial fibrillation: Secondary | ICD-10-CM | POA: Diagnosis not present

## 2022-04-06 DIAGNOSIS — J101 Influenza due to other identified influenza virus with other respiratory manifestations: Secondary | ICD-10-CM | POA: Diagnosis not present

## 2022-04-06 DIAGNOSIS — I5022 Chronic systolic (congestive) heart failure: Secondary | ICD-10-CM | POA: Diagnosis not present

## 2022-04-06 DIAGNOSIS — J441 Chronic obstructive pulmonary disease with (acute) exacerbation: Secondary | ICD-10-CM | POA: Diagnosis not present

## 2022-04-06 DIAGNOSIS — I719 Aortic aneurysm of unspecified site, without rupture: Secondary | ICD-10-CM | POA: Diagnosis not present

## 2022-04-07 ENCOUNTER — Other Ambulatory Visit: Payer: Self-pay | Admitting: Family Medicine

## 2022-04-07 ENCOUNTER — Other Ambulatory Visit: Payer: Self-pay | Admitting: *Deleted

## 2022-04-07 DIAGNOSIS — F5101 Primary insomnia: Secondary | ICD-10-CM

## 2022-04-07 DIAGNOSIS — G2581 Restless legs syndrome: Secondary | ICD-10-CM

## 2022-04-07 MED ORDER — ROPINIROLE HCL 1 MG PO TABS: 2.0000 mg | ORAL_TABLET | Freq: Every day | ORAL | 0 refills | Status: DC

## 2022-04-10 ENCOUNTER — Encounter: Payer: Self-pay | Admitting: Family Medicine

## 2022-04-10 ENCOUNTER — Ambulatory Visit (INDEPENDENT_AMBULATORY_CARE_PROVIDER_SITE_OTHER): Payer: Medicare HMO | Admitting: Family Medicine

## 2022-04-10 VITALS — BP 110/74 | HR 75 | Temp 98.4°F | Ht 71.0 in | Wt 233.0 lb

## 2022-04-10 DIAGNOSIS — F5101 Primary insomnia: Secondary | ICD-10-CM | POA: Diagnosis not present

## 2022-04-10 DIAGNOSIS — I110 Hypertensive heart disease with heart failure: Secondary | ICD-10-CM

## 2022-04-10 DIAGNOSIS — I10 Essential (primary) hypertension: Secondary | ICD-10-CM

## 2022-04-10 DIAGNOSIS — I5020 Unspecified systolic (congestive) heart failure: Secondary | ICD-10-CM | POA: Diagnosis not present

## 2022-04-10 DIAGNOSIS — R42 Dizziness and giddiness: Secondary | ICD-10-CM

## 2022-04-10 DIAGNOSIS — F332 Major depressive disorder, recurrent severe without psychotic features: Secondary | ICD-10-CM

## 2022-04-10 DIAGNOSIS — I25709 Atherosclerosis of coronary artery bypass graft(s), unspecified, with unspecified angina pectoris: Secondary | ICD-10-CM | POA: Diagnosis not present

## 2022-04-10 DIAGNOSIS — J439 Emphysema, unspecified: Secondary | ICD-10-CM | POA: Insufficient documentation

## 2022-04-10 DIAGNOSIS — E785 Hyperlipidemia, unspecified: Secondary | ICD-10-CM

## 2022-04-10 DIAGNOSIS — I480 Paroxysmal atrial fibrillation: Secondary | ICD-10-CM

## 2022-04-10 DIAGNOSIS — Z951 Presence of aortocoronary bypass graft: Secondary | ICD-10-CM

## 2022-04-10 DIAGNOSIS — F419 Anxiety disorder, unspecified: Secondary | ICD-10-CM

## 2022-04-10 DIAGNOSIS — K219 Gastro-esophageal reflux disease without esophagitis: Secondary | ICD-10-CM

## 2022-04-10 DIAGNOSIS — I252 Old myocardial infarction: Secondary | ICD-10-CM

## 2022-04-10 MED ORDER — DESVENLAFAXINE SUCCINATE ER 100 MG PO TB24: 100.0000 mg | ORAL_TABLET | Freq: Every day | ORAL | 1 refills | Status: DC

## 2022-04-10 MED ORDER — ZOLPIDEM TARTRATE 5 MG PO TABS: 5.0000 mg | ORAL_TABLET | Freq: Every evening | ORAL | 2 refills | Status: DC | PRN

## 2022-04-10 NOTE — Progress Notes (Signed)
Established Patient Office Visit  Subjective   Patient ID: Ivory Bail, male    DOB: Oct 21, 1955  Age: 67 y.o. MRN: [id number]  Chief Complaint  Patient presents with   Medical Management of Chronic Issues    HPI [place] is here for a chronic follow up.   He is feeling lightheaded today and a little nauseous. This started when he arrived for his visit today. He denies chest pain, shortness of breath, palpitations, focal weakness, changes in vision, edema, vomiting, or confusion. He is fasting today for last and has only had 1 bottle of water in the last 24 hours. His BP was 133/78 this morning before taking his BP medications.   He is established with cardiology for CAD, a.fib, and chronic HF.  His last visit was 10 days ago. At that time his BP was soft and his spironolactone dosage was decreased to 12.5 mg. At first he reported that he was taking 25 mg still, but then reported that he had been taking the 1/2 dosage for the last 4 days.   He is due for refills of Ambien. He is prescribed 5 mg. He reports that it still takes him 3 hours or so to fall asleep after taking this. He sleep for 7-8 hours once asleep. He reports that his mind won't stop and this is why he has a hard time sleeping. His UDS is UTD.   He is currently on pristiq 50 mg daily for anxiety and depression. Reports fair control.      04/10/2022   10:21 AM 03/15/2022    1:11 PM 01/23/2022    2:01 PM  Depression screen PHQ 2/9  Decreased Interest 1 1 1   Down, Depressed, Hopeless 1 2 2   PHQ - 2 Score 2 3 3   Altered sleeping 1 2 3   Tired, decreased energy 1 2 3   Change in appetite 0 1 0  Feeling bad or failure about yourself  1 2 3   Trouble concentrating 0 0 1  Moving slowly or fidgety/restless 1 0 1  Suicidal thoughts 0 0 0  PHQ-9 Score 6 10 14   Difficult doing work/chores Somewhat difficult Somewhat difficult Somewhat difficult      04/10/2022   10:22 AM 03/15/2022    1:11 PM 01/23/2022    2:01 PM 01/09/2022    11:55 AM  GAD 7 : Generalized Anxiety Score  Nervous, Anxious, on Edge 1 2 3 3   Control/stop worrying 1 2 1 1   Worry too much - different things 1 2 1 1   Trouble relaxing 1 1 1 1   Restless 1 1 1 1   Easily annoyed or irritable 0 1 3 3   Afraid - awful might happen 1 1 2 2   Total GAD 7 Score 6 10 12 12   Anxiety Difficulty Somewhat difficult Somewhat difficult Somewhat difficult Somewhat difficult       ROS As per HPI.   Objective:     BP 110/74   Pulse 75   Temp 98.4 F (36.9 C) (Temporal)   Ht 5\' 11"  (1.803 m)   Wt 233 lb (105.7 kg)   SpO2 95%   BMI 32.50 kg/m  BP Readings from Last 3 Encounters:  04/10/22 110/74  03/31/22 130/86  03/15/22 (!) 91/59      Physical Exam Vitals and nursing note reviewed.  Constitutional:      Appearance: He is not ill-appearing, toxic-appearing or diaphoretic.  Cardiovascular:     Rate and Rhythm: Normal rate and regular rhythm.  Heart sounds: Normal heart sounds. No murmur heard. Pulmonary:     Effort: No respiratory distress.     Breath sounds: Normal breath sounds. No wheezing.  Abdominal:     General: Bowel sounds are normal. There is no distension.     Palpations: Abdomen is soft.     Tenderness: There is no abdominal tenderness. There is no guarding or rebound.  Musculoskeletal:     Cervical back: Neck supple. No rigidity.     Right lower leg: No edema.     Left lower leg: No edema.  Skin:    General: Skin is warm and dry.  Neurological:     General: No focal deficit present.     Mental Status: He is alert and oriented to person, place, and time.  Psychiatric:        Mood and Affect: Mood normal.        Behavior: Behavior normal.      No results found for any visits on 04/10/22.    The ASCVD Risk score (Arnett DK, et al., 2019) failed to calculate for the following reasons:   The patient has a prior MI or stroke diagnosis    Assessment & Plan:   Digby was seen today for medical management of chronic  issues.  Diagnoses and all orders for this visit:  Primary hypertension Dizziness -     EKG 12-Lead BP soft today with dizziness. Improved to 110/74 after 2 glasses of water. EKG with SR today. Discussed hydration and to only take 1/2 tablet of spirolactone for 12.5 mg daily as ordered by cardiology. He will have CBC and BMP done today that was ordered by cardiology. Continue to monitor BP at home and notify for high or low readings.   Hyperlipidemia with target LDL less than 70 Recent LDL was 43. On lipitor and zetia.   Paroxysmal atrial fibrillation ([place]) Managed by cardiology. On eliquis, plavix, and metoprolol.   Coronary artery disease involving 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 (HCC) S/P CABG x 3 History of ST elevation myocardial infarction (STEMI) HFrEF (heart failure with reduced ejection fraction) ([place]) Managed by cariology. On plavix, valsartan, jardiance, toprolol, and spirolactone. 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 Increase pristiq to 100 mg daily.  -     desvenlafaxine (PRISTIQ) 100 MG 24 hr tablet; Take 1 tablet (100 mg total) by mouth daily.  Primary insomnia CSA signed today. UDS is UTD. Refills provided. Discussed unable to increase dosage due to risks. Will increase pristiq for better control of anxiety as he reports that is what keeps him from falling asleep.  -     zolpidem (AMBIEN) 5 MG tablet; Take 1 tablet (5 mg total) by mouth at bedtime as needed. for sleep  Gastroesophageal reflux disease without esophagitis Well controlled on current regimen.    Return in about 6 weeks (around 05/22/2022) for medication follow up.   The patient indicates understanding of these issues and agrees with the plan.  [name], FNP

## 2022-04-10 NOTE — Patient Instructions (Signed)
Dizziness Dizziness is a common problem. It is a feeling of unsteadiness or light-headedness. You may feel like you are about to faint. Dizziness can lead to injury if you stumble or fall. Anyone can become dizzy, but dizziness is more common in older adults. This condition can be caused by a number of things, including medicines, dehydration, or illness. Follow these instructions at home: Eating and drinking  Drink enough fluid to keep your urine pale yellow. This helps to keep you from becoming dehydrated. Try to drink more clear fluids, such as water. Do not drink alcohol. Limit your caffeine intake if told to do so by your health care provider. Check ingredients and nutrition facts to see if a food or beverage contains caffeine. Limit your salt (sodium) intake if told to do so by your health care provider. Check ingredients and nutrition facts to see if a food or beverage contains sodium. Activity  Avoid making quick movements. Rise slowly from chairs and steady yourself until you feel okay. In the morning, first sit up on the side of the bed. When you feel okay, stand slowly while you hold onto something until you know that your balance is good. If you need to stand in one place for a long time, move your legs often. Tighten and relax the muscles in your legs while you are standing. Do not drive or use machinery if you feel dizzy. Avoid bending down if you feel dizzy. Place items in your home so that they are easy for you to reach without leaning over. Lifestyle Do not use any products that contain nicotine or tobacco. These products include cigarettes, chewing tobacco, and vaping devices, such as e-cigarettes. If you need help quitting, ask your health care provider. Try to reduce your stress level by using methods such as yoga or meditation. Talk with your health care provider if you need help to manage your stress. General instructions Watch your dizziness for any changes. Take  over-the-counter and prescription medicines only as told by your health care provider. Talk with your health care provider if you think that your dizziness is caused by a medicine that you are taking. Tell a friend or a family member that you are feeling dizzy. If he or she notices any changes in your behavior, have this person call your health care provider. Keep all follow-up visits. This is important. Contact a health care provider if: Your dizziness does not go away or you have new symptoms. Your dizziness or light-headedness gets worse. You feel nauseous. You have reduced hearing. You have a fever. You have neck pain or a stiff neck. Your dizziness leads to an injury or a fall. Get help right away if: You vomit or have diarrhea and are unable to eat or drink anything. You have problems talking, walking, swallowing, or using your arms, hands, or legs. You feel generally weak. You have any bleeding. You are not thinking clearly or you have trouble forming sentences. It may take a friend or family member to notice this. You have chest pain, abdominal pain, shortness of breath, or sweating. Your vision changes or you develop a severe headache. These symptoms may represent a serious problem that is an emergency. Do not wait to see if the symptoms will go away. Get medical help right away. Call your local emergency services (911 in the [place] Do not drive yourself to the hospital. Summary Dizziness is a feeling of unsteadiness or light-headedness. This condition can be caused by a number of   things, including medicines, dehydration, or illness. Anyone can become dizzy, but dizziness is more common in older adults. Drink enough fluid to keep your urine pale yellow. Do not drink alcohol. Avoid making quick movements if you feel dizzy. Monitor your dizziness for any changes. This information is not intended to replace advice given to you by your health care provider. Make sure you discuss any  questions you have with your health care provider. Document Revised: 02/02/2020 Document Reviewed: 02/02/2020 Elsevier Patient Education  2023 Elsevier Inc.  

## 2022-04-10 NOTE — Addendum Note (Signed)
Addended by: Atari Novick on: 04/10/2022 11:10 AM   Modules accepted: Orders  

## 2022-04-10 NOTE — Addendum Note (Signed)
Addended by: Memory Argue on: 04/10/2022 11:10 AM   Modules accepted: Orders

## 2022-04-11 DIAGNOSIS — J441 Chronic obstructive pulmonary disease with (acute) exacerbation: Secondary | ICD-10-CM | POA: Diagnosis not present

## 2022-04-11 DIAGNOSIS — M1990 Unspecified osteoarthritis, unspecified site: Secondary | ICD-10-CM | POA: Diagnosis not present

## 2022-04-11 DIAGNOSIS — J101 Influenza due to other identified influenza virus with other respiratory manifestations: Secondary | ICD-10-CM | POA: Diagnosis not present

## 2022-04-11 DIAGNOSIS — M791 Myalgia, unspecified site: Secondary | ICD-10-CM | POA: Diagnosis not present

## 2022-04-11 DIAGNOSIS — I110 Hypertensive heart disease with heart failure: Secondary | ICD-10-CM | POA: Diagnosis not present

## 2022-04-11 DIAGNOSIS — I5022 Chronic systolic (congestive) heart failure: Secondary | ICD-10-CM | POA: Diagnosis not present

## 2022-04-11 DIAGNOSIS — I2510 Atherosclerotic heart disease of native coronary artery without angina pectoris: Secondary | ICD-10-CM | POA: Diagnosis not present

## 2022-04-11 DIAGNOSIS — I719 Aortic aneurysm of unspecified site, without rupture: Secondary | ICD-10-CM | POA: Diagnosis not present

## 2022-04-11 DIAGNOSIS — I4891 Unspecified atrial fibrillation: Secondary | ICD-10-CM | POA: Diagnosis not present

## 2022-04-11 LAB — CBC
Hematocrit: 46.3 % (ref 37.5–51.0)
Hemoglobin: 15.9 g/dL (ref 13.0–17.7)
MCH: 31.4 pg (ref 26.6–33.0)
MCHC: 34.3 g/dL (ref 31.5–35.7)
MCV: 92 fL (ref 79–97)
Platelets: 278 10*3/uL (ref 150–450)
RBC: 5.06 x10E6/uL (ref 4.14–5.80)
RDW: 13.7 % (ref 11.6–15.4)
WBC: 9.7 10*3/uL (ref 3.4–10.8)

## 2022-04-11 LAB — BASIC METABOLIC PANEL
BUN/Creatinine Ratio: 14 (ref 10–24)
BUN: 21 mg/dL (ref 8–27)
CO2: 18 mmol/L — ABNORMAL LOW (ref 20–29)
Calcium: 9.6 mg/dL (ref 8.6–10.2)
Chloride: 100 mmol/L (ref 96–106)
Creatinine, Ser: 1.49 mg/dL — ABNORMAL HIGH (ref 0.76–1.27)
Glucose: 100 mg/dL — ABNORMAL HIGH (ref 70–99)
Potassium: 3.9 mmol/L (ref 3.5–5.2)
Sodium: 141 mmol/L (ref 134–144)
eGFR: 52 mL/min/{1.73_m2} — ABNORMAL LOW (ref >59)

## 2022-04-12 ENCOUNTER — Telehealth: Payer: Self-pay | Admitting: Internal Medicine

## 2022-04-12 ENCOUNTER — Telehealth: Payer: Self-pay

## 2022-04-12 NOTE — Progress Notes (Signed)
  Care Coordination  Note  04/12/2022 Name: Hosteen Kienast MRN: [id number] DOB: 08/11/1955  Calub Tarnow is a 67 y.o. year old male who is a primary care patient of [name], FNP. I reached out to [place] by phone today to offer care coordination services.      Mr. Shin was given information about Care Coordination services today including:  The Care Coordination services include support from the care team which includes your Nurse Coordinator, Clinical Social Worker, or Pharmacist.  The Care Coordination team is here to help remove barriers to the health concerns and goals most important to you. Care Coordination services are voluntary and the patient may decline or stop services at any time by request to their care team member.   Patient agreed to services and verbal consent obtained.   Follow up plan: Telephone appointment with care coordination team member scheduled for:05/11/2022  Noreene Larsson, [place], [place] [address] Direct Dial: [phone] [email or web address]

## 2022-04-12 NOTE — Telephone Encounter (Signed)
Calling to go over the patient medication because he is having low bp. Please advise

## 2022-04-12 NOTE — Telephone Encounter (Signed)
Spoke to [place] and went over patients Cardiac medications from previous ov with C. Kathlen Mody, PA-C. Raquel Sarna stated that pt's bp is still dropping too low. Readings have been between 110/60 and 88/58. Raquel Sarna reports the patient feels very poorly and is questioning if something needs to be lowered more or d/c'd. Please advise.

## 2022-04-13 DIAGNOSIS — M791 Myalgia, unspecified site: Secondary | ICD-10-CM | POA: Diagnosis not present

## 2022-04-13 DIAGNOSIS — I719 Aortic aneurysm of unspecified site, without rupture: Secondary | ICD-10-CM | POA: Diagnosis not present

## 2022-04-13 DIAGNOSIS — I5022 Chronic systolic (congestive) heart failure: Secondary | ICD-10-CM | POA: Diagnosis not present

## 2022-04-13 DIAGNOSIS — J441 Chronic obstructive pulmonary disease with (acute) exacerbation: Secondary | ICD-10-CM | POA: Diagnosis not present

## 2022-04-13 DIAGNOSIS — M1990 Unspecified osteoarthritis, unspecified site: Secondary | ICD-10-CM | POA: Diagnosis not present

## 2022-04-13 DIAGNOSIS — J101 Influenza due to other identified influenza virus with other respiratory manifestations: Secondary | ICD-10-CM | POA: Diagnosis not present

## 2022-04-13 DIAGNOSIS — I4891 Unspecified atrial fibrillation: Secondary | ICD-10-CM | POA: Diagnosis not present

## 2022-04-13 DIAGNOSIS — I110 Hypertensive heart disease with heart failure: Secondary | ICD-10-CM | POA: Diagnosis not present

## 2022-04-13 DIAGNOSIS — I2510 Atherosclerotic heart disease of native coronary artery without angina pectoris: Secondary | ICD-10-CM | POA: Diagnosis not present

## 2022-04-14 ENCOUNTER — Encounter: Payer: Self-pay | Admitting: *Deleted

## 2022-04-14 ENCOUNTER — Ambulatory Visit: Payer: Self-pay

## 2022-04-14 DIAGNOSIS — M791 Myalgia, unspecified site: Secondary | ICD-10-CM | POA: Diagnosis not present

## 2022-04-14 DIAGNOSIS — I4891 Unspecified atrial fibrillation: Secondary | ICD-10-CM | POA: Diagnosis not present

## 2022-04-14 DIAGNOSIS — I110 Hypertensive heart disease with heart failure: Secondary | ICD-10-CM | POA: Diagnosis not present

## 2022-04-14 DIAGNOSIS — I719 Aortic aneurysm of unspecified site, without rupture: Secondary | ICD-10-CM | POA: Diagnosis not present

## 2022-04-14 DIAGNOSIS — I5022 Chronic systolic (congestive) heart failure: Secondary | ICD-10-CM | POA: Diagnosis not present

## 2022-04-14 DIAGNOSIS — I2510 Atherosclerotic heart disease of native coronary artery without angina pectoris: Secondary | ICD-10-CM | POA: Diagnosis not present

## 2022-04-14 DIAGNOSIS — J441 Chronic obstructive pulmonary disease with (acute) exacerbation: Secondary | ICD-10-CM | POA: Diagnosis not present

## 2022-04-14 DIAGNOSIS — J101 Influenza due to other identified influenza virus with other respiratory manifestations: Secondary | ICD-10-CM | POA: Diagnosis not present

## 2022-04-14 DIAGNOSIS — M1990 Unspecified osteoarthritis, unspecified site: Secondary | ICD-10-CM | POA: Diagnosis not present

## 2022-04-14 NOTE — Patient Instructions (Signed)
Visit Information  Thank you for taking time to visit with me today. Please don't hesitate to contact me if I can be of assistance to you.   Following are the goals we discussed today:   Goals Addressed             This Visit's Progress    Care Coordination Activities - [facility] Transportation       Care Coordination Interventions: Reviewed health plan benefit with the patient Verbal and written education provided on [facility] resources to assist with transportation Scheduled follow up call to confirm receipt of mailed resource information           Our next appointment is by telephone on 2/16 at 3:00 pm   Please call the care guide team at [phone] if you need to cancel or reschedule your appointment.   If you are experiencing a Mental Health or [place] or need someone to talk to, please call 911  The patient verbalized understanding of instructions, educational materials, and care plan provided today and DECLINED offer to receive copy of patient instructions, educational materials, and care plan.   Telephone follow up appointment with care management team member scheduled for:2/16  Daneen Schick, Arita Miss, CDP Social Worker, Certified Dementia Practitioner [place] Management  Care Coordination [phone]

## 2022-04-14 NOTE — Patient Outreach (Addendum)
  Care Coordination   Follow Up Visit Note   04/14/2022 Name: Kevin Smith MRN: [id number] DOB: 08/12/1955  Kevin Smith is a 67 y.o. year old male who sees Kevin Perking, FNP for primary care. I spoke with  Kevin Smith by phone today.  What matters to the patients health and wellness today?  Transportation resource education    Goals Addressed             This Visit's Progress    Care Coordination Activities - [facility] Transportation       Care Coordination Interventions: Reviewed health plan benefit with the patient Verbal and written education provided on [facility] resources to assist with transportation Scheduled follow up call to confirm receipt of mailed resource information        SDOH assessments and interventions completed:  Yes  SDOH Interventions Today    Flowsheet Row Most Recent Value  SDOH Interventions   Transportation Interventions Other (Comment)  [Education on RCATS and SKAT]        Care Coordination Interventions:  Yes, provided   Interventions Today    Flowsheet Row Most Recent Value  Chronic Disease Discussed/Reviewed   Chronic disease discussed/reviewed during today's visit Hypertension (HTN), Chronic Obstructive Pulmonary Disease (COPD)  General Interventions   General Interventions Discussed/Reviewed General Interventions Discussed  Education Interventions   Education Provided Provided Engineer, site, Provided Verbal Education  Provided Verbal Education On --  [employer] resource: transportation]        Follow up plan: Follow up call scheduled for 04/28/22    Encounter Outcome:  Pt. Visit Completed   Daneen Schick, Arita Miss, CDP Social Worker, Certified Dementia Practitioner [place] Management  Care Coordination [phone]

## 2022-04-17 ENCOUNTER — Encounter: Payer: Self-pay | Admitting: *Deleted

## 2022-04-17 ENCOUNTER — Telehealth: Payer: Self-pay | Admitting: *Deleted

## 2022-04-17 DIAGNOSIS — B0050 Herpesviral ocular disease, unspecified: Secondary | ICD-10-CM | POA: Diagnosis not present

## 2022-04-17 NOTE — Patient Outreach (Signed)
  Care Coordination   Follow Up Visit Note   04/17/2022 Name: Kevin Smith MRN: [id number] DOB: August 22, 1955  Kevin Smith is a 67 y.o. year old male who sees [name], FNP for primary care. I spoke with  [name] by phone today.  What matters to the patients health and wellness today?  Eye is better, no pain, still blurry. Having nausea and poor appetite lately.    Goals Addressed             This Visit's Progress    COMPLETED: Care Coordination Activites -follow up on low blood pressure   On track    Interventions Today    Flowsheet Row Most Recent Value  Chronic Disease Discussed/Reviewed   Chronic disease discussed/reviewed during today's visit Other  [Hypotension has resolved with discontinuation of furosemide.]  General Interventions   General Interventions Discussed/Reviewed General Interventions Discussed  [Contiue monitoring BP and report <100 systolc readig for medication adjustment.]           COMPLETED: GET PAIN RELIEF OF SHINGLES OVER THE NEXT 2 WEEKS.   On track    Interventions Today    Flowsheet Row Most Recent Value  Chronic Disease Discussed/Reviewed   Chronic disease discussed/reviewed during today's visit Other  [Shingles L eye]  General Interventions   General Interventions Discussed/Reviewed General Interventions Discussed  [Pt pain is resolved. Continues antiviral and has had f/u opthalmic exam Next exam in another 2 weeks. Continue steroid eye gtts.]           I want to feel better emotionally, less depressed by the end of 30 days.       Interventions Today    Flowsheet Row Most Recent Value  Chronic Disease Discussed/Reviewed   Chronic disease discussed/reviewed during today's visit Other  [Depression]  General Interventions   General Interventions Discussed/Reviewed General Interventions Discussed  [Pt reports some suicidal ideation but states he would not do anything and does not have a plan. He is isolated, but doesn't want to  do anything. PCP increased his med to 100 mg. NP provided crisis number [phone] for worsening sxs. F/U in 4 days.]              SDOH assessments and interventions completed:  Yes   previously assessed. Still needs transportation and [employer] does provide this.  Care Coordination Interventions:  Yes, provided   Follow up plan: Follow up call scheduled for Thursday.    Encounter Outcome:  Pt. Visit Completed

## 2022-04-18 DIAGNOSIS — J101 Influenza due to other identified influenza virus with other respiratory manifestations: Secondary | ICD-10-CM | POA: Diagnosis not present

## 2022-04-18 DIAGNOSIS — J441 Chronic obstructive pulmonary disease with (acute) exacerbation: Secondary | ICD-10-CM | POA: Diagnosis not present

## 2022-04-18 DIAGNOSIS — I4891 Unspecified atrial fibrillation: Secondary | ICD-10-CM | POA: Diagnosis not present

## 2022-04-18 DIAGNOSIS — I5022 Chronic systolic (congestive) heart failure: Secondary | ICD-10-CM | POA: Diagnosis not present

## 2022-04-18 DIAGNOSIS — I110 Hypertensive heart disease with heart failure: Secondary | ICD-10-CM | POA: Diagnosis not present

## 2022-04-18 DIAGNOSIS — I719 Aortic aneurysm of unspecified site, without rupture: Secondary | ICD-10-CM | POA: Diagnosis not present

## 2022-04-18 DIAGNOSIS — M791 Myalgia, unspecified site: Secondary | ICD-10-CM | POA: Diagnosis not present

## 2022-04-18 DIAGNOSIS — M1990 Unspecified osteoarthritis, unspecified site: Secondary | ICD-10-CM | POA: Diagnosis not present

## 2022-04-18 DIAGNOSIS — I2510 Atherosclerotic heart disease of native coronary artery without angina pectoris: Secondary | ICD-10-CM | POA: Diagnosis not present

## 2022-04-19 ENCOUNTER — Telehealth: Payer: Self-pay | Admitting: Family Medicine

## 2022-04-19 DIAGNOSIS — I2510 Atherosclerotic heart disease of native coronary artery without angina pectoris: Secondary | ICD-10-CM | POA: Diagnosis not present

## 2022-04-19 DIAGNOSIS — J441 Chronic obstructive pulmonary disease with (acute) exacerbation: Secondary | ICD-10-CM | POA: Diagnosis not present

## 2022-04-19 DIAGNOSIS — M1990 Unspecified osteoarthritis, unspecified site: Secondary | ICD-10-CM | POA: Diagnosis not present

## 2022-04-19 DIAGNOSIS — I110 Hypertensive heart disease with heart failure: Secondary | ICD-10-CM | POA: Diagnosis not present

## 2022-04-19 DIAGNOSIS — I4891 Unspecified atrial fibrillation: Secondary | ICD-10-CM | POA: Diagnosis not present

## 2022-04-19 DIAGNOSIS — J101 Influenza due to other identified influenza virus with other respiratory manifestations: Secondary | ICD-10-CM | POA: Diagnosis not present

## 2022-04-19 DIAGNOSIS — I719 Aortic aneurysm of unspecified site, without rupture: Secondary | ICD-10-CM | POA: Diagnosis not present

## 2022-04-19 DIAGNOSIS — I5022 Chronic systolic (congestive) heart failure: Secondary | ICD-10-CM | POA: Diagnosis not present

## 2022-04-19 DIAGNOSIS — M791 Myalgia, unspecified site: Secondary | ICD-10-CM | POA: Diagnosis not present

## 2022-04-20 ENCOUNTER — Encounter: Payer: Self-pay | Admitting: *Deleted

## 2022-04-20 ENCOUNTER — Telehealth: Payer: Self-pay | Admitting: *Deleted

## 2022-04-20 NOTE — Telephone Encounter (Signed)
Scheduled with DOD tomorrow

## 2022-04-20 NOTE — Patient Outreach (Addendum)
  Care Coordination   Follow Up Visit Note   04/20/2022 Name: Kevin Smith MRN: [id number] DOB: Feb 24, 1956  Kevin Smith is a 67 y.o. year old male who sees Kevin Perking, FNP for primary care. I spoke with  Kevin Smith by phone today.  What matters to the patients health and wellness today?  PATIENT UNABLE TO TALK TODAY. HE IS VERY NAUSEATED. PT ADVISED TO CALL NP WHEN HE IS ABLE.   PATIENT CALLED BACK IN PM - BETTER BUT NAUSEA IS INCAPACITATING.   Goals Addressed             This Visit's Progress    Get rid of my nausea.       Interventions Today    Flowsheet Row Most Recent Value  General Interventions   General Interventions Discussed/Reviewed General Interventions Discussed  [small bland meals, foods, push fluids if you can, see MD in the  am. Take your medications with you one of them may be the culprit for your nausea.]              SDOH assessments and interventions completed:  No   PREVIOUSLY ADDRESSED  Care Coordination Interventions:  Yes  Follow up plan: Tuesday call.  Encounter Outcome:  Pt. Visit Completed   Kevin Smith C. Myrtie Neither, MSN, [facility] Gerontological Nurse Practitioner [facility] Care Management [phone]

## 2022-04-20 NOTE — Telephone Encounter (Signed)
Called patient, no answer, left message to return call 

## 2022-04-20 NOTE — Telephone Encounter (Signed)
Have him follow up if his symptoms worsen or do not improve.

## 2022-04-21 ENCOUNTER — Encounter: Payer: Self-pay | Admitting: Family Medicine

## 2022-04-21 ENCOUNTER — Ambulatory Visit (INDEPENDENT_AMBULATORY_CARE_PROVIDER_SITE_OTHER): Payer: Medicare HMO | Admitting: Family Medicine

## 2022-04-21 VITALS — BP 115/73 | HR 71 | Temp 97.8°F | Ht 71.0 in | Wt 232.1 lb

## 2022-04-21 DIAGNOSIS — I110 Hypertensive heart disease with heart failure: Secondary | ICD-10-CM | POA: Diagnosis not present

## 2022-04-21 DIAGNOSIS — I5022 Chronic systolic (congestive) heart failure: Secondary | ICD-10-CM | POA: Diagnosis not present

## 2022-04-21 DIAGNOSIS — I4891 Unspecified atrial fibrillation: Secondary | ICD-10-CM | POA: Diagnosis not present

## 2022-04-21 DIAGNOSIS — I719 Aortic aneurysm of unspecified site, without rupture: Secondary | ICD-10-CM | POA: Diagnosis not present

## 2022-04-21 DIAGNOSIS — J441 Chronic obstructive pulmonary disease with (acute) exacerbation: Secondary | ICD-10-CM | POA: Diagnosis not present

## 2022-04-21 DIAGNOSIS — F332 Major depressive disorder, recurrent severe without psychotic features: Secondary | ICD-10-CM | POA: Diagnosis not present

## 2022-04-21 DIAGNOSIS — K219 Gastro-esophageal reflux disease without esophagitis: Secondary | ICD-10-CM

## 2022-04-21 DIAGNOSIS — F419 Anxiety disorder, unspecified: Secondary | ICD-10-CM

## 2022-04-21 DIAGNOSIS — M791 Myalgia, unspecified site: Secondary | ICD-10-CM | POA: Diagnosis not present

## 2022-04-21 DIAGNOSIS — I2510 Atherosclerotic heart disease of native coronary artery without angina pectoris: Secondary | ICD-10-CM | POA: Diagnosis not present

## 2022-04-21 DIAGNOSIS — J101 Influenza due to other identified influenza virus with other respiratory manifestations: Secondary | ICD-10-CM | POA: Diagnosis not present

## 2022-04-21 DIAGNOSIS — M1990 Unspecified osteoarthritis, unspecified site: Secondary | ICD-10-CM | POA: Diagnosis not present

## 2022-04-21 MED ORDER — DESVENLAFAXINE SUCCINATE ER 50 MG PO TB24: 50.0000 mg | ORAL_TABLET | Freq: Every day | ORAL | 1 refills | Status: DC

## 2022-04-21 MED ORDER — ONDANSETRON HCL 4 MG PO TABS: 4.0000 mg | ORAL_TABLET | Freq: Three times a day (TID) | ORAL | 0 refills | Status: DC | PRN

## 2022-04-21 NOTE — Patient Instructions (Signed)
Symptoms may be related to GERD and/or side effects of increased dosage of Pristiq. Decrease pristiq to 50 mg daily. Start famotidine twice a day. Let me know how you are feeling 1 week, sooner if you feel worse.

## 2022-04-21 NOTE — Progress Notes (Signed)
Acute Office Visit  Subjective:     Patient ID: Kevin Smith, male    DOB: Jul 20, 1955, 67 y.o.   MRN: [id number]  Chief Complaint  Patient presents with   Nausea   Anorexia    HPI Patient is in today for nausea and decreased appetite for 2-3 weeks. He reports some intermittent epigastric pain. He has had a sour taste in his mouth. He reports improvement in symptoms if he goes a long time between meals. Denies vomiting, diarrhea, or constipation. Denies fever. Hx of GERD. He is on protonix and has been compliant with this. His pristiq was increased about 10 days ago. He isn't sure if symptoms worsen with this or not. Denies vomiting, changes in bowel habits, fever, abdominal pain, or dysphagia. Reports weight loss but weight is stable per office record.   Denies SI, plan, or intent.       04/21/2022    8:24 AM 04/10/2022   10:21 AM 03/15/2022    1:11 PM  Depression screen PHQ 2/9  Decreased Interest 3 1 1  $ Down, Depressed, Hopeless 3 1 2  $ PHQ - 2 Score 6 2 3  $ Altered sleeping 3 1 2  $ Tired, decreased energy 3 1 2  $ Change in appetite 2 0 1  Feeling bad or failure about yourself  2 1 2  $ Trouble concentrating 2 0 0  Moving slowly or fidgety/restless 1 1 0  Suicidal thoughts 1 0 0  PHQ-9 Score 20 6 10  $ Difficult doing work/chores Somewhat difficult Somewhat difficult Somewhat difficult      04/21/2022    8:25 AM 04/10/2022   10:22 AM 03/15/2022    1:11 PM 01/23/2022    2:01 PM  GAD 7 : Generalized Anxiety Score  Nervous, Anxious, on Edge 3 1 2 3  $ Control/stop worrying 3 1 2 1  $ Worry too much - different things 3 1 2 1  $ Trouble relaxing 1 1 1 1  $ Restless 2 1 1 1  $ Easily annoyed or irritable 2 0 1 3  Afraid - awful might happen 3 1 1 2  $ Total GAD 7 Score 17 6 10 12  $ Anxiety Difficulty Somewhat difficult Somewhat difficult Somewhat difficult Somewhat difficult     ROS As per HPI.      Objective:    BP 115/73   Pulse 71   Temp 97.8 F (36.6 C) (Temporal)   Ht 5' 11"$   (1.803 m)   Wt 232 lb 2 oz (105.3 kg)   SpO2 95%   BMI 32.37 kg/m  Wt Readings from Last 3 Encounters:  04/21/22 232 lb 2 oz (105.3 kg)  04/10/22 233 lb (105.7 kg)  03/31/22 235 lb (106.6 kg)      Physical Exam Vitals and nursing note reviewed.  Constitutional:      General: He is not in acute distress.    Appearance: Normal appearance. He is not ill-appearing, toxic-appearing or diaphoretic.  HENT:     Mouth/Throat:     Mouth: Mucous membranes are moist.     Pharynx: Oropharynx is clear.  Cardiovascular:     Rate and Rhythm: Normal rate and regular rhythm.     Heart sounds: Normal heart sounds. No murmur heard. Pulmonary:     Effort: Pulmonary effort is normal.     Breath sounds: Normal breath sounds.  Abdominal:     General: Bowel sounds are normal. There is no distension.     Palpations: Abdomen is soft.     Tenderness: There  is abdominal tenderness in the epigastric area. There is no guarding or rebound. Negative signs include Murphy's sign and McBurney's sign.  Musculoskeletal:     Cervical back: Neck supple. No rigidity.     Right lower leg: No edema.     Left lower leg: No edema.  Skin:    General: Skin is warm and dry.     Coloration: Skin is not jaundiced.  Neurological:     General: No focal deficit present.     Mental Status: He is alert and oriented to person, place, and time.  Psychiatric:        Mood and Affect: Mood normal.        Behavior: Behavior normal.     No results found for any visits on 04/21/22.      Assessment & Plan:   Kevin Smith was seen today for nausea and anorexia.  Diagnoses and all orders for this visit:  Gastroesophageal reflux disease without esophagitis -     ondansetron (ZOFRAN) 4 MG tablet; Take 1 tablet (4 mg total) by mouth every 8 (eight) hours as needed for nausea or vomiting.  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  Suspect nausea is due to uncontrolled GERD. Continue protonix, add pepcid BID prn. Zofran prn. Discussed could also be side effects from increased dosage of pristiq. Will decrease dosage back to 50 mg daily of pristiq since his GAD/PHQ scores increased. Denies SI, plan, or intent. He will let me know how he is feeling in 1 weeks, sooner for new or worsening symptoms.   Return if symptoms worsen or fail to improve.  The patient indicates understanding of these issues and agrees with the plan.  [name], FNP

## 2022-04-25 ENCOUNTER — Encounter: Payer: Self-pay | Admitting: *Deleted

## 2022-04-25 ENCOUNTER — Telehealth: Payer: Self-pay | Admitting: *Deleted

## 2022-04-25 DIAGNOSIS — M791 Myalgia, unspecified site: Secondary | ICD-10-CM | POA: Diagnosis not present

## 2022-04-25 DIAGNOSIS — I2510 Atherosclerotic heart disease of native coronary artery without angina pectoris: Secondary | ICD-10-CM | POA: Diagnosis not present

## 2022-04-25 DIAGNOSIS — J101 Influenza due to other identified influenza virus with other respiratory manifestations: Secondary | ICD-10-CM | POA: Diagnosis not present

## 2022-04-25 DIAGNOSIS — I719 Aortic aneurysm of unspecified site, without rupture: Secondary | ICD-10-CM | POA: Diagnosis not present

## 2022-04-25 DIAGNOSIS — I5022 Chronic systolic (congestive) heart failure: Secondary | ICD-10-CM | POA: Diagnosis not present

## 2022-04-25 DIAGNOSIS — M1990 Unspecified osteoarthritis, unspecified site: Secondary | ICD-10-CM | POA: Diagnosis not present

## 2022-04-25 DIAGNOSIS — I110 Hypertensive heart disease with heart failure: Secondary | ICD-10-CM | POA: Diagnosis not present

## 2022-04-25 DIAGNOSIS — J441 Chronic obstructive pulmonary disease with (acute) exacerbation: Secondary | ICD-10-CM | POA: Diagnosis not present

## 2022-04-25 DIAGNOSIS — I4891 Unspecified atrial fibrillation: Secondary | ICD-10-CM | POA: Diagnosis not present

## 2022-04-25 NOTE — Telephone Encounter (Signed)
Patient notified and verbalized understanding. Patient had no questions or concerns at this time.  

## 2022-04-25 NOTE — Telephone Encounter (Signed)
Left a message for patient to call office back for medication changes.

## 2022-04-25 NOTE — Patient Outreach (Signed)
  Care Coordination   Follow Up Visit Note   04/25/2022 Name: Kyley Laurel MRN: [id number] DOB: 1955/12/05  Terance Pomplun is a 67 y.o. year old male who sees [name], FNP for primary care. I spoke with  [name] by phone today.  What matters to the patients health and wellness today?  Resolve one problem after another. His eye pain is totally gone now. Needs to attend his follow up which was encouraged. Nausea has resolved. Still having episodes of hypotension. Sprironolactone being held.    Goals Addressed             This Visit's Progress    COMPLETED: Get rid of my nausea.   On track    Interventions Today    Flowsheet Row Most Recent Value  General Interventions   General Interventions Discussed/Reviewed General Interventions Discussed  [Regarding MD OV and following results: Gave ondosteron and pt has not been nauseated since. Also decreased pristiq and increased his Pepcid AC to bid.]  Education Interventions   Provided Verbal Education On Nutrition, When to see the doctor  [Reviewed diet to avoid GERD. Pt is now using a broiler and eating more fruits and vegegables. Avoid fried food and gas producing foods. Encouraged him to stay upright for 4 hours before lying down to sleep.]  Mental Health Interventions   Mental Health Discussed/Reviewed Mental Health Discussed  [Please follow up with your provider to talk about how your mental status is doing. Talk or Call someone if you are feeling more down than usual or thinking suicidal thoughts. Tell someone!]           I want to feel better emotionally, less depressed by the end of 30 days.       Interventions Today    Flowsheet Row Most Recent Value  General Interventions   General Interventions Discussed/Reviewed General Interventions Discussed  [Regarding MD OV and following results: Gave ondosteron and pt has not been nauseated since. Also decreased pristiq and increased his Pepcid AC to bid.]  Education  Interventions   Provided Verbal Education On Nutrition, When to see the doctor  [Reviewed diet to avoid GERD. Pt is now using a broiler and eating more fruits and vegegables. Avoid fried food and gas producing foods. Encouraged him to stay upright for 4 hours before lying down to sleep.]  Mental Health Interventions   Mental Health Discussed/Reviewed Mental Health Discussed  [Please follow up with your provider to talk about how your mental status is doing. Talk or Call someone if you are feeling more down than usual or thinking suicidal thoughts. Tell someone!]                    SDOH assessments and interventions completed:  Yes     Care Coordination Interventions:  Yes, provided   Follow up plan: Follow up call scheduled for 2 Weeks by Assigned care manager.    Encounter Outcome:  Pt. Visit Completed    Kayleen Memos C. Myrtie Neither, MSN, GNP-BC Gerontological Nurse Practitioner [facility] Care Management [phone]  .ccs

## 2022-04-28 ENCOUNTER — Ambulatory Visit: Payer: Self-pay

## 2022-04-28 ENCOUNTER — Telehealth: Payer: Self-pay

## 2022-04-28 NOTE — Patient Instructions (Signed)
Visit Information  Thank you for taking time to visit with me today. Please don't hesitate to contact me if I can be of assistance to you.   Following are the goals we discussed today:   Goals Addressed             This Visit's Progress    COMPLETED: Care Coordination Activities - [facility] Transportation       Care Coordination Interventions: Confirmed receipt of educational materials on county transportation resources - no questions at this time SDoH screening performed - patient reports he is no longer experiencing food insecurity since initiating [place] delivery. Patient reports he experienced difficulty affording food due to having to pay someone to transport him to the store. However, now that he is accessing grocery delivery services he is able to afford groceries Determined patient is experiencing financial strain due to the cost of medications Determined the patient is over the income limit for Medicaid Performed chart review to note recent referral to pharmacy team; patient has an appointment scheduled to speak with PharmD on 2/29 at 1:00 pm Reminded patient of scheduled appointment advising the pharmacist will be able to discuss medications and assist with patient assistance application if applicable Discussed patient is experiencing ongoing heartburn which has made it difficult to drink water at times Patient reports he has been seen by his primary care provider for this concern and has recently been advised to change his medication regimen for better relief Performed chart review to note patient is scheduled to be seen by his primary care provider on 2/23 at 4:15 pm Reminded the patient of this future appointments advising him to review ongoing heartburn symptoms with provider Assessed for ongoing SW care coordination needs; patient denies SW needs. Patient is aware of future call with RN Care Manager on 05/01/22        If you are experiencing a Mental Health or  [place] or need someone to talk to, please call the [facility]: [phone] call [phone]  The patient verbalized understanding of instructions, educational materials, and care plan provided today and DECLINED offer to receive copy of patient instructions, educational materials, and care plan.   No further follow up required: Please contact your primary care provider as needed.  Daneen Schick, BSW, CDP Social Worker, Certified Dementia Practitioner [place] [facility]  Care Coordination [phone]

## 2022-04-28 NOTE — Patient Outreach (Signed)
  Care Coordination   04/28/2022 Name: Kevin Smith MRN: [id number] DOB: 1955/12/27   Care Coordination Outreach Attempts:  An unsuccessful telephone outreach was attempted for a scheduled appointment today.  Follow Up Plan:  Additional outreach attempts will be made to offer the patient care coordination information and services.   Encounter Outcome:  No Answer   Care Coordination Interventions:  No, not indicated    Daneen Schick, BSW, CDP Social Worker, Certified Dementia Practitioner [place] [facility]  Care Coordination [phone]

## 2022-04-28 NOTE — Patient Outreach (Signed)
  Care Coordination   Follow Up Visit Note   04/28/2022 Name: Kevin Smith MRN: [id number] DOB: 06-15-1955  Kevin Smith is a 67 y.o. year old male who sees [name], FNP for primary care. I spoke with  [name] by phone today.  What matters to the patients health and wellness today?  "I just want to feel better"    Goals Addressed             This Visit's Progress    COMPLETED: Care Coordination Activities - [facility] Transportation       Care Coordination Interventions: Confirmed receipt of educational materials on county transportation resources - no questions at this time SDoH screening performed - patient reports he is no longer experiencing food insecurity since initiating [place] delivery. Patient reports he experienced difficulty affording food due to having to pay someone to transport him to the store. However, now that he is accessing grocery delivery services he is able to afford groceries Determined patient is experiencing financial strain due to the cost of medications Determined the patient is over the income limit for Medicaid Performed chart review to note recent referral to pharmacy team; patient has an appointment scheduled to speak with PharmD on 2/29 at 1:00 pm Reminded patient of scheduled appointment advising the pharmacist will be able to discuss medications and assist with patient assistance application if applicable Discussed patient is experiencing ongoing heartburn which has made it difficult to drink water at times Patient reports he has been seen by his primary care provider for this concern and has recently been advised to change his medication regimen for better relief Performed chart review to note patient is scheduled to be seen by his primary care provider on 2/23 at 4:15 pm Reminded the patient of this future appointments advising him to review ongoing heartburn symptoms with provider Assessed for ongoing SW care coordination needs;  patient denies SW needs. Patient is aware of future call with RN Care Manager on 05/01/22        SDOH assessments and interventions completed:  Yes  SDOH Interventions Today    Flowsheet Row Most Recent Value  SDOH Interventions   Food Insecurity Interventions Other (Comment)  [pt had diff with getting to store but now using [employer] delivery and denies food insecurity]  Housing Interventions Intervention Not Indicated  Transportation Interventions Other (Comment)  [pt had diff with transportation - education on health plan benefit and county transportation provided]  Utilities Interventions Intervention Not Indicated  Financial Strain Interventions Other (Comment)  [pt reports diff with med costs,  reminded of future appt with PharmD 2/29]        Care Coordination Interventions:  Yes, provided   Interventions Today    Flowsheet Row Most Recent Value  Chronic Disease   Chronic disease during today's visit Hypertension (HTN), Chronic Obstructive Pulmonary Disease (COPD), Other  [Gastroesophageal reflux disease]  General Interventions   General Interventions Discussed/Reviewed General Interventions Reviewed, [employer], Doctor Visits  Doctor Visits Discussed/Reviewed Doctor Visits Reviewed  Education Interventions   Education Provided Provided Education  Provided Verbal Education On Personal assistant, Community Resources       Follow up plan: No further intervention required. Patient will remain engaged with RN Care Manager to assist with ongoing care coordination needs.    Encounter Outcome:  Pt. Visit Completed   Daneen Schick, BSW, CDP Social Worker, Certified Dementia Practitioner [place] [facility]  Care Coordination [phone]

## 2022-05-01 ENCOUNTER — Telehealth: Payer: Self-pay | Admitting: Family Medicine

## 2022-05-01 ENCOUNTER — Ambulatory Visit: Payer: Self-pay | Admitting: *Deleted

## 2022-05-01 ENCOUNTER — Other Ambulatory Visit: Payer: Self-pay | Admitting: Family Medicine

## 2022-05-01 DIAGNOSIS — I2510 Atherosclerotic heart disease of native coronary artery without angina pectoris: Secondary | ICD-10-CM | POA: Diagnosis not present

## 2022-05-01 DIAGNOSIS — I4891 Unspecified atrial fibrillation: Secondary | ICD-10-CM | POA: Diagnosis not present

## 2022-05-01 DIAGNOSIS — J441 Chronic obstructive pulmonary disease with (acute) exacerbation: Secondary | ICD-10-CM | POA: Diagnosis not present

## 2022-05-01 DIAGNOSIS — I5022 Chronic systolic (congestive) heart failure: Secondary | ICD-10-CM | POA: Diagnosis not present

## 2022-05-01 DIAGNOSIS — I719 Aortic aneurysm of unspecified site, without rupture: Secondary | ICD-10-CM | POA: Diagnosis not present

## 2022-05-01 DIAGNOSIS — J101 Influenza due to other identified influenza virus with other respiratory manifestations: Secondary | ICD-10-CM | POA: Diagnosis not present

## 2022-05-01 DIAGNOSIS — M791 Myalgia, unspecified site: Secondary | ICD-10-CM | POA: Diagnosis not present

## 2022-05-01 DIAGNOSIS — I110 Hypertensive heart disease with heart failure: Secondary | ICD-10-CM | POA: Diagnosis not present

## 2022-05-01 DIAGNOSIS — M1990 Unspecified osteoarthritis, unspecified site: Secondary | ICD-10-CM | POA: Diagnosis not present

## 2022-05-01 NOTE — Telephone Encounter (Signed)
Noted. Please see previous telephone encounter.

## 2022-05-01 NOTE — Patient Outreach (Signed)
  Care Coordination   05/01/2022 Name: Kevin Smith MRN: [id number] DOB: 1955-09-21   Care Coordination Outreach Attempts:  An unsuccessful telephone outreach was attempted today to offer the patient information about available care coordination services as a benefit of their health plan.   Follow Up Plan:  Additional outreach attempts will be made to offer the patient care coordination information and services.   Encounter Outcome:  No Answer   Care Coordination Interventions:  No, not indicated    Hieu Herms L. Lavina Hamman, RN, BSN, [place] Coordinator Office number [phone]

## 2022-05-01 NOTE — Telephone Encounter (Addendum)
Patient feels that his HR was elevated and when they checked HR it was 69 and WNL.  She states patient was not in any distress.  Patient states that he feels the antiviral is causing him to be nauseated and that the famotidine was making his acid reflux was getting worse.  He is also complaining with bilateral hand and ankle pain   Patient states he has an appointment on Friday and did not want to be seen sooner.

## 2022-05-01 NOTE — Telephone Encounter (Signed)
Calling to get ok from PCP to discharge

## 2022-05-01 NOTE — Telephone Encounter (Signed)
Ok to d/c PT. Recommend evaluation sooner than Friday. What antiviral is he  on?

## 2022-05-01 NOTE — Telephone Encounter (Signed)
Pt aware of provider feedback and voiced understanding and offered appt tomorrow but pt declined and wanted to wait till Friday.

## 2022-05-01 NOTE — Telephone Encounter (Signed)
Request to d/c from PT Heat beating fast pule 69 WNL but weak Antivirial med causing nauseous, decreased appetite Famotidine making acid reflux worse

## 2022-05-02 DIAGNOSIS — I2510 Atherosclerotic heart disease of native coronary artery without angina pectoris: Secondary | ICD-10-CM | POA: Diagnosis not present

## 2022-05-02 DIAGNOSIS — I5022 Chronic systolic (congestive) heart failure: Secondary | ICD-10-CM | POA: Diagnosis not present

## 2022-05-02 DIAGNOSIS — M1990 Unspecified osteoarthritis, unspecified site: Secondary | ICD-10-CM | POA: Diagnosis not present

## 2022-05-02 DIAGNOSIS — J441 Chronic obstructive pulmonary disease with (acute) exacerbation: Secondary | ICD-10-CM | POA: Diagnosis not present

## 2022-05-02 DIAGNOSIS — I4891 Unspecified atrial fibrillation: Secondary | ICD-10-CM | POA: Diagnosis not present

## 2022-05-02 DIAGNOSIS — I719 Aortic aneurysm of unspecified site, without rupture: Secondary | ICD-10-CM | POA: Diagnosis not present

## 2022-05-02 DIAGNOSIS — J101 Influenza due to other identified influenza virus with other respiratory manifestations: Secondary | ICD-10-CM | POA: Diagnosis not present

## 2022-05-02 DIAGNOSIS — I110 Hypertensive heart disease with heart failure: Secondary | ICD-10-CM | POA: Diagnosis not present

## 2022-05-02 DIAGNOSIS — M791 Myalgia, unspecified site: Secondary | ICD-10-CM | POA: Diagnosis not present

## 2022-05-02 NOTE — Telephone Encounter (Signed)
Tabitha at patient's home. She wants to report that pt is feeling more depressed and nauseous. She is trying to fixing medicine to help with the nauseous.

## 2022-05-02 NOTE — Telephone Encounter (Signed)
Attempted to contact - NA  Who is tabitha?

## 2022-05-03 ENCOUNTER — Ambulatory Visit (INDEPENDENT_AMBULATORY_CARE_PROVIDER_SITE_OTHER): Payer: Medicare HMO

## 2022-05-03 DIAGNOSIS — E785 Hyperlipidemia, unspecified: Secondary | ICD-10-CM

## 2022-05-03 DIAGNOSIS — I110 Hypertensive heart disease with heart failure: Secondary | ICD-10-CM | POA: Diagnosis not present

## 2022-05-03 DIAGNOSIS — G2581 Restless legs syndrome: Secondary | ICD-10-CM

## 2022-05-03 DIAGNOSIS — F32A Depression, unspecified: Secondary | ICD-10-CM

## 2022-05-03 DIAGNOSIS — I2510 Atherosclerotic heart disease of native coronary artery without angina pectoris: Secondary | ICD-10-CM

## 2022-05-03 DIAGNOSIS — J441 Chronic obstructive pulmonary disease with (acute) exacerbation: Secondary | ICD-10-CM

## 2022-05-03 DIAGNOSIS — J101 Influenza due to other identified influenza virus with other respiratory manifestations: Secondary | ICD-10-CM | POA: Diagnosis not present

## 2022-05-03 DIAGNOSIS — F419 Anxiety disorder, unspecified: Secondary | ICD-10-CM

## 2022-05-03 DIAGNOSIS — F17210 Nicotine dependence, cigarettes, uncomplicated: Secondary | ICD-10-CM

## 2022-05-03 DIAGNOSIS — I4891 Unspecified atrial fibrillation: Secondary | ICD-10-CM

## 2022-05-03 DIAGNOSIS — M1990 Unspecified osteoarthritis, unspecified site: Secondary | ICD-10-CM | POA: Diagnosis not present

## 2022-05-03 DIAGNOSIS — I252 Old myocardial infarction: Secondary | ICD-10-CM

## 2022-05-03 DIAGNOSIS — G4700 Insomnia, unspecified: Secondary | ICD-10-CM

## 2022-05-03 DIAGNOSIS — M791 Myalgia, unspecified site: Secondary | ICD-10-CM | POA: Diagnosis not present

## 2022-05-03 DIAGNOSIS — I719 Aortic aneurysm of unspecified site, without rupture: Secondary | ICD-10-CM

## 2022-05-03 DIAGNOSIS — K219 Gastro-esophageal reflux disease without esophagitis: Secondary | ICD-10-CM

## 2022-05-03 DIAGNOSIS — I5022 Chronic systolic (congestive) heart failure: Secondary | ICD-10-CM

## 2022-05-03 DIAGNOSIS — G43909 Migraine, unspecified, not intractable, without status migrainosus: Secondary | ICD-10-CM

## 2022-05-03 DIAGNOSIS — F1010 Alcohol abuse, uncomplicated: Secondary | ICD-10-CM

## 2022-05-05 ENCOUNTER — Ambulatory Visit (INDEPENDENT_AMBULATORY_CARE_PROVIDER_SITE_OTHER): Payer: Medicare HMO | Admitting: Family Medicine

## 2022-05-05 ENCOUNTER — Encounter: Payer: Self-pay | Admitting: Family Medicine

## 2022-05-05 VITALS — BP 129/86 | HR 79 | Temp 97.5°F | Resp 20 | Ht 71.0 in | Wt 232.0 lb

## 2022-05-05 DIAGNOSIS — R1319 Other dysphagia: Secondary | ICD-10-CM

## 2022-05-05 DIAGNOSIS — F332 Major depressive disorder, recurrent severe without psychotic features: Secondary | ICD-10-CM | POA: Diagnosis not present

## 2022-05-05 DIAGNOSIS — F411 Generalized anxiety disorder: Secondary | ICD-10-CM | POA: Diagnosis not present

## 2022-05-05 DIAGNOSIS — K219 Gastro-esophageal reflux disease without esophagitis: Secondary | ICD-10-CM | POA: Diagnosis not present

## 2022-05-05 DIAGNOSIS — F419 Anxiety disorder, unspecified: Secondary | ICD-10-CM

## 2022-05-05 MED ORDER — DESVENLAFAXINE SUCCINATE ER 100 MG PO TB24: 100.0000 mg | ORAL_TABLET | Freq: Every day | ORAL | 3 refills | Status: DC

## 2022-05-05 MED ORDER — HYDROXYZINE HCL 10 MG PO TABS: 10.0000 mg | ORAL_TABLET | Freq: Three times a day (TID) | ORAL | 1 refills | Status: DC | PRN

## 2022-05-05 MED ORDER — PANTOPRAZOLE SODIUM 40 MG PO TBEC: 40.0000 mg | DELAYED_RELEASE_TABLET | Freq: Two times a day (BID) | ORAL | 0 refills | Status: DC
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
## 2022-05-08 ENCOUNTER — Encounter: Payer: Self-pay | Admitting: Family Medicine

## 2022-05-08 ENCOUNTER — Encounter: Payer: Self-pay | Admitting: Gastroenterology

## 2022-05-10 ENCOUNTER — Ambulatory Visit: Payer: Medicare HMO | Admitting: Internal Medicine

## 2022-05-10 DIAGNOSIS — I4891 Unspecified atrial fibrillation: Secondary | ICD-10-CM | POA: Diagnosis not present

## 2022-05-10 DIAGNOSIS — J441 Chronic obstructive pulmonary disease with (acute) exacerbation: Secondary | ICD-10-CM | POA: Diagnosis not present

## 2022-05-10 DIAGNOSIS — I110 Hypertensive heart disease with heart failure: Secondary | ICD-10-CM | POA: Diagnosis not present

## 2022-05-10 DIAGNOSIS — I5022 Chronic systolic (congestive) heart failure: Secondary | ICD-10-CM | POA: Diagnosis not present

## 2022-05-10 DIAGNOSIS — I719 Aortic aneurysm of unspecified site, without rupture: Secondary | ICD-10-CM | POA: Diagnosis not present

## 2022-05-10 DIAGNOSIS — J101 Influenza due to other identified influenza virus with other respiratory manifestations: Secondary | ICD-10-CM | POA: Diagnosis not present

## 2022-05-10 DIAGNOSIS — M1990 Unspecified osteoarthritis, unspecified site: Secondary | ICD-10-CM | POA: Diagnosis not present

## 2022-05-10 DIAGNOSIS — I2510 Atherosclerotic heart disease of native coronary artery without angina pectoris: Secondary | ICD-10-CM | POA: Diagnosis not present

## 2022-05-10 DIAGNOSIS — M791 Myalgia, unspecified site: Secondary | ICD-10-CM | POA: Diagnosis not present

## 2022-05-11 ENCOUNTER — Ambulatory Visit (INDEPENDENT_AMBULATORY_CARE_PROVIDER_SITE_OTHER): Payer: Medicare HMO | Admitting: Pharmacist

## 2022-05-11 ENCOUNTER — Telehealth: Payer: Self-pay | Admitting: Family Medicine

## 2022-05-11 ENCOUNTER — Telehealth: Payer: Self-pay | Admitting: Pharmacist

## 2022-05-11 DIAGNOSIS — M791 Myalgia, unspecified site: Secondary | ICD-10-CM | POA: Diagnosis not present

## 2022-05-11 DIAGNOSIS — J101 Influenza due to other identified influenza virus with other respiratory manifestations: Secondary | ICD-10-CM | POA: Diagnosis not present

## 2022-05-11 DIAGNOSIS — J449 Chronic obstructive pulmonary disease, unspecified: Secondary | ICD-10-CM

## 2022-05-11 DIAGNOSIS — I5022 Chronic systolic (congestive) heart failure: Secondary | ICD-10-CM | POA: Diagnosis not present

## 2022-05-11 DIAGNOSIS — I110 Hypertensive heart disease with heart failure: Secondary | ICD-10-CM | POA: Diagnosis not present

## 2022-05-11 DIAGNOSIS — J439 Emphysema, unspecified: Secondary | ICD-10-CM

## 2022-05-11 DIAGNOSIS — M1990 Unspecified osteoarthritis, unspecified site: Secondary | ICD-10-CM | POA: Diagnosis not present

## 2022-05-11 DIAGNOSIS — I4891 Unspecified atrial fibrillation: Secondary | ICD-10-CM | POA: Diagnosis not present

## 2022-05-11 DIAGNOSIS — J441 Chronic obstructive pulmonary disease with (acute) exacerbation: Secondary | ICD-10-CM | POA: Diagnosis not present

## 2022-05-11 DIAGNOSIS — I719 Aortic aneurysm of unspecified site, without rupture: Secondary | ICD-10-CM | POA: Diagnosis not present

## 2022-05-11 DIAGNOSIS — F411 Generalized anxiety disorder: Secondary | ICD-10-CM

## 2022-05-11 DIAGNOSIS — I2510 Atherosclerotic heart disease of native coronary artery without angina pectoris: Secondary | ICD-10-CM | POA: Diagnosis not present

## 2022-05-11 MED ORDER — BREZTRI AEROSPHERE 160-9-4.8 MCG/ACT IN AERO: 2.0000 | INHALATION_SPRAY | Freq: Two times a day (BID) | RESPIRATORY_TRACT | 5 refills | Status: DC

## 2022-05-11 NOTE — Telephone Encounter (Signed)
Can you please enroll patient for breztri/az&me if not already enrolled for 2024. Escribed new rx to [employer] for LIS via web with pharmd today

## 2022-05-11 NOTE — Telephone Encounter (Signed)
FYI

## 2022-05-15 NOTE — Telephone Encounter (Signed)
Received notification from AZ[employer]ME regarding approval for [place]. Patient assistance approved from 12/2021 to 03/13/23.  Thanks for sending rx!

## 2022-05-16 ENCOUNTER — Telehealth: Payer: Self-pay | Admitting: Pharmacist

## 2022-05-16 NOTE — Telephone Encounter (Signed)
    05/16/2022 Name: Kevin Smith MRN: [id number] DOB: 03-Mar-1956   Drug information request search: Amiodarone & potentially adding hydroxyzine PRN for anxiety  Concurrent use of AMIODARONE and QT PROLONGING AGENTS may result in an increased risk of QT interval prolongation.  Clinical Management: Avoid the concomitant administration of amiodarone and a QT prolonging agent since it may result in additive effects on the QT interval and increase the risk of torsades de pointes. Due to the long half-life of amiodarone, this interaction is possible even after the discontinuation of amiodarone (Prod Info Cordarone oral tablets, 2016).  Onset: Not Specified Severity: MAJOR May consider buspar for PRN anxiety instead of hydroxyzine in this scenario.  Consider psych consult given extensive patient history of trials/fails of anti-depressants/anxiety medications.   Regina Eck, PharmD, [place] Clinical Pharmacist, [facility]

## 2022-05-17 MED ORDER — BUSPIRONE HCL 5 MG PO TABS: 5.0000 mg | ORAL_TABLET | Freq: Two times a day (BID) | ORAL | 0 refills | Status: DC

## 2022-05-17 NOTE — Telephone Encounter (Signed)
There is potential interaction between hydroxyzine and amiodarone. Discontinue hydroxyzine. I have sent in buspar for him to take BID instead. I also recommend a referral to psychiatry. Is he agreeable?

## 2022-05-17 NOTE — Addendum Note (Signed)
Addended by: [name] on: 05/17/2022 07:59 AM   Modules accepted: Orders

## 2022-05-19 NOTE — Progress Notes (Signed)
.    Pharmacy Note   05/11/2022 Name:  Kevin Smith           MRN:  [id number]      DOB:  01/02/1956   Summary:   Chronic Obstructive Pulmonary Disease:  Goal on Track (progressing): YES. Controlled; current treatment: Breztri ; Ventolin rescue, DuoNebs PRN Reports breztri samples are helping Only using rescue albuterol (ventolin) 1-2 times per day Smoking 1 ppd; declines cessation although offered GOLD Classification:  NOT STAGED; doesn't follow with pulm Most recent Pulmonary Function Testing:  n/a 1 exacerbations requiring treatment in the last 6 months  Assessed patient finances. [place] [employer]; 4 weeks worth of samples given;   Hypertension: -medications: LOSARTAN, METOP -at goal <140/80 -smoking cessation would aid in improved hypertension control & cardiac history    HFrEF (heart failure with reduced ejection fraction)  Managed by [facility]. On plavix, valsartan, jardiance, toprolol, and spirolactone. -APPLICATION SUBMITTED TO [employer] grant for Jardiance assistance    ASSISTED PATIENT WITH APPLICATION FOR EXTRA HELP/LOW INCOME SUBSIDY THROUGH MEDICARE.  PATIENT TO BE ALERTED VIA MAIL OR PHONE RE: APPROVAL IN 2-3 WEEKS. REQUESTED HE CALL ME WITH OUTCOME     Follow Up Plan: Telephone follow up appointment with care management team member scheduled for: 2 months   Regina Eck, PharmD, [facility], [place]

## 2022-05-22 ENCOUNTER — Ambulatory Visit: Payer: Medicare HMO | Admitting: Family Medicine

## 2022-05-22 NOTE — Telephone Encounter (Signed)
Encounter closed, patient was seen in office on 05/05/22

## 2022-05-23 ENCOUNTER — Ambulatory Visit: Payer: Medicare HMO | Admitting: Gastroenterology

## 2022-05-24 ENCOUNTER — Encounter: Payer: Self-pay | Admitting: Family Medicine

## 2022-05-24 ENCOUNTER — Ambulatory Visit (INDEPENDENT_AMBULATORY_CARE_PROVIDER_SITE_OTHER): Payer: Medicare HMO | Admitting: Family Medicine

## 2022-05-24 VITALS — BP 103/68 | HR 82 | Temp 97.6°F | Ht 71.0 in | Wt 233.1 lb

## 2022-05-24 DIAGNOSIS — K219 Gastro-esophageal reflux disease without esophagitis: Secondary | ICD-10-CM

## 2022-05-24 DIAGNOSIS — F411 Generalized anxiety disorder: Secondary | ICD-10-CM | POA: Diagnosis not present

## 2022-05-24 DIAGNOSIS — F332 Major depressive disorder, recurrent severe without psychotic features: Secondary | ICD-10-CM | POA: Diagnosis not present

## 2022-05-24 DIAGNOSIS — I10 Essential (primary) hypertension: Secondary | ICD-10-CM

## 2022-05-24 MED ORDER — BUSPIRONE HCL 10 MG PO TABS: 10.0000 mg | ORAL_TABLET | Freq: Two times a day (BID) | ORAL | 1 refills | Status: DC
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
## 2022-05-24 NOTE — Progress Notes (Deleted)
   Established Patient Office Visit  Subjective   Patient ID: Kevin Smith, male    DOB: 07-21-1955  Age: 67 y.o. MRN: [id number]  No chief complaint on file.   HPI   120s/80s at home     05/24/2022    4:11 PM 05/05/2022    4:49 PM 05/05/2022    4:16 PM  Depression screen PHQ 2/9  Decreased Interest 1 3 3   Down, Depressed, Hopeless 1 3 3   PHQ - 2 Score 2 6 6   Altered sleeping 3 3 3   Tired, decreased energy 0 3 3  Change in appetite 2 3 3   Feeling bad or failure about yourself  3 3 3   Trouble concentrating 2 1 1   Moving slowly or fidgety/restless 1 1 1   Suicidal thoughts 0 2 2  PHQ-9 Score 13 22 22   Difficult doing work/chores Somewhat difficult Somewhat difficult Somewhat difficult      05/24/2022    4:12 PM 05/05/2022    4:49 PM 05/05/2022    4:16 PM 04/21/2022    8:25 AM  GAD 7 : Generalized Anxiety Score  Nervous, Anxious, on Edge 3 2 2 3   Control/stop worrying 3 2 2 3   Worry too much - different things 3 3 3 3   Trouble relaxing 1 2 2 1   Restless 1 2 2 2   Easily annoyed or irritable 1 2 2 2   Afraid - awful might happen 3 3 3 3   Total GAD 7 Score 15 16 16 17   Anxiety Difficulty Very difficult Somewhat difficult Somewhat difficult Somewhat difficult     {History (Optional):23778}  ROS    Objective:     BP 103/68   Pulse 82   Temp 97.6 F (36.4 C) (Temporal)   Ht 5\' 11"  (1.803 m)   Wt 233 lb 2 oz (105.7 kg)   SpO2 95%   BMI 32.51 kg/m  BP Readings from Last 3 Encounters:  05/24/22 103/68  05/05/22 129/86  04/21/22 115/73   Wt Readings from Last 3 Encounters:  05/24/22 233 lb 2 oz (105.7 kg)  05/05/22 232 lb (105.2 kg)  04/21/22 232 lb 2 oz (105.3 kg)      Physical Exam   No results found for any visits on 05/24/22.  {Labs (Optional):23779}  The ASCVD Risk score (Arnett DK, et al., 2019) failed to calculate for the following reasons:   The patient has a prior MI or stroke diagnosis    Assessment & Plan:   Problem List Items Addressed  This Visit   None   No follow-ups on file.    [name], FNP

## 2022-05-29 NOTE — Telephone Encounter (Signed)
Pt was seen in office 05/24/22, will close encounter.

## 2022-06-02 ENCOUNTER — Ambulatory Visit (INDEPENDENT_AMBULATORY_CARE_PROVIDER_SITE_OTHER): Payer: Medicare HMO | Admitting: Family Medicine

## 2022-06-02 ENCOUNTER — Ambulatory Visit (INDEPENDENT_AMBULATORY_CARE_PROVIDER_SITE_OTHER): Payer: Medicare HMO

## 2022-06-02 ENCOUNTER — Ambulatory Visit: Payer: Medicare HMO | Admitting: Internal Medicine

## 2022-06-02 ENCOUNTER — Encounter: Payer: Self-pay | Admitting: Family Medicine

## 2022-06-02 ENCOUNTER — Encounter: Payer: Self-pay | Admitting: *Deleted

## 2022-06-02 VITALS — BP 116/74 | HR 74 | Temp 97.5°F | Ht 71.0 in | Wt 239.1 lb

## 2022-06-02 DIAGNOSIS — K5900 Constipation, unspecified: Secondary | ICD-10-CM

## 2022-06-02 DIAGNOSIS — R1084 Generalized abdominal pain: Secondary | ICD-10-CM | POA: Diagnosis not present

## 2022-06-02 DIAGNOSIS — R109 Unspecified abdominal pain: Secondary | ICD-10-CM | POA: Diagnosis not present

## 2022-06-02 DIAGNOSIS — K219 Gastro-esophageal reflux disease without esophagitis: Secondary | ICD-10-CM

## 2022-06-02 DIAGNOSIS — R110 Nausea: Secondary | ICD-10-CM

## 2022-06-02 MED ORDER — PROMETHAZINE HCL 25 MG/ML IJ SOLN
25.0000 mg | Freq: Once | INTRAMUSCULAR | Status: AC
  Administered 2022-06-02: 25 mg via INTRAMUSCULAR

## 2022-06-02 NOTE — Progress Notes (Signed)
Acute Office Visit  Subjective:     Patient ID: Kevin Smith, male    DOB: [birth year], 67 y.o.   MRN: [id number]  Chief Complaint  Patient presents with   Nausea    Abdominal Pain This is a recurrent problem. The current episode started more than 1 month ago. The onset quality is gradual. The problem occurs constantly. The problem has been gradually worsening. The pain is located in the LLQ and RLQ. The quality of the pain is burning. The abdominal pain does not radiate. Associated symptoms include anorexia, belching, constipation (only had 1 small BM in last week. Reprots chronic constipation), flatus, nausea and vomiting (a few episodes of NBNB, also has dry heaving). Pertinent negatives include no diarrhea, dysuria, fever, frequency, headaches, hematochezia, hematuria, melena or weight loss. The pain is aggravated by drinking alcohol (carbinated drinks). The pain is relieved by Recumbency. He has tried antacids, H2 blockers and proton pump inhibitors for the symptoms. The treatment provided moderate (zofran) relief. His past medical history is significant for GERD. There is no history of colon cancer, Crohn's disease, gallstones, irritable bowel syndrome, pancreatitis, PUD or ulcerative colitis.     Review of Systems  Constitutional:  Negative for fever and weight loss.  Gastrointestinal:  Positive for abdominal pain, anorexia, constipation (only had 1 small BM in last week. Reprots chronic constipation), flatus, nausea and vomiting (a few episodes of NBNB, also has dry heaving). Negative for diarrhea, hematochezia and melena.  Genitourinary:  Negative for dysuria, frequency and hematuria.  Neurological:  Negative for headaches.        Objective:    BP 116/74   Pulse 74   Temp (!) 97.5 F (36.4 C) (Temporal)   Ht 5\' 11"  (1.803 m)   Wt 239 lb 2 oz (108.5 kg)   SpO2 92%   BMI 33.35 kg/m    Physical Exam Vitals and nursing note reviewed.  Constitutional:      General: He  is not in acute distress.    Appearance: He is not ill-appearing, toxic-appearing or diaphoretic.  Eyes:     General: No scleral icterus.       Right eye: No discharge.        Left eye: No discharge.  Cardiovascular:     Rate and Rhythm: Normal rate and regular rhythm.     Heart sounds: Normal heart sounds. No murmur heard. Pulmonary:     Effort: Pulmonary effort is normal. No respiratory distress.     Breath sounds: Normal breath sounds.  Abdominal:     General: Bowel sounds are normal. There is distension.     Tenderness: There is generalized abdominal tenderness. There is no guarding or rebound. Negative signs include Murphy's sign and McBurney's sign.     Comments: Surgical scar present down center of abdomen.  Musculoskeletal:     Cervical back: Neck supple. No rigidity.     Right lower leg: No edema.     Left lower leg: No edema.  Skin:    General: Skin is warm and dry.  Neurological:     General: No focal deficit present.     Mental Status: He is alert and oriented to person, place, and time.  Psychiatric:        Mood and Affect: Mood normal.        Behavior: Behavior normal.     No results found for any visits on 06/02/22.      Assessment & Plan:   Kevin Smith was seen  today for nausea.  Diagnoses and all orders for this visit:  Generalized abdominal pain GERD Nausea -     DG Abd 1 View; Future -     promethazine (PHENERGAN) injection 25 mg  Constipation, unspecified constipation type -     DG Abd 1 View; Future  Distended abdomen on exam today with generalized tenderness. No guarding or rebound. He declined CT today. KUB today with moderate stool burden, no signs of obstruction. Phenergan IM today in office to help with nausea as zofran has no be helping. His daughter is driving him today. Discussed miralax BID for constipation. Discussed CT if no improvement by Monday. Discussed when to seek emergency care. Continue GERD regimen.   Return to office for new or  worsening symptoms, or if symptoms persist.   The patient indicates understanding of these issues and agrees with the plan.  [name], FNP

## 2022-06-02 NOTE — Addendum Note (Signed)
Addended by: [name] on: 06/02/2022 04:51 PM   Modules accepted: Level of Service

## 2022-06-05 ENCOUNTER — Other Ambulatory Visit: Payer: Self-pay | Admitting: *Deleted

## 2022-06-05 DIAGNOSIS — I480 Paroxysmal atrial fibrillation: Secondary | ICD-10-CM

## 2022-06-07 MED ORDER — AMIODARONE HCL 200 MG PO TABS: 200.0000 mg | ORAL_TABLET | Freq: Every day | ORAL | 0 refills | Status: DC

## 2022-06-07 NOTE — Telephone Encounter (Signed)
Has his cardiologist been prescribing this?

## 2022-06-07 NOTE — Telephone Encounter (Signed)
Stew called back from [place] checking on RF Medication filled by [facility] from hospital He has not filled this by Dr. Dineen Kid cardiologist before, last visit scheduled in Epic w/ him pt cancelled. Please advise on refill.

## 2022-06-07 NOTE — Telephone Encounter (Signed)
I've sent in a refill, however he does need to see his cardiologist management and for further refills.

## 2022-06-12 ENCOUNTER — Encounter (HOSPITAL_COMMUNITY): Payer: Self-pay

## 2022-06-12 ENCOUNTER — Emergency Department (HOSPITAL_COMMUNITY): Payer: Medicare HMO

## 2022-06-12 ENCOUNTER — Institutional Professional Consult (permissible substitution): Payer: Medicare HMO | Admitting: Pulmonary Disease

## 2022-06-12 ENCOUNTER — Other Ambulatory Visit: Payer: Self-pay

## 2022-06-12 ENCOUNTER — Inpatient Hospital Stay (HOSPITAL_COMMUNITY)
Admission: EM | Admit: 2022-06-12 | Discharge: 2022-06-16 | DRG: 391 | Disposition: A | Discharge status: Home Health | Payer: Medicare HMO | Attending: Family Medicine | Admitting: Family Medicine

## 2022-06-12 DIAGNOSIS — N401 Enlarged prostate with lower urinary tract symptoms: Secondary | ICD-10-CM | POA: Diagnosis present

## 2022-06-12 DIAGNOSIS — Z882 Allergy status to sulfonamides status: Secondary | ICD-10-CM

## 2022-06-12 DIAGNOSIS — I2510 Atherosclerotic heart disease of native coronary artery without angina pectoris: Secondary | ICD-10-CM | POA: Diagnosis present

## 2022-06-12 DIAGNOSIS — N200 Calculus of kidney: Secondary | ICD-10-CM | POA: Diagnosis not present

## 2022-06-12 DIAGNOSIS — R7989 Other specified abnormal findings of blood chemistry: Secondary | ICD-10-CM

## 2022-06-12 DIAGNOSIS — F411 Generalized anxiety disorder: Secondary | ICD-10-CM

## 2022-06-12 DIAGNOSIS — J9601 Acute respiratory failure with hypoxia: Secondary | ICD-10-CM | POA: Diagnosis not present

## 2022-06-12 DIAGNOSIS — F17210 Nicotine dependence, cigarettes, uncomplicated: Secondary | ICD-10-CM | POA: Diagnosis not present

## 2022-06-12 DIAGNOSIS — R0600 Dyspnea, unspecified: Secondary | ICD-10-CM | POA: Diagnosis not present

## 2022-06-12 DIAGNOSIS — Z7984 Long term (current) use of oral hypoglycemic drugs: Secondary | ICD-10-CM

## 2022-06-12 DIAGNOSIS — M545 Low back pain, unspecified: Secondary | ICD-10-CM | POA: Diagnosis not present

## 2022-06-12 DIAGNOSIS — Z79899 Other long term (current) drug therapy: Secondary | ICD-10-CM

## 2022-06-12 DIAGNOSIS — I2582 Chronic total occlusion of coronary artery: Secondary | ICD-10-CM | POA: Diagnosis not present

## 2022-06-12 DIAGNOSIS — I700 Atherosclerosis of aorta: Secondary | ICD-10-CM | POA: Diagnosis not present

## 2022-06-12 DIAGNOSIS — K5792 Diverticulitis of intestine, part unspecified, without perforation or abscess without bleeding: Secondary | ICD-10-CM | POA: Diagnosis not present

## 2022-06-12 DIAGNOSIS — N1831 Chronic kidney disease, stage 3a: Secondary | ICD-10-CM | POA: Diagnosis not present

## 2022-06-12 DIAGNOSIS — Z7901 Long term (current) use of anticoagulants: Secondary | ICD-10-CM

## 2022-06-12 DIAGNOSIS — N179 Acute kidney failure, unspecified: Secondary | ICD-10-CM | POA: Diagnosis present

## 2022-06-12 DIAGNOSIS — E785 Hyperlipidemia, unspecified: Secondary | ICD-10-CM

## 2022-06-12 DIAGNOSIS — Z823 Family history of stroke: Secondary | ICD-10-CM

## 2022-06-12 DIAGNOSIS — R339 Retention of urine, unspecified: Secondary | ICD-10-CM | POA: Diagnosis not present

## 2022-06-12 DIAGNOSIS — G43909 Migraine, unspecified, not intractable, without status migrainosus: Secondary | ICD-10-CM | POA: Diagnosis present

## 2022-06-12 DIAGNOSIS — I25709 Atherosclerosis of coronary artery bypass graft(s), unspecified, with unspecified angina pectoris: Secondary | ICD-10-CM

## 2022-06-12 DIAGNOSIS — F419 Anxiety disorder, unspecified: Secondary | ICD-10-CM

## 2022-06-12 DIAGNOSIS — I480 Paroxysmal atrial fibrillation: Secondary | ICD-10-CM | POA: Diagnosis not present

## 2022-06-12 DIAGNOSIS — Z951 Presence of aortocoronary bypass graft: Secondary | ICD-10-CM

## 2022-06-12 DIAGNOSIS — J449 Chronic obstructive pulmonary disease, unspecified: Secondary | ICD-10-CM

## 2022-06-12 DIAGNOSIS — R944 Abnormal results of kidney function studies: Secondary | ICD-10-CM | POA: Diagnosis not present

## 2022-06-12 DIAGNOSIS — K5732 Diverticulitis of large intestine without perforation or abscess without bleeding: Secondary | ICD-10-CM | POA: Diagnosis not present

## 2022-06-12 DIAGNOSIS — J4489 Other specified chronic obstructive pulmonary disease: Secondary | ICD-10-CM | POA: Diagnosis present

## 2022-06-12 DIAGNOSIS — I10 Essential (primary) hypertension: Secondary | ICD-10-CM

## 2022-06-12 DIAGNOSIS — H9191 Unspecified hearing loss, right ear: Secondary | ICD-10-CM | POA: Diagnosis present

## 2022-06-12 DIAGNOSIS — Z841 Family history of disorders of kidney and ureter: Secondary | ICD-10-CM

## 2022-06-12 DIAGNOSIS — F1010 Alcohol abuse, uncomplicated: Secondary | ICD-10-CM | POA: Diagnosis not present

## 2022-06-12 DIAGNOSIS — M549 Dorsalgia, unspecified: Secondary | ICD-10-CM | POA: Diagnosis not present

## 2022-06-12 DIAGNOSIS — I5022 Chronic systolic (congestive) heart failure: Secondary | ICD-10-CM

## 2022-06-12 DIAGNOSIS — M47816 Spondylosis without myelopathy or radiculopathy, lumbar region: Secondary | ICD-10-CM | POA: Diagnosis not present

## 2022-06-12 DIAGNOSIS — Z955 Presence of coronary angioplasty implant and graft: Secondary | ICD-10-CM | POA: Diagnosis not present

## 2022-06-12 DIAGNOSIS — K5900 Constipation, unspecified: Secondary | ICD-10-CM | POA: Diagnosis present

## 2022-06-12 DIAGNOSIS — G2581 Restless legs syndrome: Secondary | ICD-10-CM | POA: Diagnosis not present

## 2022-06-12 DIAGNOSIS — Z7902 Long term (current) use of antithrombotics/antiplatelets: Secondary | ICD-10-CM

## 2022-06-12 DIAGNOSIS — Z808 Family history of malignant neoplasm of other organs or systems: Secondary | ICD-10-CM

## 2022-06-12 DIAGNOSIS — Z806 Family history of leukemia: Secondary | ICD-10-CM

## 2022-06-12 DIAGNOSIS — K219 Gastro-esophageal reflux disease without esophagitis: Secondary | ICD-10-CM

## 2022-06-12 DIAGNOSIS — R531 Weakness: Secondary | ICD-10-CM | POA: Diagnosis present

## 2022-06-12 DIAGNOSIS — M1990 Unspecified osteoarthritis, unspecified site: Secondary | ICD-10-CM | POA: Diagnosis present

## 2022-06-12 DIAGNOSIS — M546 Pain in thoracic spine: Secondary | ICD-10-CM | POA: Diagnosis not present

## 2022-06-12 DIAGNOSIS — J811 Chronic pulmonary edema: Secondary | ICD-10-CM | POA: Diagnosis not present

## 2022-06-12 DIAGNOSIS — Z8249 Family history of ischemic heart disease and other diseases of the circulatory system: Secondary | ICD-10-CM

## 2022-06-12 DIAGNOSIS — Z833 Family history of diabetes mellitus: Secondary | ICD-10-CM

## 2022-06-12 DIAGNOSIS — J439 Emphysema, unspecified: Secondary | ICD-10-CM

## 2022-06-12 DIAGNOSIS — Z801 Family history of malignant neoplasm of trachea, bronchus and lung: Secondary | ICD-10-CM

## 2022-06-12 DIAGNOSIS — Z825 Family history of asthma and other chronic lower respiratory diseases: Secondary | ICD-10-CM

## 2022-06-12 DIAGNOSIS — M62838 Other muscle spasm: Secondary | ICD-10-CM | POA: Diagnosis present

## 2022-06-12 DIAGNOSIS — I252 Old myocardial infarction: Secondary | ICD-10-CM | POA: Diagnosis not present

## 2022-06-12 DIAGNOSIS — F332 Major depressive disorder, recurrent severe without psychotic features: Secondary | ICD-10-CM

## 2022-06-12 DIAGNOSIS — I130 Hypertensive heart and chronic kidney disease with heart failure and stage 1 through stage 4 chronic kidney disease, or unspecified chronic kidney disease: Secondary | ICD-10-CM | POA: Diagnosis not present

## 2022-06-12 DIAGNOSIS — R112 Nausea with vomiting, unspecified: Secondary | ICD-10-CM | POA: Diagnosis present

## 2022-06-12 DIAGNOSIS — J9811 Atelectasis: Secondary | ICD-10-CM | POA: Diagnosis not present

## 2022-06-12 DIAGNOSIS — Z981 Arthrodesis status: Secondary | ICD-10-CM

## 2022-06-12 LAB — URINALYSIS, ROUTINE W REFLEX MICROSCOPIC
Bilirubin Urine: NEGATIVE
Glucose, UA: 150 mg/dL — AB
Hgb urine dipstick: NEGATIVE
Ketones, ur: NEGATIVE mg/dL
Leukocytes,Ua: NEGATIVE
Nitrite: NEGATIVE
Protein, ur: NEGATIVE mg/dL
Specific Gravity, Urine: 1.039 — ABNORMAL HIGH (ref 1.005–1.030)
pH: 6 (ref 5.0–8.0)

## 2022-06-12 LAB — CBC WITH DIFFERENTIAL/PLATELET
Abs Immature Granulocytes: 0.04 10*3/uL (ref 0.00–0.07)
Basophils Absolute: 0.1 10*3/uL (ref 0.0–0.1)
Basophils Relative: 1 %
Eosinophils Absolute: 0.5 10*3/uL (ref 0.0–0.5)
Eosinophils Relative: 4 %
HCT: 49.1 % (ref 39.0–52.0)
Hemoglobin: 16.3 g/dL (ref 13.0–17.0)
Immature Granulocytes: 0 %
Lymphocytes Relative: 22 %
Lymphs Abs: 2.9 10*3/uL (ref 0.7–4.0)
MCH: 32.3 pg (ref 26.0–34.0)
MCHC: 33.2 g/dL (ref 30.0–36.0)
MCV: 97.4 fL (ref 80.0–100.0)
Monocytes Absolute: 1 10*3/uL (ref 0.1–1.0)
Monocytes Relative: 8 %
Neutro Abs: 8.6 10*3/uL — ABNORMAL HIGH (ref 1.7–7.7)
Neutrophils Relative %: 65 %
Platelets: 236 10*3/uL (ref 150–400)
RBC: 5.04 MIL/uL (ref 4.22–5.81)
RDW: 15 % (ref 11.5–15.5)
WBC: 13 10*3/uL — ABNORMAL HIGH (ref 4.0–10.5)
nRBC: 0 % (ref 0.0–0.2)

## 2022-06-12 LAB — COMPREHENSIVE METABOLIC PANEL
ALT: 30 U/L (ref 0–44)
AST: 23 U/L (ref 15–41)
Albumin: 4 g/dL (ref 3.5–5.0)
Alkaline Phosphatase: 71 U/L (ref 38–126)
Anion gap: 12 (ref 5–15)
BUN: 37 mg/dL — ABNORMAL HIGH (ref 8–23)
CO2: 23 mmol/L (ref 22–32)
Calcium: 9.2 mg/dL (ref 8.9–10.3)
Chloride: 99 mmol/L (ref 98–111)
Creatinine, Ser: 1.75 mg/dL — ABNORMAL HIGH (ref 0.61–1.24)
GFR, Estimated: 42 mL/min — ABNORMAL LOW (ref >60)
Glucose, Bld: 95 mg/dL (ref 70–99)
Potassium: 4.3 mmol/L (ref 3.5–5.1)
Sodium: 134 mmol/L — ABNORMAL LOW (ref 135–145)
Total Bilirubin: 0.9 mg/dL (ref 0.3–1.2)
Total Protein: 7.6 g/dL (ref 6.5–8.1)

## 2022-06-12 LAB — MAGNESIUM: Magnesium: 2.1 mg/dL (ref 1.7–2.4)

## 2022-06-12 LAB — I-STAT CHEM 8, ED
BUN: 38 mg/dL — ABNORMAL HIGH (ref 8–23)
Calcium, Ion: 1.2 mmol/L (ref 1.15–1.40)
Chloride: 100 mmol/L (ref 98–111)
Creatinine, Ser: 1.8 mg/dL — ABNORMAL HIGH (ref 0.61–1.24)
Glucose, Bld: 90 mg/dL (ref 70–99)
HCT: 49 % (ref 39.0–52.0)
Hemoglobin: 16.7 g/dL (ref 13.0–17.0)
Potassium: 4.5 mmol/L (ref 3.5–5.1)
Sodium: 137 mmol/L (ref 135–145)
TCO2: 26 mmol/L (ref 22–32)

## 2022-06-12 LAB — TROPONIN I (HIGH SENSITIVITY)
Troponin I (High Sensitivity): 11 ng/L (ref <18)
Troponin I (High Sensitivity): 11 ng/L (ref <18)

## 2022-06-12 LAB — LIPASE, BLOOD: Lipase: 35 U/L (ref 11–51)

## 2022-06-12 MED ORDER — APIXABAN 5 MG PO TABS
5.0000 mg | ORAL_TABLET | Freq: Two times a day (BID) | ORAL | Status: DC
  Administered 2022-06-12 – 2022-06-16 (×8): 5 mg via ORAL
  Filled 2022-06-12 (×8): qty 1

## 2022-06-12 MED ORDER — FENTANYL CITRATE (PF) 100 MCG/2ML IJ SOLN
100.0000 ug | Freq: Once | INTRAMUSCULAR | Status: AC
  Administered 2022-06-12: 100 ug via INTRAVENOUS
  Filled 2022-06-12: qty 2

## 2022-06-12 MED ORDER — BUDESON-GLYCOPYRROL-FORMOTEROL 160-9-4.8 MCG/ACT IN AERO: 2.0000 | INHALATION_SPRAY | Freq: Two times a day (BID) | RESPIRATORY_TRACT | Status: DC

## 2022-06-12 MED ORDER — ACETAMINOPHEN 650 MG RE SUPP: 650.0000 mg | Freq: Four times a day (QID) | RECTAL | Status: DC | PRN

## 2022-06-12 MED ORDER — METRONIDAZOLE 500 MG/100ML IV SOLN
500.0000 mg | Freq: Two times a day (BID) | INTRAVENOUS | Status: DC
  Administered 2022-06-13 – 2022-06-16 (×7): 500 mg via INTRAVENOUS
  Filled 2022-06-12 (×7): qty 100

## 2022-06-12 MED ORDER — METRONIDAZOLE 500 MG/100ML IV SOLN
500.0000 mg | Freq: Once | INTRAVENOUS | Status: AC
  Administered 2022-06-12: 500 mg via INTRAVENOUS
  Filled 2022-06-12: qty 100

## 2022-06-12 MED ORDER — LORAZEPAM 2 MG/ML IJ SOLN
1.0000 mg | INTRAMUSCULAR | Status: AC | PRN
  Administered 2022-06-13: 2 mg via INTRAVENOUS
  Administered 2022-06-13: 3 mg via INTRAVENOUS
  Administered 2022-06-14: 2 mg via INTRAVENOUS
  Filled 2022-06-12 (×2): qty 1
  Filled 2022-06-12: qty 2
  Filled 2022-06-12: qty 1

## 2022-06-12 MED ORDER — ACETAMINOPHEN 325 MG PO TABS
650.0000 mg | ORAL_TABLET | Freq: Four times a day (QID) | ORAL | Status: DC | PRN
  Administered 2022-06-13 – 2022-06-16 (×2): 650 mg via ORAL
  Filled 2022-06-12 (×2): qty 2

## 2022-06-12 MED ORDER — MORPHINE SULFATE (PF) 4 MG/ML IV SOLN
4.0000 mg | Freq: Once | INTRAVENOUS | Status: AC
  Administered 2022-06-12: 4 mg via INTRAVENOUS
  Filled 2022-06-12: qty 1

## 2022-06-12 MED ORDER — ADULT MULTIVITAMIN W/MINERALS CH
1.0000 | ORAL_TABLET | Freq: Every day | ORAL | Status: DC
  Administered 2022-06-12 – 2022-06-16 (×5): 1 via ORAL
  Filled 2022-06-12 (×4): qty 1

## 2022-06-12 MED ORDER — LORAZEPAM 1 MG PO TABS
1.0000 mg | ORAL_TABLET | ORAL | Status: AC | PRN
  Administered 2022-06-13 – 2022-06-15 (×5): 2 mg via ORAL
  Filled 2022-06-12 (×5): qty 2

## 2022-06-12 MED ORDER — THIAMINE MONONITRATE 100 MG PO TABS
100.0000 mg | ORAL_TABLET | Freq: Every day | ORAL | Status: DC
  Administered 2022-06-12 – 2022-06-16 (×5): 100 mg via ORAL
  Filled 2022-06-12 (×5): qty 1

## 2022-06-12 MED ORDER — UMECLIDINIUM BROMIDE 62.5 MCG/ACT IN AEPB
1.0000 | INHALATION_SPRAY | Freq: Every day | RESPIRATORY_TRACT | Status: DC
  Administered 2022-06-13 – 2022-06-16 (×3): 1 via RESPIRATORY_TRACT
  Filled 2022-06-12: qty 7

## 2022-06-12 MED ORDER — SODIUM CHLORIDE 0.9 % IV BOLUS
1000.0000 mL | Freq: Once | INTRAVENOUS | Status: AC
  Administered 2022-06-12: 1000 mL via INTRAVENOUS

## 2022-06-12 MED ORDER — IOHEXOL 350 MG/ML SOLN
75.0000 mL | Freq: Once | INTRAVENOUS | Status: AC | PRN
  Administered 2022-06-12: 75 mL via INTRAVENOUS

## 2022-06-12 MED ORDER — VENLAFAXINE HCL ER 75 MG PO CP24
150.0000 mg | ORAL_CAPSULE | Freq: Every day | ORAL | Status: DC
  Administered 2022-06-13 – 2022-06-16 (×4): 150 mg via ORAL
  Filled 2022-06-12 (×4): qty 2

## 2022-06-12 MED ORDER — THIAMINE HCL 100 MG/ML IJ SOLN
100.0000 mg | Freq: Every day | INTRAMUSCULAR | Status: DC
  Filled 2022-06-12: qty 2

## 2022-06-12 MED ORDER — ONDANSETRON HCL 4 MG/2ML IJ SOLN
4.0000 mg | Freq: Once | INTRAMUSCULAR | Status: AC
  Administered 2022-06-12: 4 mg via INTRAVENOUS
  Filled 2022-06-12: qty 2

## 2022-06-12 MED ORDER — ATORVASTATIN CALCIUM 40 MG PO TABS
80.0000 mg | ORAL_TABLET | Freq: Every day | ORAL | Status: DC
  Administered 2022-06-12 – 2022-06-16 (×5): 80 mg via ORAL
  Filled 2022-06-12 (×5): qty 2

## 2022-06-12 MED ORDER — CLOPIDOGREL BISULFATE 75 MG PO TABS
75.0000 mg | ORAL_TABLET | Freq: Every day | ORAL | Status: DC
  Administered 2022-06-12 – 2022-06-16 (×5): 75 mg via ORAL
  Filled 2022-06-12 (×5): qty 1

## 2022-06-12 MED ORDER — BUSPIRONE HCL 5 MG PO TABS
10.0000 mg | ORAL_TABLET | Freq: Two times a day (BID) | ORAL | Status: DC
  Administered 2022-06-12 – 2022-06-16 (×8): 10 mg via ORAL
  Filled 2022-06-12 (×8): qty 2

## 2022-06-12 MED ORDER — MORPHINE SULFATE (PF) 4 MG/ML IV SOLN
4.0000 mg | INTRAVENOUS | Status: DC | PRN
  Administered 2022-06-12 – 2022-06-14 (×6): 4 mg via INTRAVENOUS
  Filled 2022-06-12 (×6): qty 1

## 2022-06-12 MED ORDER — POLYETHYLENE GLYCOL 3350 17 G PO PACK: 17.0000 g | PACK | Freq: Every day | ORAL | Status: DC | PRN

## 2022-06-12 MED ORDER — ALBUTEROL SULFATE HFA 108 (90 BASE) MCG/ACT IN AERS: 2.0000 | INHALATION_SPRAY | Freq: Four times a day (QID) | RESPIRATORY_TRACT | Status: DC | PRN

## 2022-06-12 MED ORDER — SODIUM CHLORIDE 0.9 % IV SOLN
2.0000 g | Freq: Once | INTRAVENOUS | Status: AC
  Administered 2022-06-12: 2 g via INTRAVENOUS
  Filled 2022-06-12: qty 20

## 2022-06-12 MED ORDER — ZOLPIDEM TARTRATE 5 MG PO TABS
5.0000 mg | ORAL_TABLET | Freq: Every evening | ORAL | Status: DC | PRN
  Administered 2022-06-13 – 2022-06-14 (×2): 5 mg via ORAL
  Filled 2022-06-12 (×2): qty 1

## 2022-06-12 MED ORDER — PROMETHAZINE HCL 12.5 MG PO TABS
12.5000 mg | ORAL_TABLET | Freq: Three times a day (TID) | ORAL | Status: DC | PRN
  Administered 2022-06-13 – 2022-06-14 (×2): 12.5 mg via ORAL
  Filled 2022-06-12 (×2): qty 1

## 2022-06-12 MED ORDER — FOLIC ACID 1 MG PO TABS
1.0000 mg | ORAL_TABLET | Freq: Every day | ORAL | Status: DC
  Administered 2022-06-12 – 2022-06-16 (×5): 1 mg via ORAL
  Filled 2022-06-12 (×5): qty 1

## 2022-06-12 MED ORDER — METHOCARBAMOL 500 MG PO TABS
1000.0000 mg | ORAL_TABLET | Freq: Once | ORAL | Status: AC
  Administered 2022-06-12: 1000 mg via ORAL
  Filled 2022-06-12: qty 2

## 2022-06-12 MED ORDER — PANTOPRAZOLE SODIUM 40 MG PO TBEC
40.0000 mg | DELAYED_RELEASE_TABLET | Freq: Two times a day (BID) | ORAL | Status: DC
  Administered 2022-06-12 – 2022-06-16 (×8): 40 mg via ORAL
  Filled 2022-06-12 (×8): qty 1

## 2022-06-12 MED ORDER — MOMETASONE FURO-FORMOTEROL FUM 200-5 MCG/ACT IN AERO
2.0000 | INHALATION_SPRAY | Freq: Two times a day (BID) | RESPIRATORY_TRACT | Status: DC
  Administered 2022-06-12 – 2022-06-16 (×8): 2 via RESPIRATORY_TRACT
  Filled 2022-06-12: qty 8.8

## 2022-06-12 MED ORDER — AMIODARONE HCL 200 MG PO TABS
200.0000 mg | ORAL_TABLET | Freq: Every day | ORAL | Status: DC
  Administered 2022-06-12 – 2022-06-16 (×5): 200 mg via ORAL
  Filled 2022-06-12 (×5): qty 1

## 2022-06-12 MED ORDER — FUROSEMIDE 40 MG PO TABS
40.0000 mg | ORAL_TABLET | Freq: Every day | ORAL | Status: DC
  Administered 2022-06-13 – 2022-06-14 (×2): 40 mg via ORAL
  Filled 2022-06-12 (×2): qty 1

## 2022-06-12 MED ORDER — METOPROLOL SUCCINATE ER 50 MG PO TB24
50.0000 mg | ORAL_TABLET | Freq: Every day | ORAL | Status: DC
  Administered 2022-06-12 – 2022-06-16 (×5): 50 mg via ORAL
  Filled 2022-06-12 (×5): qty 1

## 2022-06-12 MED ORDER — ALBUTEROL SULFATE (2.5 MG/3ML) 0.083% IN NEBU
2.5000 mg | INHALATION_SOLUTION | RESPIRATORY_TRACT | Status: DC | PRN
  Administered 2022-06-14: 2.5 mg via RESPIRATORY_TRACT
  Filled 2022-06-12: qty 3

## 2022-06-12 MED ORDER — ROPINIROLE HCL 1 MG PO TABS
2.0000 mg | ORAL_TABLET | Freq: Every day | ORAL | Status: DC
  Administered 2022-06-12 – 2022-06-15 (×4): 2 mg via ORAL
  Filled 2022-06-12 (×4): qty 2

## 2022-06-12 MED ORDER — SODIUM CHLORIDE 0.9 % IV SOLN
2.0000 g | INTRAVENOUS | Status: DC
  Administered 2022-06-13 – 2022-06-15 (×3): 2 g via INTRAVENOUS
  Filled 2022-06-12 (×3): qty 20

## 2022-06-12 NOTE — Progress Notes (Signed)
Patient place on 2lpm cann by this RT, patient's spo2 88-89% on room air

## 2022-06-12 NOTE — Assessment & Plan Note (Signed)
Resume buspirone, venlafaxine

## 2022-06-12 NOTE — Assessment & Plan Note (Addendum)
Nausea, dry heaving, abdominal discomfort, back pain which may be more likely related to muscle spasm than abdominal pathology.  Afebrile without leukocytosis.  Rules out for sepsis.  CTA chest abdomen and pelvis suggests early acute diverticulitis. -Continue IV ceftriaxone and metronidazole -Bowel rest with clear liquid diet -Muscle relaxants- Methocarbamol for likely muscle spasm causing back pain -Reported urinary retention, patient denies this to me, 500 mL drained from bladder via Foley. -Consider voiding trial tomorrow

## 2022-06-12 NOTE — ED Triage Notes (Signed)
Patient complains of lower back pain that started upon waking up. Patient denies any injury. Says he could not get up and walk to the bathroom

## 2022-06-12 NOTE — ED Provider Notes (Signed)
[facility] Provider Note   CSN: [id number] Arrival date & time: 06/12/22  [id number]     History  Chief Complaint  Patient presents with   Back Pain    Kevin Smith is a 67 y.o. male.  Patient with history of hypertension, COPD, hyperlipidemia, STEMI, CABG, ascending aortic aneurysm, afib on Elliquis, CHF, CKD presents today with complaints of back pain. He states that same woke him up from rest this morning, is located in his left back and into his abdomen and is severe. He has never felt this pain before. He states that he was in good health when he went to bed last night. Denies chest pain or shortness of breath.  Denies any loss of bowel or bladder function or saddle paresthesias.  Denies any recent heavy lifting or trauma.  Does endorse nausea but denies vomiting or diarrhea.  No hematuria or dysuria. Denies numbness or tingling in his extremities. Of note, patient has not urinated since last night.  The history is provided by the patient. No language interpreter was used.  Back Pain Associated symptoms: abdominal pain        Home Medications Prior to Admission medications   Medication Sig Start Date End Date Taking? Authorizing Provider  acetaminophen (TYLENOL) 500 MG tablet Take 1 tablet (500 mg total) by mouth every 6 (six) hours as needed for mild pain. 01/02/22   Arrien, Jimmy Picket, MD  albuterol (PROVENTIL) (2.5 MG/3ML) 0.083% nebulizer solution Take 3 mLs (2.5 mg total) by nebulization every 4 (four) hours as needed for wheezing or shortness of breath. 03/09/22   Roxan Hockey, MD  amiodarone (PACERONE) 200 MG tablet Take 1 tablet (200 mg total) by mouth daily. 06/07/22   [name], FNP  apixaban (ELIQUIS) 5 MG TABS tablet Take 1 tablet (5 mg total) by mouth 2 (two) times daily. 03/09/22   Roxan Hockey, MD  atorvastatin (LIPITOR) 80 MG tablet Take 1 tablet (80 mg total) by mouth daily. 01/09/22   Janora Norlander, DO  Budeson-Glycopyrrol-Formoterol (BREZTRI AEROSPHERE) 160-9-4.8 MCG/ACT AERO Inhale 2 puffs into the lungs 2 (two) times daily. 05/11/22   [name], FNP  busPIRone (BUSPAR) 10 MG tablet Take 1 tablet (10 mg total) by mouth 2 (two) times daily. 05/24/22   [name], FNP  clopidogrel (PLAVIX) 75 MG tablet Take 1 tablet (75 mg total) by mouth daily. 03/09/22   Roxan Hockey, MD  desvenlafaxine (PRISTIQ) 100 MG 24 hr tablet Take 1 tablet (100 mg total) by mouth daily. 05/05/22   [name], FNP  empagliflozin (JARDIANCE) 10 MG TABS tablet Take 1 tablet (10 mg total) by mouth daily. 03/31/22   Furth, Cadence H, PA-C  ezetimibe (ZETIA) 10 MG tablet Take 1 tablet (10 mg total) by mouth daily. 03/14/22   [name], NP  folic acid (FOLVITE) 1 MG tablet Take 1 tablet (1 mg total) by mouth daily. 03/10/22   Roxan Hockey, MD  furosemide (LASIX) 40 MG tablet Take 40 mg by mouth daily. 05/26/22   [provider]  metoprolol succinate (TOPROL XL) 50 MG 24 hr tablet Take 1 tablet (50 mg total) by mouth daily. Take with or immediately following a meal. 03/09/22 03/09/23  Roxan Hockey, MD  nitroGLYCERIN (NITROSTAT) 0.4 MG SL tablet Place 1 tablet (0.4 mg total) under the tongue every 5 (five) minutes x 3 doses as needed for chest pain. 03/14/22   [name], NP  ondansetron (ZOFRAN) 4 MG tablet Take 1 tablet (4 mg total) by mouth every 8 (eight) hours as needed for nausea or vomiting. 04/21/22   [name], FNP  pantoprazole (PROTONIX) 40 MG tablet Take 1 tablet (40 mg total) by mouth 2 (two) times daily. Take on an empty stomach 05/05/22   [name], FNP  rOPINIRole (REQUIP) 1 MG tablet Take 2 tablets (2 mg total) by mouth at bedtime. 04/07/22   [name], FNP  valACYclovir HCl (VALTREX PO) Take by mouth.    [provider]  VENTOLIN HFA 108 (90 Base) MCG/ACT inhaler Inhale 2 puffs into the lungs every 6 (six) hours as needed for wheezing  or shortness of breath. 03/09/22   Roxan Hockey, MD  zolpidem (AMBIEN) 5 MG tablet Take 1 tablet (5 mg total) by mouth at bedtime as needed. for sleep 04/10/22   [name], FNP      Allergies    Sulfa antibiotics    Review of Systems   Review of Systems  Gastrointestinal:  Positive for abdominal pain and nausea.  Musculoskeletal:  Positive for back pain.  All other systems reviewed and are negative.   Physical Exam Updated Vital Signs BP 126/86   Pulse 71   Temp 97.7 F (36.5 C)   Resp 18   Ht 5\' 11"  (1.803 m)   Wt 107 kg   SpO2 95%   BMI 32.92 kg/m  Physical Exam Vitals and nursing note reviewed.  Constitutional:      General: He is not in acute distress.    Appearance: Normal appearance. He is normal weight. He is not ill-appearing, toxic-appearing or diaphoretic.     Comments: Patient obviously uncomfortable screaming out in the room and rolling around in bed.   HENT:     Head: Normocephalic and atraumatic.  Cardiovascular:     Rate and Rhythm: Normal rate.     Heart sounds: Normal heart sounds.     Comments: DP, PT, and radial pulses intact and 2+ bilaterally.  Pulmonary:     Effort: Pulmonary effort is normal. No respiratory distress.  Abdominal:     Comments: TTP of the left flank and generalized throughout the abdomen  Musculoskeletal:        General: Normal range of motion.     Cervical back: Normal range of motion.  Skin:    General: Skin is warm and dry.  Neurological:     General: No focal deficit present.     Mental Status: He is alert and oriented to person, place, and time.     Comments: Strength and sensation intact to bilateral lower extremities  Psychiatric:        Mood and Affect: Mood normal.        Behavior: Behavior normal.     ED Results / Procedures / Treatments   Labs (all labs ordered are listed, but only abnormal results are displayed) Labs Reviewed  CBC WITH DIFFERENTIAL/PLATELET - Abnormal; Notable for the following  components:      Result Value   WBC 13.0 (*)    Neutro Abs 8.6 (*)    All other components within normal limits  COMPREHENSIVE METABOLIC PANEL - Abnormal; Notable for the following components:   Sodium 134 (*)    BUN 37 (*)    Creatinine, Ser 1.75 (*)    GFR, Estimated 42 (*)    All other components within normal limits  URINALYSIS, ROUTINE W REFLEX MICROSCOPIC - Abnormal; Notable  for the following components:   Specific Gravity, Urine 1.039 (*)    Glucose, UA 150 (*)    All other components within normal limits  I-STAT CHEM 8, ED - Abnormal; Notable for the following components:   BUN 38 (*)    Creatinine, Ser 1.80 (*)    All other components within normal limits  LIPASE, BLOOD  TROPONIN I (HIGH SENSITIVITY)  TROPONIN I (HIGH SENSITIVITY)    EKG None  Radiology CT Angio Chest/Abd/Pel for Dissection W and/or Wo Contrast  Addendum Date: 06/12/2022   ADDENDUM REPORT: 06/12/2022 11:01 ADDENDUM: I performed limited review of study to assess for dictation error since dictating radiologist not available. "Stranding about a diverticulum in the mid sigmoid colon" mentioned in findings but not in impression. Corrected as follows: Findings are concerning for early acute diverticulitis. Discussed 06/12/2022 at 10:59 am to provider [facility]illo . Electronically Signed   By: Yetta Glassman M.D.   On: 06/12/2022 11:01   Result Date: 06/12/2022 CLINICAL DATA:  Onset low back pain when the patient woke up this morning. EXAM: CT ANGIOGRAPHY CHEST, ABDOMEN AND PELVIS TECHNIQUE: Non-contrast CT of the chest was initially obtained. Multidetector CT imaging through the chest, abdomen and pelvis was performed using the standard protocol during bolus administration of intravenous contrast. Multiplanar reconstructed images and MIPs were obtained and reviewed to evaluate the vascular anatomy. RADIATION DOSE REDUCTION: This exam was performed according to the departmental dose-optimization program which includes  automated exposure control, adjustment of the mA and/or kV according to patient size and/or use of iterative reconstruction technique. CONTRAST:  75 mL OMNIPAQUE IOHEXOL 350 MG/ML SOLN COMPARISON:  CT chest 08/23/2021. FINDINGS: CTA CHEST FINDINGS Cardiovascular: Preferential opacification of the thoracic aorta. Negative for aortic dissection. The ascending thoracic aorta measures approximately 4.1 cm, unchanged. Normal heart size. No pericardial effusion. Extensive calcific aortic and coronary atherosclerosis noted. The patient is status post CABG. Mediastinum/Nodes: No enlarged mediastinal, hilar, or axillary lymph nodes. Thyroid gland, trachea, and esophagus demonstrate no significant findings. Lungs/Pleura: No pleural effusion. Emphysematous disease again seen. There is some dependent atelectatic change. Airways negative. Musculoskeletal: No acute or focal abnormality. Review of the MIP images confirms the above findings. CTA ABDOMEN AND PELVIS FINDINGS VASCULAR Aorta: Normal caliber aorta without aneurysm, dissection, vasculitis or significant stenosis. Celiac: Patent without evidence of aneurysm, dissection, vasculitis or significant stenosis. SMA: Patent without evidence of aneurysm, dissection, vasculitis or significant stenosis. Renals: Both renal arteries are patent without evidence of aneurysm, dissection, vasculitis, fibromuscular dysplasia or significant stenosis. Accessory renal arteries to the superior and inferior poles of the right kidney are noted. IMA: Patent without evidence of aneurysm, dissection, vasculitis or significant stenosis. Inflow: Patent without evidence of aneurysm, dissection, vasculitis or significant stenosis. Veins: No obvious venous abnormality within the limitations of this arterial phase study. Review of the MIP images confirms the above findings. NON-VASCULAR Hepatobiliary: No focal liver abnormality is seen. No gallstones, gallbladder wall thickening, or biliary dilatation.  Pancreas: Unremarkable. No pancreatic ductal dilatation or surrounding inflammatory changes. Spleen: Normal in size without focal abnormality. Adrenals/Urinary Tract: Adrenal glands are unremarkable. Kidneys are normal, without renal calculi, solid focal lesion, or hydronephrosis. Bladder is unremarkable. Small bilateral renal cysts noted. Stomach/Bowel: The patient has sigmoid diverticulosis. There is stranding about a diverticulum in the mid sigmoid colon. No abscess or perforation. The colon is otherwise unremarkable. The stomach and small bowel appear normal. No evidence of appendicitis. Lymphatic: No lymphadenopathy. Reproductive: Prostate is unremarkable. Other: Small bilateral fat containing  inguinal hernias are seen. Musculoskeletal: No acute or focal abnormality. Healed right intertrochanteric fracture with fixation hardware in place noted. Review of the MIP images confirms the above findings. IMPRESSION: Negative for aortic dissection. Findings consistent with sigmoid diverticulosis without diverticulitis. No change in a 4.1 cm ascending thoracic aortic aneurysm Recommend annual imaging followup by CTA or MRA. This recommendation follows 2010 ACCF/AHA/AATS/ACR/ASA/SCA/SCAI/SIR/STS/SVM Guidelines for the Diagnosis and Management of Patients with Thoracic Aortic Disease. Circulation. 2010; [id number]. Aortic aneurysm NOS (ICD10-I71.9). Electronically Signed: By: Inge Rise M.D. On: 06/12/2022 10:39    Procedures Procedures    Medications Ordered in ED Medications  fentaNYL (SUBLIMAZE) injection 100 mcg (100 mcg Intravenous Given 06/12/22 [id number])    ED Course/ Medical Decision Making/ A&P Clinical Course as of 06/12/22 1030  Mon Jun 11, 3356  [id number] 67 year old male history of cardiovascular disease known thoracic aneurysm here with left lower back and abdominal pain.  Getting labs urinalysis CT imaging.  Disposition per results of testing. [MB]    Clinical Course User Index [MB] Hayden Rasmussen, MD                             Medical Decision Making Amount and/or Complexity of Data Reviewed Labs: ordered. Radiology: ordered.  Risk Prescription drug management.   This patient is a 67 y.o. male who presents to the ED for concern of back pain, this involves an extensive number of treatment options, and is a complaint that carries with it a high risk of complications and morbidity. The emergent differential diagnosis prior to evaluation includes, but is not limited to,  ACS, AAA, kidney stone, pancreatitis . This is not an exhaustive differential.   Past Medical History / Co-morbidities / Social History: History of hypertension, COPD, hyperlipidemia, STEMI, CABG, ascending aortic aneurysm, afib on Elliquis, CHF, CKD  Additional history: Chart reviewed. Pertinent results include: Last CTA 08/23/21 showed 4.1 cm ascending aortic aneurysm   Physical Exam: Physical exam performed. The pertinent findings include: left CVA tenderness and generalized abdominal tenderness  Lab Tests: I ordered, and personally interpreted labs.  The pertinent results include:  WBC 13. Creatinine 1.75 up from 1.49 2 months ago. No other acute laboratory findings.  Troponins negative and flat.   Imaging Studies: I ordered imaging studies including CTA dissection study. I independently visualized and interpreted imaging which showed   Negative for aortic dissection.   No change in a 4.1 cm ascending thoracic aortic aneurysm   Findings are concerning for early acute diverticulitis.  I agree with the radiologist interpretation.   Cardiac Monitoring:  The patient was maintained on a cardiac monitor.  My attending physician Dr. Melina Copa viewed and interpreted the cardiac monitored which showed an underlying rhythm of: no STEMI, consistent with previous. I agree with this interpretation.   Medications: I ordered medication including fluids, zofran, morphine, fentanyl, rocephin, and flagyl  for  dehydration, nausea, pain, diverticulitis. Reevaluation of the patient after these medicines showed that the patient improved. I have reviewed the patients home medicines and have made adjustments as needed.   Disposition:  Patient presents today with complaints of sudden onset back pain that woke him up from sleep this morning. Initially found to be obviously uncomfortable screaming and rolling around in bed. Given hx aortic aneurysm, dissection study was obtained which was negative.  He did have signs of developing acute diverticulitis.  Additionally, despite 1 L of fluids patient was unable to void  and bladder scan showed greater than 500 mL. Foley catheter was then placed which did give patient some pain relief but he is still uncomfortable and repeatedly requesting admission for pain control. He does also have a slight bump in his creatinine and leukocytosis likely from diverticulitis. Will seek admission for management of diverticulitis as well as pain control. Patient is understanding and in agreement.  Discussed patient with hospitalist who agrees to admit.   This is a shared visit with supervising physician Dr. Melina Copa who has independently evaluated patient & provided guidance in evaluation/management/disposition, in agreement with care    Final Clinical Impression(s) / ED Diagnoses Final diagnoses:  Diverticulitis large intestine w/o perforation or abscess w/o bleeding  Urinary retention  Elevated serum creatinine    Rx / DC Orders ED Discharge Orders     None         Nestor Lewandowsky 06/12/22 1523    Hayden Rasmussen, MD 06/12/22 [phone]

## 2022-06-12 NOTE — Assessment & Plan Note (Addendum)
Stable. -Resume metoprolol, lasix

## 2022-06-12 NOTE — Assessment & Plan Note (Signed)
Stable.  Resume home regimen 

## 2022-06-12 NOTE — Assessment & Plan Note (Signed)
Smokes about 2 packs of cigarettes daily.  Patient not ready to quit. -Strongly counseled patient to quit, risks of ongoing tobacco use explained to patient, he voiced understanding. -Nicotine patch, patient declined

## 2022-06-12 NOTE — Assessment & Plan Note (Signed)
Stable.  No chest pain.  EKG without significant change from prior.  Troponin 11  x 2. -Resume Eliquis, Plavix, atorvastatin

## 2022-06-12 NOTE — Assessment & Plan Note (Addendum)
Currently in sinus rhythm.  Rate controlled and on anticoagulation with Eliquis. - Resume Eliquis, metoprolol, amiodarone -QTc prolonged at 518, he is on amiodarone, check magnesium

## 2022-06-12 NOTE — Assessment & Plan Note (Signed)
Reports used to drink heavily previously, denies history of alcohol withdrawal or seizures.  Reports he drinks on average 1-2 beers every other day.   - CIWA As needed and scheduled -Thiamine folate multivitamins - K- 4.3,  add-on Mag

## 2022-06-12 NOTE — Assessment & Plan Note (Addendum)
Stable and compensated.  Reports compliance with Lasix 40 mg daily.  Last echo 02/2022 EF of 30 to 35% -1 L bolus given -Resume Lasix 40 mg daily in a.m, metop -Hold Jardiance
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
## 2022-06-13 DIAGNOSIS — K5900 Constipation, unspecified: Secondary | ICD-10-CM | POA: Diagnosis present

## 2022-06-13 DIAGNOSIS — I2510 Atherosclerotic heart disease of native coronary artery without angina pectoris: Secondary | ICD-10-CM | POA: Diagnosis present

## 2022-06-13 DIAGNOSIS — H9191 Unspecified hearing loss, right ear: Secondary | ICD-10-CM | POA: Diagnosis present

## 2022-06-13 DIAGNOSIS — Z8249 Family history of ischemic heart disease and other diseases of the circulatory system: Secondary | ICD-10-CM | POA: Diagnosis not present

## 2022-06-13 DIAGNOSIS — R339 Retention of urine, unspecified: Secondary | ICD-10-CM | POA: Diagnosis present

## 2022-06-13 DIAGNOSIS — F17210 Nicotine dependence, cigarettes, uncomplicated: Secondary | ICD-10-CM | POA: Diagnosis present

## 2022-06-13 DIAGNOSIS — R0600 Dyspnea, unspecified: Secondary | ICD-10-CM | POA: Diagnosis not present

## 2022-06-13 DIAGNOSIS — N179 Acute kidney failure, unspecified: Secondary | ICD-10-CM | POA: Diagnosis present

## 2022-06-13 DIAGNOSIS — I2582 Chronic total occlusion of coronary artery: Secondary | ICD-10-CM | POA: Diagnosis present

## 2022-06-13 DIAGNOSIS — K5792 Diverticulitis of intestine, part unspecified, without perforation or abscess without bleeding: Secondary | ICD-10-CM | POA: Diagnosis not present

## 2022-06-13 DIAGNOSIS — N401 Enlarged prostate with lower urinary tract symptoms: Secondary | ICD-10-CM | POA: Diagnosis present

## 2022-06-13 DIAGNOSIS — F1010 Alcohol abuse, uncomplicated: Secondary | ICD-10-CM | POA: Diagnosis present

## 2022-06-13 DIAGNOSIS — I5022 Chronic systolic (congestive) heart failure: Secondary | ICD-10-CM | POA: Diagnosis present

## 2022-06-13 DIAGNOSIS — I480 Paroxysmal atrial fibrillation: Secondary | ICD-10-CM | POA: Diagnosis present

## 2022-06-13 DIAGNOSIS — I252 Old myocardial infarction: Secondary | ICD-10-CM | POA: Diagnosis not present

## 2022-06-13 DIAGNOSIS — I130 Hypertensive heart and chronic kidney disease with heart failure and stage 1 through stage 4 chronic kidney disease, or unspecified chronic kidney disease: Secondary | ICD-10-CM | POA: Diagnosis present

## 2022-06-13 DIAGNOSIS — J4489 Other specified chronic obstructive pulmonary disease: Secondary | ICD-10-CM | POA: Diagnosis present

## 2022-06-13 DIAGNOSIS — Z955 Presence of coronary angioplasty implant and graft: Secondary | ICD-10-CM | POA: Diagnosis not present

## 2022-06-13 DIAGNOSIS — J811 Chronic pulmonary edema: Secondary | ICD-10-CM | POA: Diagnosis not present

## 2022-06-13 DIAGNOSIS — M545 Low back pain, unspecified: Secondary | ICD-10-CM | POA: Diagnosis not present

## 2022-06-13 DIAGNOSIS — F419 Anxiety disorder, unspecified: Secondary | ICD-10-CM | POA: Diagnosis present

## 2022-06-13 DIAGNOSIS — Z7901 Long term (current) use of anticoagulants: Secondary | ICD-10-CM | POA: Diagnosis not present

## 2022-06-13 DIAGNOSIS — J9601 Acute respiratory failure with hypoxia: Secondary | ICD-10-CM | POA: Diagnosis not present

## 2022-06-13 DIAGNOSIS — E785 Hyperlipidemia, unspecified: Secondary | ICD-10-CM | POA: Diagnosis present

## 2022-06-13 DIAGNOSIS — M546 Pain in thoracic spine: Secondary | ICD-10-CM | POA: Diagnosis not present

## 2022-06-13 DIAGNOSIS — M47816 Spondylosis without myelopathy or radiculopathy, lumbar region: Secondary | ICD-10-CM | POA: Diagnosis not present

## 2022-06-13 DIAGNOSIS — J9811 Atelectasis: Secondary | ICD-10-CM | POA: Diagnosis not present

## 2022-06-13 DIAGNOSIS — N1831 Chronic kidney disease, stage 3a: Secondary | ICD-10-CM | POA: Diagnosis present

## 2022-06-13 DIAGNOSIS — Z79899 Other long term (current) drug therapy: Secondary | ICD-10-CM | POA: Diagnosis not present

## 2022-06-13 DIAGNOSIS — G2581 Restless legs syndrome: Secondary | ICD-10-CM | POA: Diagnosis present

## 2022-06-13 DIAGNOSIS — K5732 Diverticulitis of large intestine without perforation or abscess without bleeding: Secondary | ICD-10-CM | POA: Diagnosis present

## 2022-06-13 LAB — CBC
HCT: 44.7 % (ref 39.0–52.0)
Hemoglobin: 14.5 g/dL (ref 13.0–17.0)
MCH: 32.1 pg (ref 26.0–34.0)
MCHC: 32.4 g/dL (ref 30.0–36.0)
MCV: 98.9 fL (ref 80.0–100.0)
Platelets: 205 10*3/uL (ref 150–400)
RBC: 4.52 MIL/uL (ref 4.22–5.81)
RDW: 15.1 % (ref 11.5–15.5)
WBC: 11.7 10*3/uL — ABNORMAL HIGH (ref 4.0–10.5)
nRBC: 0 % (ref 0.0–0.2)

## 2022-06-13 LAB — BASIC METABOLIC PANEL
Anion gap: 9 (ref 5–15)
BUN: 26 mg/dL — ABNORMAL HIGH (ref 8–23)
CO2: 24 mmol/L (ref 22–32)
Calcium: 8.7 mg/dL — ABNORMAL LOW (ref 8.9–10.3)
Chloride: 101 mmol/L (ref 98–111)
Creatinine, Ser: 1.46 mg/dL — ABNORMAL HIGH (ref 0.61–1.24)
GFR, Estimated: 53 mL/min — ABNORMAL LOW (ref >60)
Glucose, Bld: 84 mg/dL (ref 70–99)
Potassium: 4.1 mmol/L (ref 3.5–5.1)
Sodium: 134 mmol/L — ABNORMAL LOW (ref 135–145)

## 2022-06-13 MED ORDER — CHLORHEXIDINE GLUCONATE CLOTH 2 % EX PADS
6.0000 | MEDICATED_PAD | Freq: Every day | CUTANEOUS | Status: DC
  Administered 2022-06-13 – 2022-06-16 (×4): 6 via TOPICAL

## 2022-06-13 MED ORDER — CYCLOBENZAPRINE HCL 5 MG PO TABS: 7.5000 mg | ORAL_TABLET | Freq: Three times a day (TID) | ORAL | Status: DC | PRN

## 2022-06-13 NOTE — TOC Progression Note (Signed)
  Transition of Care [facility]) Screening Note   Patient Details  Name: Abdon Moeller Date of Birth: 13-Feb-1956   [facility]) CM/SW Contact:    Boneta Lucks, RN Phone Number: 06/13/2022, 12:16 PM    Transition of Care Department [facility]) has reviewed patient and no TOC needs have been identified at this time. We will continue to monitor patient advancement through interdisciplinary progression rounds. If new patient transition needs arise, please place a TOC consult.     Barriers to Discharge: Continued Medical Work up  Expected Discharge Plan and Services      Social Determinants of Health (SDOH) Interventions SDOH Screenings   Food Insecurity: No Food Insecurity (06/12/2022)  Housing: Low Risk  (06/12/2022)  Transportation Needs: No Transportation Needs (06/12/2022)  Recent Concern: Transportation Needs - Unmet Transportation Needs (04/14/2022)  Utilities: Not At Risk (06/12/2022)  Alcohol Screen: Medium Risk (11/25/2020)  Depression (PHQ2-9): High Risk (05/24/2022)  Financial Resource Strain: Medium Risk (04/28/2022)  Physical Activity: Insufficiently Active (11/25/2020)  Social Connections: Socially Isolated (11/25/2020)  Stress: No Stress Concern Present (11/25/2020)  Tobacco Use: High Risk (06/12/2022)    Readmission Risk Interventions    03/07/2022   12:41 PM  Readmission Risk Prevention Plan  Transportation Screening Complete  Home Care Screening Complete  Medication Review (RN CM) Complete

## 2022-06-13 NOTE — Progress Notes (Signed)
Mobility Specialist Progress Note:    06/13/22 1345  Mobility  Activity Ambulated with assistance in hallway  Level of Assistance Modified independent, requires aide device or extra time  Assistive Device Other (Comment) (Handrails+ Objects in hallway)  Distance Ambulated (ft) 140 ft  Activity Response Tolerated well  Mobility Referral Yes  $Mobility charge 1 Mobility   Pt agreeable to mobility session. Tolerated well, had audible SOB and wheezing throughout session (see O2 sats below). Required ModI with handrails and CGA to stay balanced. Pt reported back pain. Returned pt to chair in room, used inhaler. Left pt in chair, all needs met, call bell in reach, nurse in room.   Pre mobility: SpO2 88% on RA  During Mobility: SpO2 85% on RA during ambulation, recovered SpO2 89% on 3L Post Mobility: SpO2 90% on 2L  Royetta Crochet Mobility Specialist Please contact via [employer] or  Rehab office at [phone]

## 2022-06-13 NOTE — Progress Notes (Addendum)
PROGRESS NOTE    Kevin Smith  [id number] DOB: February 03, 1956 DOA: 06/12/2022 PCP: [name], FNP   Brief Narrative:  HPI: Kevin Smith is a 67 y.o. male with medical history significant for COPD, STEMI, Atria Fib, alcohol and tobacco abuse, systolic CHF. Patient presented to the ED with complaints of low back pain that started this morning.  Denies history of back pains.  Reports abdominal discomfort but without real pain over the past few days.  Reports nausea and dry heaving without actual vomiting.  Last bowel movement was yesterday.  No blood in stool.  No fever.  Reports some pain with urination over the past few days, that was not with Foley placement today in the ED.  He is on Lasix and otherwise has not had any problems with voiding.    ED Course: Stable vitals.  CT Angio-chest abdomen and pelvis- negative for dissection, with findings suggestive of early acute diverticulitis.  UA- Not suggestive of infection.  Per ED provider, patient has been complaining of not being able to void since yesterday, bladder scan showed 500 mL of urine, Foley was placed with drainage of 500 mL of urine.  1 L bolus given.  Patient's pain was uncontrolled despite initial 100 mg of fentanyl and later 4 mg of morphine.  IV ceftriaxone and metronidazole started.  Hospitalization was requested.   Assessment & Plan:   Principal Problem:   Diverticulitis Active Problems:   Systolic CHF, chronic   Primary hypertension   COPD GOLD 2    Alcohol abuse   S/P CABG x 3   Cigarette smoker   Anxiety   Paroxysmal atrial fibrillation  Acute sigmoid diverticulitis:  Interestingly, he does not have any abdominal pain.  He is complaining of back pain.  CT positive for diverticulitis.  Continue current antibiotics, Rocephin and Flagyl.   Systolic CHF, chronic Stable and compensated.  Reports compliance with Lasix 40 mg daily.  Last echo 02/2022 EF of 30 to 35% -1 L bolus given in the ED.  Continue Lasix,  holding Jardiance.   Primary hypertension Stable.  Continue Lasix and metoprolol.   COPD GOLD 2  Stable.  Continue home medications.   Alcohol abuse  reports used to drink heavily previously, up to 6-12 beers a day up until 3 weeks ago, denies history of alcohol withdrawal or seizures.  To the admitting physician, he reported that he drinks on average 1-2 beers every other day, however when I asked him, he tells me he drinks about 4-5 beers a day.  Per nursing, he was significantly agitated earlier , just few minutes before I saw him and they documented his CIWA 40.  During my evaluation, patient was actually angry and I calmed him down.  I do not believe his CIWA was 19.  His CIWA was barely 5.  He did not have any anxiety.  He did not have any tremors either.  I do not think he is withdrawing but we will continue him on CIWA protocol with as needed Ativan and multivitamins.   Paroxysmal atrial fibrillation Currently in sinus rhythm.  Rate controlled, continue Eliquis, metoprolol and amiodarone however he has mild bradycardia and he is on 75 mg of metoprolol twice daily, I will reduce that to 50 mg p.o. twice daily.     Anxiety Continue buspirone, venlafaxine   Cigarette smoker Smokes about 2 packs of cigarettes daily.  Patient not ready to quit. -Strongly counseled patient to quit, risks of ongoing tobacco use explained  to patient, he voiced understanding. -Nicotine patch, patient declined   S/P CABG x 3 Stable.  No chest pain.  EKG without significant change from prior.  Troponin 11  x 2. Continue Eliquis, Plavix, atorvastatin and metoprolol.  Back pain: Patient is mainly complaining of back pain, that was his main reason to come to the emergency department.  On my examination, he has paraspinal tenderness, consistent with musculoskeletal spasm/pain.  Will start him on Flexeril 3 times daily.  No history of trauma or fall.  CKD stage IIIa: At baseline.  DVT prophylaxis: eliquis    Code Status: Full Code  Family Communication:  None present at bedside.  Plan of care discussed with patient in length and he/she verbalized understanding and agreed with it.  Status is: Observation The patient will require care spanning > 2 midnights and should be moved to inpatient because: Patient with significant back pain.   Estimated body mass index is 32.92 kg/m as calculated from the following:   Height as of this encounter: 5\' 11"  (1.803 m).   Weight as of this encounter: 107 kg.    Nutritional Assessment: Body mass index is 32.92 kg/m.Marland Kitchen Seen by dietician.  I agree with the assessment and plan as outlined below: Nutrition Status:        . Skin Assessment: I have examined the patient's skin and I agree with the wound assessment as performed by the wound care RN as outlined below:    Consultants:  None  Procedures:  None  Antimicrobials:  Anti-infectives (From admission, onward)    Start     Dose/Rate Route Frequency Ordered Stop   06/13/22 1500  cefTRIAXone (ROCEPHIN) 2 g in sodium chloride 0.9 % 100 mL IVPB        2 g 200 mL/hr over 30 Minutes Intravenous Every 24 hours 06/12/22 1831     06/13/22 0300  metroNIDAZOLE (FLAGYL) IVPB 500 mg        500 mg 100 mL/hr over 60 Minutes Intravenous Every 12 hours 06/12/22 1831     06/12/22 1500  cefTRIAXone (ROCEPHIN) 2 g in sodium chloride 0.9 % 100 mL IVPB       See Hyperspace for full Linked Orders Report.   2 g 200 mL/hr over 30 Minutes Intravenous  Once 06/12/22 1455 06/12/22 1723   06/12/22 1500  metroNIDAZOLE (FLAGYL) IVPB 500 mg       See Hyperspace for full Linked Orders Report.   500 mg 100 mL/hr over 60 Minutes Intravenous  Once 06/12/22 1455 06/12/22 1723         Subjective: Patient seen and examined.  He was screaming in pain, mainly because of the back pain.  He did not really have any abdominal pain.  No nausea.  No other complaint.  He was calm down after I talked to him.  He wanted a quick fix  of his back pain.  He was informed that he is on the right medications but he needs to be patient for the medications to allow them to work in time.  Objective: Vitals:   06/12/22 1805 06/12/22 1935 06/13/22 0255 06/13/22 0914  BP: 114/64 115/70 102/70   Pulse: 73 74 66   Resp: 18 18 14    Temp: 98 F (36.7 C) 98.6 F (37 C) 98.6 F (37 C)   TempSrc:   Oral   SpO2: 96% 98% 93% 90%  Weight:      Height: 5\' 11"  (1.803 m)  Intake/Output Summary (Last 24 hours) at 06/13/2022 0929 Last data filed at 06/13/2022 0447 Gross per 24 hour  Intake 1308.13 ml  Output 1200 ml  Net 108.13 ml   Filed Weights   06/12/22 0847  Weight: 107 kg    Examination:  General exam: Appears agitated and in pain. Respiratory system: Clear to auscultation. Respiratory effort normal. Cardiovascular system: S1 & S2 heard, RRR. No JVD, murmurs, rubs, gallops or clicks. No pedal edema. Gastrointestinal system: Abdomen is nondistended, soft and nontender. No organomegaly or masses felt. Normal bowel sounds heard. Central nervous system: Alert and oriented. No focal neurological deficits. Extremities: Symmetric 5 x 5 power. Skin: No rashes, lesions or ulcers   Data Reviewed: I have personally reviewed following labs and imaging studies  CBC: Recent Labs  Lab 06/12/22 0855 06/12/22 0942 06/13/22 0445  WBC 13.0*  --  11.7*  NEUTROABS 8.6*  --   --   HGB 16.3 16.7 14.5  HCT 49.1 49.0 44.7  MCV 97.4  --  98.9  PLT 236  --  99991111   Basic Metabolic Panel: Recent Labs  Lab 06/12/22 0855 06/12/22 0942 06/13/22 0445  NA 134* 137 134*  K 4.3 4.5 4.1  CL 99 100 101  CO2 23  --  24  GLUCOSE 95 90 84  BUN 37* 38* 26*  CREATININE 1.75* 1.80* 1.46*  CALCIUM 9.2  --  8.7*  MG 2.1  --   --    GFR: Estimated Creatinine Clearance: 61.9 mL/min (A) (by C-G formula based on SCr of 1.46 mg/dL (H)). Liver Function Tests: Recent Labs  Lab 06/12/22 0855  AST 23  ALT 30  ALKPHOS 71  BILITOT 0.9  PROT  7.6  ALBUMIN 4.0   Recent Labs  Lab 06/12/22 0855  LIPASE 35   No results for input(s): "AMMONIA" in the last 168 hours. Coagulation Profile: No results for input(s): "INR", "PROTIME" in the last 168 hours. Cardiac Enzymes: No results for input(s): "CKTOTAL", "CKMB", "CKMBINDEX", "TROPONINI" in the last 168 hours. BNP (last 3 results) No results for input(s): "PROBNP" in the last 8760 hours. HbA1C: No results for input(s): "HGBA1C" in the last 72 hours. CBG: No results for input(s): "GLUCAP" in the last 168 hours. Lipid Profile: No results for input(s): "CHOL", "HDL", "LDLCALC", "TRIG", "CHOLHDL", "LDLDIRECT" in the last 72 hours. Thyroid Function Tests: No results for input(s): "TSH", "T4TOTAL", "FREET4", "T3FREE", "THYROIDAB" in the last 72 hours. Anemia Panel: No results for input(s): "VITAMINB12", "FOLATE", "FERRITIN", "TIBC", "IRON", "RETICCTPCT" in the last 72 hours. Sepsis Labs: No results for input(s): "PROCALCITON", "LATICACIDVEN" in the last 168 hours.  No results found for this or any previous visit (from the past 240 hour(s)).   Radiology Studies: CT Angio Chest/Abd/Pel for Dissection W and/or Wo Contrast  Addendum Date: 06/12/2022   ADDENDUM REPORT: 06/12/2022 11:01 ADDENDUM: I performed limited review of study to assess for dictation error since dictating radiologist not available. "Stranding about a diverticulum in the mid sigmoid colon" mentioned in findings but not in impression. Corrected as follows: Findings are concerning for early acute diverticulitis. Discussed 06/12/2022 at 10:59 am to provider [facility] [place] . Electronically Signed   By: Yetta Glassman M.D.   On: 06/12/2022 11:01   Result Date: 06/12/2022 CLINICAL DATA:  Onset low back pain when the patient woke up this morning. EXAM: CT ANGIOGRAPHY CHEST, ABDOMEN AND PELVIS TECHNIQUE: Non-contrast CT of the chest was initially obtained. Multidetector CT imaging through the chest, abdomen and pelvis was performed  using the standard protocol during bolus administration of intravenous contrast. Multiplanar reconstructed images and MIPs were obtained and reviewed to evaluate the vascular anatomy. RADIATION DOSE REDUCTION: This exam was performed according to the departmental dose-optimization program which includes automated exposure control, adjustment of the mA and/or kV according to patient size and/or use of iterative reconstruction technique. CONTRAST:  75 mL OMNIPAQUE IOHEXOL 350 MG/ML SOLN COMPARISON:  CT chest 08/23/2021. FINDINGS: CTA CHEST FINDINGS Cardiovascular: Preferential opacification of the thoracic aorta. Negative for aortic dissection. The ascending thoracic aorta measures approximately 4.1 cm, unchanged. Normal heart size. No pericardial effusion. Extensive calcific aortic and coronary atherosclerosis noted. The patient is status post CABG. Mediastinum/Nodes: No enlarged mediastinal, hilar, or axillary lymph nodes. Thyroid gland, trachea, and esophagus demonstrate no significant findings. Lungs/Pleura: No pleural effusion. Emphysematous disease again seen. There is some dependent atelectatic change. Airways negative. Musculoskeletal: No acute or focal abnormality. Review of the MIP images confirms the above findings. CTA ABDOMEN AND PELVIS FINDINGS VASCULAR Aorta: Normal caliber aorta without aneurysm, dissection, vasculitis or significant stenosis. Celiac: Patent without evidence of aneurysm, dissection, vasculitis or significant stenosis. SMA: Patent without evidence of aneurysm, dissection, vasculitis or significant stenosis. Renals: Both renal arteries are patent without evidence of aneurysm, dissection, vasculitis, fibromuscular dysplasia or significant stenosis. Accessory renal arteries to the superior and inferior poles of the right kidney are noted. IMA: Patent without evidence of aneurysm, dissection, vasculitis or significant stenosis. Inflow: Patent without evidence of aneurysm, dissection,  vasculitis or significant stenosis. Veins: No obvious venous abnormality within the limitations of this arterial phase study. Review of the MIP images confirms the above findings. NON-VASCULAR Hepatobiliary: No focal liver abnormality is seen. No gallstones, gallbladder wall thickening, or biliary dilatation. Pancreas: Unremarkable. No pancreatic ductal dilatation or surrounding inflammatory changes. Spleen: Normal in size without focal abnormality. Adrenals/Urinary Tract: Adrenal glands are unremarkable. Kidneys are normal, without renal calculi, solid focal lesion, or hydronephrosis. Bladder is unremarkable. Small bilateral renal cysts noted. Stomach/Bowel: The patient has sigmoid diverticulosis. There is stranding about a diverticulum in the mid sigmoid colon. No abscess or perforation. The colon is otherwise unremarkable. The stomach and small bowel appear normal. No evidence of appendicitis. Lymphatic: No lymphadenopathy. Reproductive: Prostate is unremarkable. Other: Small bilateral fat containing inguinal hernias are seen. Musculoskeletal: No acute or focal abnormality. Healed right intertrochanteric fracture with fixation hardware in place noted. Review of the MIP images confirms the above findings. IMPRESSION: Negative for aortic dissection. Findings consistent with sigmoid diverticulosis without diverticulitis. No change in a 4.1 cm ascending thoracic aortic aneurysm Recommend annual imaging followup by CTA or MRA. This recommendation follows 2010 ACCF/AHA/AATS/ACR/ASA/SCA/SCAI/SIR/STS/SVM Guidelines for the Diagnosis and Management of Patients with Thoracic Aortic Disease. Circulation. 2010; [id number]. Aortic aneurysm NOS (ICD10-I71.9). Electronically Signed: By: Inge Rise M.D. On: 06/12/2022 10:39    Scheduled Meds:  amiodarone  200 mg Oral Daily   apixaban  5 mg Oral BID   atorvastatin  80 mg Oral Daily   busPIRone  10 mg Oral BID   clopidogrel  75 mg Oral Daily   folic acid  1 mg  Oral Daily   furosemide  40 mg Oral Daily   metoprolol succinate  50 mg Oral Daily   mometasone-formoterol  2 puff Inhalation BID   And   umeclidinium bromide  1 puff Inhalation Daily   multivitamin with minerals  1 tablet Oral Daily   pantoprazole  40 mg Oral BID   rOPINIRole  2 mg Oral QHS  thiamine  100 mg Oral Daily   Or   thiamine  100 mg Intravenous Daily   venlafaxine XR  150 mg Oral Q breakfast   Continuous Infusions:  cefTRIAXone (ROCEPHIN)  IV     metronidazole Stopped (06/13/22 0345)     LOS: 0 days   Darliss Cheney, MD [facility]  06/13/2022, 9:29 AM   *Please note that this is a verbal dictation therefore any spelling or grammatical errors are due to the "[place] [employer]" system interpretation.  Please page via [place] and do not message via secure chat for urgent patient care matters. Secure chat can be used for non urgent patient care matters.  How to contact the [facility] Attending or Consulting provider [place] or covering provider during after hours [place], for this patient?  Check the care team in [facility] and look for a) attending/consulting [facility] provider listed and b) the [facility] team listed. Page or secure chat [phone]. Log into [email or web address] and use [place]'s universal password to access. If you do not have the password, please contact the hospital operator. Locate the [facility] provider you are looking for under [facility] and page to a number that you can be directly reached. If you still have difficulty reaching the provider, please page the [facility] (Director on Call) for the Hospitalists listed on amion for assistance.

## 2022-06-14 ENCOUNTER — Inpatient Hospital Stay (HOSPITAL_COMMUNITY): Payer: Medicare HMO

## 2022-06-14 DIAGNOSIS — K5792 Diverticulitis of intestine, part unspecified, without perforation or abscess without bleeding: Secondary | ICD-10-CM | POA: Diagnosis not present

## 2022-06-14 MED ORDER — SODIUM CHLORIDE 0.9 % IV SOLN: INTRAVENOUS | Status: AC

## 2022-06-14 MED ORDER — OXYCODONE HCL 5 MG PO TABS
5.0000 mg | ORAL_TABLET | ORAL | Status: DC | PRN
  Administered 2022-06-14 – 2022-06-16 (×6): 5 mg via ORAL
  Filled 2022-06-14 (×6): qty 1

## 2022-06-14 MED ORDER — POLYETHYLENE GLYCOL 3350 17 G PO PACK
17.0000 g | PACK | Freq: Two times a day (BID) | ORAL | Status: DC
  Administered 2022-06-14 – 2022-06-16 (×5): 17 g via ORAL
  Filled 2022-06-14 (×5): qty 1

## 2022-06-14 MED ORDER — TAMSULOSIN HCL 0.4 MG PO CAPS
0.4000 mg | ORAL_CAPSULE | Freq: Every day | ORAL | Status: DC
  Administered 2022-06-14 – 2022-06-15 (×2): 0.4 mg via ORAL
  Filled 2022-06-14 (×2): qty 1

## 2022-06-14 MED ORDER — METHOCARBAMOL 500 MG PO TABS
500.0000 mg | ORAL_TABLET | Freq: Four times a day (QID) | ORAL | Status: DC
  Administered 2022-06-14 – 2022-06-16 (×8): 500 mg via ORAL
  Filled 2022-06-14 (×8): qty 1

## 2022-06-14 MED ORDER — NICOTINE 21 MG/24HR TD PT24
21.0000 mg | MEDICATED_PATCH | Freq: Every day | TRANSDERMAL | Status: DC
  Filled 2022-06-14 (×2): qty 1

## 2022-06-14 NOTE — Progress Notes (Addendum)
PROGRESS NOTE    Kevin Smith  [id number] DOB: [birth year] DOA: 06/12/2022 PCP: [name], FNP   Brief Narrative:  HPI: Kevin Smith is a 67 y.o. male with medical history significant for COPD, STEMI, Atria Fib, alcohol and tobacco abuse, systolic CHF. Patient presented to the ED with complaints of low back pain that started this morning.  Denies history of back pains.  Reports abdominal discomfort but without real pain over the past few days.  Reports nausea and dry heaving without actual vomiting.  Last bowel movement was yesterday.  No blood in stool.  No fever.  Reports some pain with urination over the past few days, that was not with Foley placement today in the ED.  He is on Lasix and otherwise has not had any problems with voiding.    ED Course: Stable vitals.  CT Angio-chest abdomen and pelvis- negative for dissection, with findings suggestive of early acute diverticulitis.  UA- Not suggestive of infection.  Per ED provider, patient has been complaining of not being able to void since yesterday, bladder scan showed 500 mL of urine, Foley was placed with drainage of 500 mL of urine.  1 L bolus given.  Patient's pain was uncontrolled despite initial 100 mg of fentanyl and later 4 mg of morphine.  IV ceftriaxone and metronidazole started.  Hospitalization was requested.   Assessment & Plan:   Principal Problem:   Diverticulitis Active Problems:   Systolic CHF, chronic   Primary hypertension   COPD GOLD 2    Alcohol abuse   S/P CABG x 3   Cigarette smoker   Anxiety   Paroxysmal atrial fibrillation  1)Acute sigmoid diverticulitis:  -Patient with mostly back pain and CT finding of early sigmoid diverticulitis please see radiologist addendum to CT report -  Continue  Rocephin and Flagyl. = Oral intake is not great due to nausea -Will advance diet to full liquids  2) COPD Gold 2/tobacco abuse--no significant exacerbation at this time hold off on steroids -2 pack-a-day  smoker, declines smoking cessation advised -Nicotine patch as ordered -continue bronchodilators   3)Systolic CHF, chronic Stable and compensated.  Reports compliance with Lasix 40 mg daily.  Last echo 02/2022 EF of 30 to 35% - hold Lasix and Jardiance due to  AKI concerns - 4) acute hypoxic respiratory failure--- multifactorial suspect due to #2 #3 above -Repeat chest x-ray in a.m. -Currently requiring 2 L of oxygen via nasal cannula  5)HTN -Stable, continue metoprolol. -Lasix on hold   6)Alcohol abuse- risk for DTs  - Patient admits to heavy alcohol use -Continue multivitamin, thiamine or folic acid -Continue lorazepam per CIWA protocol   7)Paroxysmal atrial fibrillation Currently in sinus rhythm.  Rate controlled, continue Eliquis, metoprolol and amiodarone -Metoprolol reduced to 50 mg twice daily, from 75 mg twice daily due to bradycardia - 8)Anxiety Continue buspirone, venlafaxine   9)CABG/S/P CABG x 3 Stable.  No chest pain.  EKG without significant change from prior.  Troponin 11  x 2. Continue Eliquis, Plavix, atorvastatin and metoprolol.  9)Back pain: Patient is mainly complaining of back pain, that was his main reason to come to the emergency department.   -Lumbar x-rays shows Lumbar spondylosis, more severe at L5-S1level. 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  .   Estimated body mass index is 32.92 kg/m  as calculated from the following:   Height as of this encounter: 5\' 11"  (1.803 m).   Weight as of this encounter: 107 kg.   Nutritional Assessment: Body mass index is 32.92 kg/m.Marland Kitchen Seen by dietician.  I agree with the assessment and plan as outlined below: Nutrition Status:  . Skin Assessment: I have examined the patient's skin and I agree with the wound assessment as performed by the wound care RN as outlined below:   Consultants:  None  Procedures:  None  Antimicrobials:  Anti-infectives (From admission, onward)    Start     Dose/Rate Route Frequency Ordered Stop   06/13/22 1500  cefTRIAXone (ROCEPHIN) 2 g in sodium chloride 0.9 % 100 mL IVPB        2 g 200 mL/hr over 30 Minutes Intravenous Every 24 hours 06/12/22 1831     06/13/22 0300  metroNIDAZOLE (FLAGYL) IVPB 500 mg        500 mg 100 mL/hr over 60 Minutes Intravenous Every 12 hours 06/12/22 1831     06/12/22 1500  cefTRIAXone (ROCEPHIN) 2 g in sodium chloride 0.9 % 100 mL IVPB       See Hyperspace for full Linked Orders Report.   2 g 200 mL/hr over 30 Minutes Intravenous  Once 06/12/22 1455 06/12/22 1723   06/12/22 1500  metroNIDAZOLE (FLAGYL) IVPB 500 mg       See Hyperspace for full Linked Orders Report.   500 mg 100 mL/hr over 60 Minutes Intravenous  Once 06/12/22 1455 06/12/22 1723         Subjective: -Back discomfort persist, no CVA area discomfort-No dysuria or hematuria -Foley in situ, no urinary concerns per se -Has nausea but no emesis No fever  Or chills   Objective: Vitals:   06/14/22 0338 06/14/22 0754 06/14/22 0836 06/14/22 1100  BP: 114/63  (!) 127/93   Pulse: 63  60   Resp: 18     Temp: (!) 96.9 F (36.1 C)     TempSrc:      SpO2: 95% 93%  94%  Weight:      Height:        Intake/Output Summary (Last 24 hours) at 06/14/2022 1200 Last data filed at 06/14/2022 0542 Gross per 24 hour  Intake 1401.24 ml  Output 4225 ml  Net -2823.76 ml   Filed Weights   06/12/22 0847  Weight: 107  kg   Physical Exam  Gen:- Awake Alert, in no acute distress  HEENT:- [place].AT, No sclera icterus Neck-Supple Neck,No JVD,.  Lungs-  CTAB , fair air movement bilaterally  CV- S1, S2 normal, RRR, chronic sternotomy scar Abd-  +ve B.Sounds, Abd Soft, mild abdominal discomfort without rebound or guarding ,   no CVA area tenderness Extremity/Skin:- No  edema,   good pedal pulses  Psych-affect is appropriate, oriented x3 Neuro-no new focal deficits, mild tremors GU-Foley in situ with clear urine MSK-low back pain with positional change, no crepitus or step deformity on palpation  Data Reviewed: I have personally reviewed following labs and imaging studies  CBC: Recent Labs  Lab 06/12/22 0855 06/12/22 0942 06/13/22 0445  WBC 13.0*  --  11.7*  NEUTROABS 8.6*  --   --   HGB 16.3 16.7 14.5  HCT 49.1 49.0 44.7  MCV 97.4  --  98.9  PLT 236  --  99991111   Basic Metabolic Panel: Recent Labs  Lab 06/12/22 0855 06/12/22 0942 06/13/22 0445  NA 134* 137 134*  K 4.3 4.5 4.1  CL 99 100 101  CO2 23  --  24  GLUCOSE 95 90 84  BUN 37* 38* 26*  CREATININE 1.75* 1.80* 1.46*  CALCIUM 9.2  --  8.7*  MG 2.1  --   --    GFR: Estimated Creatinine Clearance: 61.9 mL/min (A) (by C-G formula based on SCr of 1.46 mg/dL (H)). Liver Function Tests: Recent Labs  Lab 06/12/22 0855  AST 23  ALT 30  ALKPHOS 71  BILITOT 0.9  PROT 7.6  ALBUMIN 4.0   Recent Labs  Lab 06/12/22 0855  LIPASE 35   Scheduled Meds:  amiodarone  200 mg Oral Daily   apixaban  5 mg Oral BID   atorvastatin  80 mg Oral Daily   busPIRone  10 mg Oral BID   Chlorhexidine Gluconate Cloth  6 each Topical Daily   clopidogrel  75 mg Oral Daily   folic acid  1 mg Oral Daily   metoprolol succinate  50 mg Oral Daily   mometasone-formoterol  2 puff Inhalation BID   And   umeclidinium bromide  1 puff Inhalation Daily   multivitamin with minerals  1 tablet Oral Daily   pantoprazole  40 mg Oral BID   polyethylene glycol  17 g  Oral BID   rOPINIRole  2 mg Oral QHS   thiamine  100 mg Oral Daily   Or   thiamine  100 mg Intravenous Daily   venlafaxine XR  150 mg Oral Q breakfast   Continuous Infusions:  sodium chloride     cefTRIAXone (ROCEPHIN)  IV Stopped (06/13/22 1446)   metronidazole Stopped (06/14/22 0342)     LOS: 1 day   Roxan Hockey, MD [facility]  06/14/2022, 12:00 PM

## 2022-06-14 NOTE — Progress Notes (Signed)
SATURATION QUALIFICATIONS: (This note is used to comply with regulatory documentation for home oxygen)  Patient Saturations on Room Air at Rest = 94%  Patient Saturations on Room Air while Ambulating = 82%  Patient Saturations on 2 Liters of oxygen while Ambulating = 93%  Please briefly explain why patient needs home oxygen: Patient got SOB with ambulation on RA. Patient's o2 saturation was also 82% on RA with ambulation.

## 2022-06-14 NOTE — Progress Notes (Signed)
Mobility Specialist Progress Note:    06/14/22 1325  Mobility  Activity Transferred from chair to bed  Level of Assistance Contact guard assist, steadying assist  Assistive Device None  Distance Ambulated (ft) 3 ft  Activity Response Tolerated well  Mobility Referral Yes  $Mobility charge 1 Mobility   Pt requested assistance to transfer C>B. CGA required for safety, pt slightly unbalanced. Tolerated well, c/o back and headache pain (8/10). All  needs met, call bell in reach.   Royetta Crochet Mobility Specialist Please contact via Solicitor or  Rehab office at [phone]

## 2022-06-14 NOTE — Discharge Instructions (Addendum)
1)Repeat BMP blood test with PCP within a week 2)Please follow-up with PCP within a week after completing Augmentin antibiotic for recheck bowel infection and back pain 3)Complete abstinence from alcohol and tobacco strongly recommended 4)You are taking Eliquis/Apixaban and Plavix/Clopidogrel which are blood Thinners  so please Avoid ibuprofen/Advil/Aleve/Motrin/Goody Powders/Naproxen/BC powders/Meloxicam/Diclofenac/Indomethacin and other Nonsteroidal anti-inflammatory medications as these will make you more likely to bleed and can cause stomach ulcers, can also cause Kidney problems.   Food Agency Name: [facility] Address: [address], [place], [place] [address] Phone: [phone] Website: www.adtsrc.org Services Offered: Meals on [employer] and Meals with Friends.   Home care, at home assisted living, volunteer services, Center  for Active Retirement, transportation  Agency Name: [facility] Address: Sites vary. Must call first. Food Pantry location: [address] [place], [place] [address] [employer] Phone: [phone] Website: none Services Offered: Museum/gallery curator, utility assistance if funds available Careers adviser for all of [place], [employer], [employer], Office manager and [employer] for [employer] area only) Walk-in  current Id and current address verification required.  Wed-Thurs: 9:30-12:00 Agency Name: [facility] Address: [address] [place], [place] [address]  Phone: [phone] or [phone] Website: none Services Offered: Food assistance Agency Name: [employer] [facility] Address: [address] [place], [place] [address]  Phone: [phone] or [phone]  Website: none Services Offered: Serves 1 hot meal a day at 11:00 am Monday-Sunday and 5 pm  on the second and fourth Sunday of each month  Agency Name: [facility] Department of  Health and [facility]  Services/Social Services  Address: [phone], [place], [place] [address] Phone: [phone] Website: [email or web address]  [email or web address] Services Offered: Sales executive, [facility] program Agency Name: [employer] Address: [address] [place], [place] [address] Phone: [phone] Website: [email or web address] Services Offered: Food pantry Tuesday, Wednesday and Thursday 9am-11:30am  (need appointment) and health clinic (9:00am-11:00am)  Agency Name: [employer] [facility] Address: [address], [place] / [address] [place] Phone: [phone] [place] / [phone] [place] Website: OpinionTrades.tn NetworkAffair.co.za Services Offered: Civil Service fast streamer, food pantry, soup kitchen ([place]) emergency financial  assistance, thrift stores, showers & hygiene products ([place]),  Christmas Assistance, spiritual help

## 2022-06-14 NOTE — TOC Initial Note (Signed)
Transition of Care [facility]) - Initial/Assessment Note    Patient Details  Name: Kevin Smith MRN: [id number] Date of Birth: 02-20-1956  Transition of Care [facility]) CM/SW Contact:    Boneta Lucks, RN Phone Number: 06/14/2022, 11:18 AM  Clinical Narrative:     Patient admitted with diverticulitis. Patient is a high risk for readmission. CM spoke with patient, he states he lives alone, his daughter comes and goes, he can not depend on her. He uses [facility] to get to appointments. He is concerned with meals. He is agreeable to [facility] home health. He has a new foley and being assess for home oxygen. Marjory Lies accepted the referral. MD aware to order HHRN/SW/Aid.   Social work can assist with more community resources and possibly meals on wheels.  Meal resources added to AVS.               Expected Discharge Plan: [place] Barriers to Discharge: Continued Medical Work up   Patient Goals and CMS Choice Patient states their goals for this hospitalization and ongoing recovery are:: to go home. CMS Medicare.gov Compare Post Acute Care list provided to:: Patient Choice offered to / list presented to : Patient      Expected Discharge Plan and Services       Living arrangements for the past 2 months: Apartment                    HH Arranged: RN [place] Agency: [place] Date [place]: 06/14/22 Time [place]: 21 Representative spoke with at [place]: Marjory Lies  Prior Living Arrangements/Services Living arrangements for the past 2 months: Apartment Lives with:: Self Patient language and need for interpreter reviewed:: Yes Do you feel safe going back to the place where you live?: Yes      Need for Family Participation in Patient Care: Yes (Comment) Care giver support system in place?: Yes (comment) Current home services: DME Criminal Activity/Legal Involvement Pertinent to Current Situation/Hospitalization: No - Comment as needed  Activities of  Daily Living Home Assistive Devices/Equipment: Cane (specify quad or straight), Dentures (specify type) ADL Screening (condition at time of admission) Patient's cognitive ability adequate to safely complete daily activities?: Yes Is the patient deaf or have difficulty hearing?: No Does the patient have difficulty seeing, even when wearing glasses/contacts?: No Does the patient have difficulty concentrating, remembering, or making decisions?: No Patient able to express need for assistance with ADLs?: Yes Does the patient have difficulty dressing or bathing?: No Independently performs ADLs?: Yes (appropriate for developmental age) Does the patient have difficulty walking or climbing stairs?: Yes Weakness of Legs: Both Weakness of Arms/Hands: None  Permission Sought/Granted        Emotional Assessment     Affect (typically observed): Accepting Orientation: : Oriented to Self, Oriented to Place, Oriented to  Time, Oriented to Situation Alcohol / Substance Use: Not Applicable Psych Involvement: No (comment)  Admission diagnosis:  Urinary retention [R33.9] Diverticulitis [K57.92] Elevated serum creatinine [R79.89] Diverticulitis large intestine w/o perforation or abscess w/o bleeding [K57.32] Patient Active Problem List   Diagnosis Date Noted   Diverticulitis 06/12/2022   GAD (generalized anxiety disorder) 05/05/2022   COPD with chronic bronchitis and emphysema 04/10/2022   HFrEF (heart failure with reduced ejection fraction) 04/10/2022   Paroxysmal atrial fibrillation 03/07/2022   Influenza A 03/07/2022   Anxiety 01/23/2022   Alcohol abuse 0000000   Systolic CHF, chronic 0000000   Acute kidney injury superimposed on chronic  kidney disease 12/31/2021   Class 1 obesity 12/31/2021   NSTEMI (non-ST elevated myocardial infarction) 12/27/2021   Primary insomnia 11/09/2021   Restless legs 11/09/2021   Cigarette smoker 08/18/2021   Upper airway cough syndrome 08/18/2021    Cervical radiculopathy 03/21/2021   History of ST elevation myocardial infarction (STEMI) 12/22/2020   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 09/08/2020   Macrocytosis 03/31/2020   Gastroesophageal reflux disease 01/02/2020   Erectile dysfunction 01/02/2020   Coronary artery disease involving 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 03/12/2017   COPD GOLD 2  11/22/2015   Nicotine dependence, uncomplicated [id number]   Aortic aneurysm 2017   Primary hypertension 01/04/2015   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   Hyperlipidemia with target LDL less than 70 12/07/2012   DOE (dyspnea on exertion) 05/18/2010   PCP:  [name], FNP Pharmacy:   [facility] [address] [place] [address] Phone: [phone] Fax: [phone]    Social Determinants of Health (SDOH) Social History: SDOH Screenings   Food Insecurity: No Food Insecurity (06/12/2022)  Housing: Low Risk  (06/12/2022)  Transportation Needs: No Transportation Needs (06/12/2022)  Recent Concern: Transportation Needs - Unmet Transportation Needs (04/14/2022)  Utilities: Not At Risk (06/12/2022)  Alcohol Screen: Medium Risk (11/25/2020)  Depression (PHQ2-9): High Risk (05/24/2022)  Financial Resource Strain: Medium Risk (04/28/2022)  Physical Activity: Insufficiently Active (11/25/2020)  Social Connections: Socially Isolated (11/25/2020)  Stress: No Stress Concern Present (11/25/2020)  Tobacco Use: High Risk (06/12/2022)   SDOH Interventions:  Readmission Risk Interventions    06/14/2022   11:07 AM 03/07/2022   12:41 PM  Readmission Risk Prevention Plan  Transportation Screening Complete Complete  PCP or Specialist Appt within 3-5 Days Not Complete   Home Care Screening  Complete  Medication Review (RN CM)  Complete  HRI or Home Care Consult Complete   Social Work Consult for [place] Planning/Counseling Complete   Palliative Care  Screening Not Applicable   Medication Review Press photographer) Complete

## 2022-06-15 ENCOUNTER — Telehealth: Payer: Medicare HMO

## 2022-06-15 ENCOUNTER — Inpatient Hospital Stay (HOSPITAL_COMMUNITY): Payer: Medicare HMO

## 2022-06-15 DIAGNOSIS — K5792 Diverticulitis of intestine, part unspecified, without perforation or abscess without bleeding: Secondary | ICD-10-CM | POA: Diagnosis not present

## 2022-06-15 LAB — BASIC METABOLIC PANEL
Anion gap: 12 (ref 5–15)
BUN: 22 mg/dL (ref 8–23)
CO2: 23 mmol/L (ref 22–32)
Calcium: 8.4 mg/dL — ABNORMAL LOW (ref 8.9–10.3)
Chloride: 96 mmol/L — ABNORMAL LOW (ref 98–111)
Creatinine, Ser: 1.51 mg/dL — ABNORMAL HIGH (ref 0.61–1.24)
GFR, Estimated: 51 mL/min — ABNORMAL LOW (ref >60)
Glucose, Bld: 93 mg/dL (ref 70–99)
Potassium: 4.1 mmol/L (ref 3.5–5.1)
Sodium: 131 mmol/L — ABNORMAL LOW (ref 135–145)

## 2022-06-15 MED ORDER — FUROSEMIDE 10 MG/ML IJ SOLN
20.0000 mg | Freq: Once | INTRAMUSCULAR | Status: AC
  Administered 2022-06-15: 20 mg via INTRAVENOUS
  Filled 2022-06-15: qty 2

## 2022-06-15 MED ORDER — FUROSEMIDE 40 MG PO TABS
40.0000 mg | ORAL_TABLET | Freq: Every day | ORAL | Status: DC
  Administered 2022-06-16: 40 mg via ORAL
  Filled 2022-06-15: qty 1

## 2022-06-15 MED ORDER — SENNOSIDES-DOCUSATE SODIUM 8.6-50 MG PO TABS
2.0000 | ORAL_TABLET | Freq: Every day | ORAL | Status: DC
  Administered 2022-06-15: 2 via ORAL
  Filled 2022-06-15: qty 2

## 2022-06-15 NOTE — Evaluation (Signed)
Physical Therapy Evaluation Patient Details Name: Kevin Smith MRN: [id number] DOB: [birth year] Today's Date: 06/15/2022  History of Present Illness  Kevin Smith is a 67 y.o. male with medical history significant for COPD, STEMI, Atria Fib, alcohol and tobacco abuse, systolic CHF.  Patient presented to the ED with complaints of low back pain that started this morning.  Denies history of back pains.  Reports abdominal discomfort but without real pain over the past few days.  Reports nausea and dry heaving without actual vomiting.  Last bowel movement was yesterday.  No blood in stool.  No fever.  Reports some pain with urination over the past few days, that was not with Foley placement today in the ED.  He is on Lasix and otherwise has not had any problems with voiding.  Clinical Impression    Pt is a 67 yo male presenting [facility] due to reason stated in the HPI.   Pt tolerated today's Physical Therapy Evaluation, pt operating at small decline from functional baseline prior to hospitalization, on room air, pt @ 95%, desaturating to 85% with mobility, pt reporting mild weakness, patient given 2 L of O2 tolerated remainder of mobility well. Pt performed independent with cueing w/ bed mobility, supervision level with RW w/ functional transfers, and 75 feet with rolling walker at supervision level with 1-2 L O2 for gait/ambulation.   Based upon objective information, pt's functional deficits and impairments included reduced functional activity tolerance and mobility secondary to deconditioning reduced oxygen saturation. Based upon these deficits/impairments, pt would benefit from skilled acute physical therapy services to address the above deficits and improve their functional status.  PT recommends pt discharge home with home health PT services in order to improve pt's functional status, safety and independence with functional mobility and overall QOL.            Recommendations for follow up therapy are one component of a multi-disciplinary discharge planning process, led by the attending physician.  Recommendations may be updated based on patient status, additional functional criteria and insurance authorization.  Follow Up Recommendations       Assistance Recommended at Discharge Set up Supervision/Assistance  Patient can return home with the following  A little help with bathing/dressing/bathroom;Assistance with cooking/housework    Equipment Recommendations    Recommendations for Other Services       Functional Status Assessment Patient has had a recent decline in their functional status and demonstrates the ability to make significant improvements in function in a reasonable and predictable amount of time.     Precautions / Restrictions Precautions Precautions: None Restrictions Weight Bearing Restrictions: No      Mobility  Bed Mobility Overal bed mobility: Needs Assistance Bed Mobility: Supine to Sit     Supine to sit: Modified independent (Device/Increase time), Supervision     General bed mobility comments: Modified independent with the elevated, use of bed rails.  Given cueing for proper movement patterns and reducing impulsivity. Patient Response: Cooperative  Transfers Overall transfer level: Needs assistance   Transfers: Sit to/from Stand Sit to Stand: Supervision, Modified independent (Device/Increase time)           General transfer comment: Supervision level with RW constant cueing provided for sequencing, steadiness, and increased impulsivity.    Ambulation/Gait Ambulation/Gait assistance: Modified independent (Device/Increase time) Gait Distance (Feet): 75 Feet Assistive device: Rolling walker (2 wheels) Gait Pattern/deviations: Step-through pattern, Decreased step length - right, Decreased step length - left, Shuffle, Narrow base of  support       General Gait Details: Slow steady ambulation,  with no loss of balance is noted.  Use of 1L of the 2 ambulating.  Limited talking, increased work of breathing noted.  Patient able to make out a couple words intermittently.  Standing rest break when turning around.  Stairs            Wheelchair Mobility    Modified Rankin (Stroke Patients Only)       Balance Overall balance assessment: Modified Independent, Needs assistance Sitting-balance support: No upper extremity supported Sitting balance-Leahy Scale: Good Sitting balance - Comments: Good/good for sitting balance lateral and A/P adjustments independently.   Standing balance support: Bilateral upper extremity supported Standing balance-Leahy Scale: Good Standing balance comment: Good/good with use of RW.  No loss of balance noted, steady standing.                             Pertinent Vitals/Pain Pain Assessment Pain Assessment: 0-10 Pain Score: 6  Pain Location: Low back; " kind of steady" Pain Descriptors / Indicators: Other (Comment)    Home Living Family/patient expects to be discharged to:: Private residence Living Arrangements: Alone;Children Available Help at Discharge: Friend(s);Family;Available PRN/intermittently Type of Home: Apartment Home Access: Stairs to enter Entrance Stairs-Rails: Left Entrance Stairs-Number of Steps: flight (17)   Home Layout: One level Home Equipment: Cane - quad;Shower seat;Rolling Environmental consultant (2 wheels) Additional Comments: Patient reports that his daughter is still staying with him intermittently and helps provide assistance with cleaning.    Prior Function Prior Level of Function : Independent/Modified Independent             Mobility Comments: ambulates without use of a device; patient reporting that he will use between cane or RW depending upon how he feels ADLs Comments: Patient reports daughter assist with groceries and clean being.  Patient reports not driving. independent with other ADLs.     Hand  Dominance   Dominant Hand: Right    Extremity/Trunk Assessment   Upper Extremity Assessment Upper Extremity Assessment: Overall WFL for tasks assessed    Lower Extremity Assessment Lower Extremity Assessment: Overall WFL for tasks assessed    Cervical / Trunk Assessment Cervical / Trunk Assessment: Kyphotic  Communication   Communication: No difficulties  Cognition Arousal/Alertness: Awake/alert Behavior During Therapy: WFL for tasks assessed/performed, Impulsive Overall Cognitive Status: Within Functional Limits for tasks assessed                                          General Comments      Exercises     Assessment/Plan    PT Assessment Patient needs continued PT services  PT Problem List Decreased activity tolerance;Decreased mobility       PT Treatment Interventions Stair training;Therapeutic activities;Therapeutic exercise;Gait training    PT Goals (Current goals can be found in the Care Plan section)  Acute Rehab PT Goals Patient Stated Goal: "go home" PT Goal Formulation: With patient Time For Goal Achievement: 06/29/22 Potential to Achieve Goals: Good    Frequency Min 2X/week     Co-evaluation               AM-PAC PT "6 Clicks" Mobility  Outcome Measure Help needed turning from your back to your side while in a flat bed without using bedrails?: None Help needed  moving from lying on your back to sitting on the side of a flat bed without using bedrails?: None Help needed moving to and from a bed to a chair (including a wheelchair)?: None Help needed standing up from a chair using your arms (e.g., wheelchair or bedside chair)?: None Help needed to walk in hospital room?: A Little Help needed climbing 3-5 steps with a railing? : A Lot 6 Click Score: 21    End of Session Equipment Utilized During Treatment: Gait belt;Oxygen Activity Tolerance: Patient tolerated treatment well Patient left: in chair;with chair alarm set;with  call bell/phone within reach Nurse Communication: Mobility status PT Visit Diagnosis: Unsteadiness on feet (R26.81);Other abnormalities of gait and mobility (R26.89)    Time: [id number] PT Time Calculation (min) (ACUTE ONLY): 25 min   Charges:   PT Evaluation $PT Eval Low Complexity: 1 Low          Wonda Olds PT, DPT Physical Therapist with [place] [phone] office   Wonda Olds 06/15/2022, 9:35 AM

## 2022-06-15 NOTE — Plan of Care (Signed)
  Problem: Acute Rehab PT Goals(only PT should resolve) Goal: Pt Will Go Supine/Side To Sit Flowsheets (Taken 06/15/2022 0941) Pt will go Supine/Side to Sit: Independently Goal: Patient Will Transfer Sit To/From Stand Flowsheets (Taken 06/15/2022 0941) Patient will transfer sit to/from stand: Independently Goal: Pt Will Ambulate Flowsheets (Taken 06/15/2022 0941) Pt will Ambulate:  > 125 feet  Independently  with least restrictive assistive device Goal: Pt Will Go Up/Down Stairs Flowsheets (Taken 06/15/2022 0941) Pt will Go Up / Down Stairs:  Flight  Independently  with rail(s)  with least restrictive assistive device   Wonda Olds PT, DPT Physical Therapist with [facility] [phone] office

## 2022-06-15 NOTE — Progress Notes (Addendum)
PROGRESS NOTE    Kevin Smith  [id number] DOB: Dec 03, 1955 DOA: 06/12/2022 PCP: [name], FNP   Brief Narrative:  HPI: Kevin Smith is a 67 y.o. male with medical history significant for COPD, STEMI, Atria Fib, alcohol and tobacco abuse, systolic CHF. Patient presented to the ED with complaints of low back pain that started this morning.  Denies history of back pains.  Reports abdominal discomfort but without real pain over the past few days.  Reports nausea and dry heaving without actual vomiting.  Last bowel movement was yesterday.  No blood in stool.  No fever.  Reports some pain with urination over the past few days, that was not with Foley placement today in the ED.  He is on Lasix and otherwise has not had any problems with voiding.    ED Course: Stable vitals.  CT Angio-chest abdomen and pelvis- negative for dissection, with findings suggestive of early acute diverticulitis.  UA- Not suggestive of infection.  Per ED provider, patient has been complaining of not being able to void since yesterday, bladder scan showed 500 mL of urine, Foley was placed with drainage of 500 mL of urine.  1 L bolus given.  Patient's pain was uncontrolled despite initial 100 mg of fentanyl and later 4 mg of morphine.  IV ceftriaxone and metronidazole started.  Hospitalization was requested.   Assessment & Plan:   Principal Problem:   Diverticulitis Active Problems:   Systolic CHF, chronic   Primary hypertension   COPD GOLD 2    Alcohol abuse   S/P CABG x 3   Cigarette smoker   Anxiety   Paroxysmal atrial fibrillation  1)Acute sigmoid diverticulitis:  -Patient with mostly back pain and CT finding of early sigmoid diverticulitis please see radiologist addendum to CT report -  Continue  Rocephin and Flagyl. -06/15/22 -Tolerating soft diet okay  2) COPD Gold 2/tobacco abuse--no significant exacerbation at this time hold off on steroids -2 pack-a-day smoker, declines smoking cessation  advised -Nicotine patch as ordered -continue bronchodilators   3)Systolic CHF, chronic Stable and compensated.  Reports compliance with Lasix 40 mg daily.  Last echo 02/2022 EF of 30 to 35% - hold Jardiance due to  AKI concerns -Okay to restart Lasix - 4) acute hypoxic respiratory failure--- multifactorial suspect due to #2 #3 above 06/15/22 -Repeat chest x-ray on 06/15/22 -suggest CHF -Currently requiring 2 L of oxygen via nasal cannula--- desaturation usually -Lasix restarted on 06/15/2022  5)HTN -Stable, continue metoprolol. -This is restarted   6)Alcohol abuse- risk for DTs  - Patient admits to heavy alcohol use -Continue multivitamin, thiamine or folic acid -Continue lorazepam per CIWA protocol   7)Paroxysmal atrial fibrillation Currently in sinus rhythm.  Rate controlled, continue Eliquis, metoprolol and amiodarone -Metoprolol reduced to 50 mg twice daily, from 75 mg twice daily due to bradycardia - 8)Anxiety Continue buspirone, venlafaxine   9)CABG/S/P CABG x 3 Stable.  No chest pain.  EKG without significant change from prior.  Troponin 11  x 2. Continue Eliquis, Plavix, atorvastatin and metoprolol.  9)Back pain: Patient is mainly complaining of back pain, that was his main reason to come to the emergency department.   -Lumbar x-rays shows Lumbar spondylosis, more severe at L5-S1level. -Methocarbamol and as needed oxycodone as ordered  10)AKI on CKD stage IIIa: Creatinine trending down to 1.46 from 1.80 on admission -Lasix and Jardiance on hold -renally adjust medications, avoid nephrotoxic agents / dehydration  / hypotension  11)Acute  urinary retention/BPH with LUTs--- reports  history of recurrent urinary retention-Foley catheter over a year ago, -During his admission Foley was placed in the ED due to urinary retention 06/15/22 -Continue Flomax -Remove Foley on 06/15/2022 and try voiding trial  12) generalized weakness and deconditioning--- PT eval appreciated  recommends  home health PT  13) constipation--- made worse by opiates and muscle relaxants -Laxatives as ordered  DVT prophylaxis: eliquis   Code Status: Full Code  Family Communication:  None present at bedside.  .   Estimated body mass index is 32.92 kg/m as calculated from the following:   Height as of this encounter: 5\' 11"  (1.803 m).   Weight as of this encounter: 107 kg.   Consultants:  None  Procedures:  None  Antimicrobials:  Anti-infectives (From admission, onward)    Start     Dose/Rate Route Frequency Ordered Stop   06/13/22 1500  cefTRIAXone (ROCEPHIN) 2 g in sodium chloride 0.9 % 100 mL IVPB        2 g 200 mL/hr over 30 Minutes Intravenous Every 24 hours 06/12/22 1831     06/13/22 0300  metroNIDAZOLE (FLAGYL) IVPB 500 mg        500 mg 100 mL/hr over 60 Minutes Intravenous Every 12 hours 06/12/22 1831     06/12/22 1500  cefTRIAXone (ROCEPHIN) 2 g in sodium chloride 0.9 % 100 mL IVPB       See Hyperspace for full Linked Orders Report.   2 g 200 mL/hr over 30 Minutes Intravenous  Once 06/12/22 1455 06/12/22 1723   06/12/22 1500  metroNIDAZOLE (FLAGYL) IVPB 500 mg       See Hyperspace for full Linked Orders Report.   500 mg 100 mL/hr over 60 Minutes Intravenous  Once 06/12/22 1455 06/12/22 1723         Subjective: -- Patient is willing to remove Foley catheter and do voiding trial at this time No fever  Or chills  -Oral intake improving no emesis  Objective: Vitals:   06/15/22 0242 06/15/22 0420 06/15/22 0926 06/15/22 1339  BP: 111/74 113/69  108/75  Pulse: 75 78 69 81  Resp: 20 18 (!) 22   Temp:  97.9 F (36.6 C)    TempSrc:      SpO2:  94%  95%  Weight:      Height:        Intake/Output Summary (Last 24 hours) at 06/15/2022 1635 Last data filed at 06/15/2022 0532 Gross per 24 hour  Intake 720 ml  Output 2450 ml  Net -1730 ml   Filed Weights   06/12/22 0847  Weight: 107 kg   Physical Exam  Gen:- Awake Alert, in no acute distress   HEENT:- [place].AT, No sclera icterus Neck-Supple Neck,No JVD,.  Lungs-  CTAB , fair air movement bilaterally  CV- S1, S2 normal, RRR, chronic sternotomy scar Abd-  +ve B.Sounds, Abd Soft, no significant abdominal pain,,   no CVA area tenderness Extremity/Skin:- No  edema,   good pedal pulses  Psych-affect is appropriate, oriented x3 Neuro-no new focal deficits, mild tremors GU-Foley in situ with clear urine--- removed on 06/15/2022 MSK-low back pain with positional change, no crepitus or step deformity on palpation  Data Reviewed: I have personally reviewed following labs and imaging studies  CBC: Recent Labs  Lab 06/12/22 0855 06/12/22 0942 06/13/22 0445  WBC 13.0*  --  11.7*  NEUTROABS 8.6*  --   --   HGB 16.3 16.7 14.5  HCT 49.1 49.0 44.7  MCV 97.4  --  98.9  PLT 236  --  99991111   Basic Metabolic Panel: Recent Labs  Lab 06/12/22 0855 06/12/22 0942 06/13/22 0445 06/15/22 0411  NA 134* 137 134* 131*  K 4.3 4.5 4.1 4.1  CL 99 100 101 96*  CO2 23  --  24 23  GLUCOSE 95 90 84 93  BUN 37* 38* 26* 22  CREATININE 1.75* 1.80* 1.46* 1.51*  CALCIUM 9.2  --  8.7* 8.4*  MG 2.1  --   --   --    GFR: Estimated Creatinine Clearance: 59.9 mL/min (A) (by C-G formula based on SCr of 1.51 mg/dL (H)). Liver Function Tests: Recent Labs  Lab 06/12/22 0855  AST 23  ALT 30  ALKPHOS 71  BILITOT 0.9  PROT 7.6  ALBUMIN 4.0   Recent Labs  Lab 06/12/22 0855  LIPASE 35   Scheduled Meds:  amiodarone  200 mg Oral Daily   apixaban  5 mg Oral BID   atorvastatin  80 mg Oral Daily   busPIRone  10 mg Oral BID   Chlorhexidine Gluconate Cloth  6 each Topical Daily   clopidogrel  75 mg Oral Daily   folic acid  1 mg Oral Daily   [START ON 06/16/2022] furosemide  40 mg Oral Daily   methocarbamol  500 mg Oral QID   metoprolol succinate  50 mg Oral Daily   mometasone-formoterol  2 puff Inhalation BID   And   umeclidinium bromide  1 puff Inhalation Daily   multivitamin with minerals  1 tablet  Oral Daily   nicotine  21 mg Transdermal Daily   pantoprazole  40 mg Oral BID   polyethylene glycol  17 g Oral BID   rOPINIRole  2 mg Oral QHS   senna-docusate  2 tablet Oral QHS   tamsulosin  0.4 mg Oral QPC supper   thiamine  100 mg Oral Daily   Or   thiamine  100 mg Intravenous Daily   venlafaxine XR  150 mg Oral Q breakfast   Continuous Infusions:  cefTRIAXone (ROCEPHIN)  IV 2 g (06/15/22 1556)   metronidazole 500 mg (06/15/22 1346)     LOS: 2 days   Roxan Hockey, MD [facility]  06/15/2022, 4:35 PM

## 2022-06-15 NOTE — Care Management Important Message (Signed)
Important Message  Patient Details  Name: Tyray Elley MRN: [id number] Date of Birth: August 17, 1955   Medicare Important Message Given:  Yes     Tommy Medal 06/15/2022, 11:27 AM

## 2022-06-16 DIAGNOSIS — K5792 Diverticulitis of intestine, part unspecified, without perforation or abscess without bleeding: Secondary | ICD-10-CM | POA: Diagnosis not present

## 2022-06-16 LAB — RENAL FUNCTION PANEL
Albumin: 3.4 g/dL — ABNORMAL LOW (ref 3.5–5.0)
Anion gap: 11 (ref 5–15)
BUN: 21 mg/dL (ref 8–23)
CO2: 24 mmol/L (ref 22–32)
Calcium: 8.5 mg/dL — ABNORMAL LOW (ref 8.9–10.3)
Chloride: 96 mmol/L — ABNORMAL LOW (ref 98–111)
Creatinine, Ser: 1.4 mg/dL — ABNORMAL HIGH (ref 0.61–1.24)
GFR, Estimated: 55 mL/min — ABNORMAL LOW (ref >60)
Glucose, Bld: 95 mg/dL (ref 70–99)
Phosphorus: 3.8 mg/dL (ref 2.5–4.6)
Potassium: 3.8 mmol/L (ref 3.5–5.1)
Sodium: 131 mmol/L — ABNORMAL LOW (ref 135–145)

## 2022-06-16 LAB — CBC
HCT: 43.2 % (ref 39.0–52.0)
Hemoglobin: 14.2 g/dL (ref 13.0–17.0)
MCH: 32.5 pg (ref 26.0–34.0)
MCHC: 32.9 g/dL (ref 30.0–36.0)
MCV: 98.9 fL (ref 80.0–100.0)
Platelets: 194 10*3/uL (ref 150–400)
RBC: 4.37 MIL/uL (ref 4.22–5.81)
RDW: 14.3 % (ref 11.5–15.5)
WBC: 9.5 10*3/uL (ref 4.0–10.5)
nRBC: 0 % (ref 0.0–0.2)

## 2022-06-16 MED ORDER — ALBUTEROL SULFATE (2.5 MG/3ML) 0.083% IN NEBU: 2.5000 mg | INHALATION_SOLUTION | RESPIRATORY_TRACT | 12 refills | Status: DC | PRN

## 2022-06-16 MED ORDER — APIXABAN 5 MG PO TABS: 5.0000 mg | ORAL_TABLET | Freq: Two times a day (BID) | ORAL | 11 refills | Status: DC

## 2022-06-16 MED ORDER — PREDNISONE 20 MG PO TABS: 40.0000 mg | ORAL_TABLET | Freq: Every day | ORAL | 0 refills | Status: AC

## 2022-06-16 MED ORDER — SENNOSIDES-DOCUSATE SODIUM 8.6-50 MG PO TABS: 2.0000 | ORAL_TABLET | Freq: Every day | ORAL | 3 refills | Status: DC

## 2022-06-16 MED ORDER — BREZTRI AEROSPHERE 160-9-4.8 MCG/ACT IN AERO
2.0000 | INHALATION_SPRAY | Freq: Two times a day (BID) | RESPIRATORY_TRACT | 5 refills | Status: DC
Start: 2022-06-16 — End: 2023-04-26

## 2022-06-16 MED ORDER — TAMSULOSIN HCL 0.4 MG PO CAPS: 0.4000 mg | ORAL_CAPSULE | Freq: Every day | ORAL | 2 refills | Status: DC

## 2022-06-16 MED ORDER — CLOPIDOGREL BISULFATE 75 MG PO TABS: 75.0000 mg | ORAL_TABLET | Freq: Every day | ORAL | 3 refills | Status: DC

## 2022-06-16 MED ORDER — METOPROLOL SUCCINATE ER 50 MG PO TB24: 50.0000 mg | ORAL_TABLET | Freq: Every day | ORAL | 11 refills | Status: DC

## 2022-06-16 MED ORDER — BUSPIRONE HCL 10 MG PO TABS: 10.0000 mg | ORAL_TABLET | Freq: Two times a day (BID) | ORAL | 3 refills | Status: DC

## 2022-06-16 MED ORDER — AMIODARONE HCL 200 MG PO TABS
200.0000 mg | ORAL_TABLET | Freq: Every day | ORAL | 5 refills | Status: DC
Start: 2022-06-16 — End: 2023-02-28

## 2022-06-16 MED ORDER — NICOTINE 21 MG/24HR TD PT24: 21.0000 mg | MEDICATED_PATCH | Freq: Every day | TRANSDERMAL | 0 refills | Status: DC

## 2022-06-16 MED ORDER — AMOXICILLIN-POT CLAVULANATE 875-125 MG PO TABS: 1.0000 | ORAL_TABLET | Freq: Two times a day (BID) | ORAL | 0 refills | Status: AC

## 2022-06-16 MED ORDER — OXYCODONE HCL 5 MG PO TABS: 5.0000 mg | ORAL_TABLET | ORAL | 0 refills | Status: DC | PRN

## 2022-06-16 MED ORDER — FUROSEMIDE 40 MG PO TABS: 40.0000 mg | ORAL_TABLET | Freq: Every day | ORAL | 5 refills | Status: DC

## 2022-06-16 MED ORDER — EZETIMIBE 10 MG PO TABS
10.0000 mg | ORAL_TABLET | Freq: Every day | ORAL | 2 refills | Status: DC
Start: 2022-06-16 — End: 2023-02-28

## 2022-06-16 MED ORDER — EMPAGLIFLOZIN 10 MG PO TABS: 10.0000 mg | ORAL_TABLET | Freq: Every day | ORAL | 6 refills | Status: DC

## 2022-06-16 MED ORDER — METHOCARBAMOL 750 MG PO TABS: 750.0000 mg | ORAL_TABLET | Freq: Three times a day (TID) | ORAL | 1 refills | Status: DC

## 2022-06-16 MED ORDER — FOLIC ACID 1 MG PO TABS: 1.0000 mg | ORAL_TABLET | Freq: Every day | ORAL | 5 refills | Status: DC

## 2022-06-16 MED ORDER — DESVENLAFAXINE SUCCINATE ER 100 MG PO TB24: 100.0000 mg | ORAL_TABLET | Freq: Every day | ORAL | 3 refills | Status: DC

## 2022-06-16 MED ORDER — PANTOPRAZOLE SODIUM 40 MG PO TBEC
40.0000 mg | DELAYED_RELEASE_TABLET | Freq: Two times a day (BID) | ORAL | 3 refills | Status: DC
Start: 2022-06-16 — End: 2023-02-28
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
## 2022-06-16 NOTE — TOC Transition Note (Signed)
Transition of Care [facility]) - CM/SW Discharge Note   Patient Details  Name: Kevin Smith MRN: [id number] Date of Birth: 11/09/1955  Transition of Care [facility]) CM/SW Contact:  Villa Herb, LCSWA Phone Number: 06/16/2022, 11:41 AM  Clinical Narrative:    CSW updated pt is medically ready for D/C home today. CSW updated Clifton Custard with [facility] that pt will D/C. TOC requested that MD place [facility] PT/RN/SW/Aide orders prior to D/C. TOC signing off.   Final next level of care: Home w Home Health Services Barriers to Discharge: Barriers Resolved   Patient Goals and CMS Choice CMS Medicare.gov Compare Post Acute Care list provided to:: Patient Choice offered to / list presented to : Patient  Discharge Placement                         Discharge Plan and Services Additional resources added to the After Visit Summary for                            [facility] Arranged: RN, PT, Social Work, Nurse's Aide [facility] Agency: [facility] Date [facility] Agency Contacted: 06/16/22 Time HH Agency Contacted: [phone] Representative spoke with at [facility] Agency: Clifton Custard  Social Determinants of Health (SDOH) Interventions SDOH Screenings   Food Insecurity: No Food Insecurity (06/12/2022)  Housing: Low Risk  (06/12/2022)  Transportation Needs: No Transportation Needs (06/12/2022)  Recent Concern: Transportation Needs - Unmet Transportation Needs (04/14/2022)  Utilities: Not At Risk (06/12/2022)  Alcohol Screen: Medium Risk (11/25/2020)  Depression (PHQ2-9): High Risk (05/24/2022)  Financial Resource Strain: Medium Risk (04/28/2022)  Physical Activity: Insufficiently Active (11/25/2020)  Social Connections: Socially Isolated (11/25/2020)  Stress: No Stress Concern Present (11/25/2020)  Tobacco Use: High Risk (06/12/2022)     Readmission Risk Interventions    06/14/2022   11:07 AM 03/07/2022   12:41 PM  Readmission Risk Prevention Plan  Transportation Screening Complete Complete  PCP or Specialist Appt within 3-5  Days Not Complete   Home Care Screening  Complete  Medication Review (RN CM)  Complete  HRI or Home Care Consult Complete   Social Work Consult for Recovery Care Planning/Counseling Complete   Palliative Care Screening Not Applicable   Medication Review [employer]) Complete

## 2022-06-16 NOTE — Progress Notes (Signed)
Physical Therapy Treatment Patient Details Name: Kevin Smith MRN: [id number] DOB: [birth year] Today's Date: 06/16/2022   History of Present Illness Kevin Smith is a 67 y.o. male with medical history significant for COPD, STEMI, Atria Fib, alcohol and tobacco abuse, systolic CHF.  Patient presented to the ED with complaints of low back pain that started this morning.  Denies history of back pains.  Reports abdominal discomfort but without real pain over the past few days.  Reports nausea and dry heaving without actual vomiting.  Last bowel movement was yesterday.  No blood in stool.  No fever.  Reports some pain with urination over the past few days, that was not with Foley placement today in the ED.  He is on Lasix and otherwise has not had any problems with voiding.    PT Comments    Patient sleeping at beginning of session. He is able to complete bed mobility without assist and ambulates to use rest room with mildly unsteady cadence without AD. Patient then transfers to chair and completes seated exercises. Patient able to ambulate increased distance today with use of RW with mild unsteadiness but no loss of balance. Patient returned to bed at end of session. Patient will benefit from continued skilled physical therapy in hospital and recommended venue below to increase strength, balance, endurance for safe ADLs and gait.   Recommendations for follow up therapy are one component of a multi-disciplinary discharge planning process, led by the attending physician.  Recommendations may be updated based on patient status, additional functional criteria and insurance authorization.  Follow Up Recommendations       Assistance Recommended at Discharge Set up Supervision/Assistance  Patient can return home with the following A little help with bathing/dressing/bathroom;Assistance with cooking/housework   Equipment Recommendations  None recommended by PT    Recommendations for Other Services        Precautions / Restrictions Precautions Precautions: Fall Restrictions Weight Bearing Restrictions: No     Mobility  Bed Mobility Overal bed mobility: Needs Assistance Bed Mobility: Supine to Sit     Supine to sit: Modified independent (Device/Increase time)     General bed mobility comments: Modified independent with the elevated, use of bed rails    Transfers Overall transfer level: Needs assistance Equipment used: None, Rolling walker (2 wheels) Transfers: Sit to/from Stand Sit to Stand: Modified independent (Device/Increase time)           General transfer comment: Supervision level with RW constant cueing provided for sequencing, steadiness, and increased impulsivity.    Ambulation/Gait Ambulation/Gait assistance: Modified independent (Device/Increase time) Gait Distance (Feet): 150 Feet Assistive device: Rolling walker (2 wheels) Gait Pattern/deviations: Step-through pattern, Decreased step length - right, Decreased step length - left, Shuffle, Narrow base of support Gait velocity: decreased     General Gait Details: mild unsteadiness with RW, no loss of balance   Stairs             Wheelchair Mobility    Modified Rankin (Stroke Patients Only)       Balance Overall balance assessment: Modified Independent, Needs assistance Sitting-balance support: No upper extremity supported Sitting balance-Leahy Scale: Good Sitting balance - Comments: Good/good for sitting balance lateral and A/P adjustments independently.   Standing balance support: Bilateral upper extremity supported Standing balance-Leahy Scale: Good Standing balance comment: Good/good with use of RW.  No loss of balance noted, steady standing.  Cognition Arousal/Alertness: Awake/alert Behavior During Therapy: WFL for tasks assessed/performed, Impulsive Overall Cognitive Status: Within Functional Limits for tasks assessed                                           Exercises General Exercises - Lower Extremity Long Arc Quad: AROM, Both, 10 reps, Seated Hip Flexion/Marching: AROM, Both, 10 reps, Seated    General Comments        Pertinent Vitals/Pain Pain Assessment Pain Assessment: Faces Faces Pain Scale: Hurts little more Pain Location: Low back Pain Descriptors / Indicators: Grimacing, Guarding Pain Intervention(s): Limited activity within patient's tolerance, Monitored during session, Repositioned    Home Living                          Prior Function            PT Goals (current goals can now be found in the care plan section) Acute Rehab PT Goals Patient Stated Goal: "go home" PT Goal Formulation: With patient Time For Goal Achievement: 06/29/22 Potential to Achieve Goals: Good Progress towards PT goals: Progressing toward goals    Frequency    Min 2X/week      PT Plan Current plan remains appropriate    Co-evaluation              AM-PAC PT "6 Clicks" Mobility   Outcome Measure  Help needed turning from your back to your side while in a flat bed without using bedrails?: None Help needed moving from lying on your back to sitting on the side of a flat bed without using bedrails?: None Help needed moving to and from a bed to a chair (including a wheelchair)?: None Help needed standing up from a chair using your arms (e.g., wheelchair or bedside chair)?: None Help needed to walk in hospital room?: A Little Help needed climbing 3-5 steps with a railing? : A Lot 6 Click Score: 21    End of Session   Activity Tolerance: Patient tolerated treatment well Patient left: in bed;with call bell/phone within reach Nurse Communication: Mobility status PT Visit Diagnosis: Unsteadiness on feet (R26.81);Other abnormalities of gait and mobility (R26.89)     Time: 1203-1222 PT Time Calculation (min) (ACUTE ONLY): 19 min  Charges:  $Therapeutic Activity: 8-22 mins                      12:34 PM, 06/16/22 Wyman SongsterAndrew S. Zarius Furr PT, DPT Physical Therapist at [facility]

## 2022-06-16 NOTE — Consult Note (Signed)
   [facility] CM Inpatient Consult   06/16/2022  Thorsten Benninghoff 29-Jul-1955 [id number]  Orientation with Charlesetta Shanks, RN [facility] Liaison for review.   Location: [facility] Liaison screened remotely [facility]).   [facility] Customer service manager [facility]) Accountable Care Organization [ACO] Patient: Insurance [facility])    Primary Care Provider:  Gabriel Earing, FNP [facility]   Patient screened for readmission hospitalization with noted high risk score for unplanned readmission risk  with 3 IP in 6 months. Pt actively involved with [facility] RN coordinator. [facility] RN liaison will collaborate with involved [facility] RN concerning pt's disposition.     Plan: [facility] RN liaison will continue to follow ongoing disposition and continue to collaborate accordingly with the involved team.    [facility] Care Management/Population Health does not replace or interfere with any arrangements made by the Inpatient Transition of Care team.   For questions contact:    Elliot Cousin, RN, BSN [facility] Liaison    [facility]  Population Health Office Hours M-F 8:00 am to 5 pm [phone] mobile [phone] [Office toll free line][facility] Office Hours are M-F 8:30 - 5 pm 24 hour nurse advise line [phone] Conceirge  [email or web address]

## 2022-06-19 ENCOUNTER — Ambulatory Visit: Payer: Self-pay

## 2022-06-19 ENCOUNTER — Telehealth: Payer: Self-pay | Admitting: *Deleted

## 2022-06-19 ENCOUNTER — Telehealth: Payer: Self-pay

## 2022-06-19 ENCOUNTER — Telehealth: Payer: Self-pay | Admitting: Family Medicine

## 2022-06-19 NOTE — Transitions of Care (Post Inpatient/ED Visit) (Signed)
   06/19/2022  Name: Kevin Smith MRN: [id number] DOB: 06/01/1955  Today's TOC FU Call Status: Today's TOC FU Call Status:: Unsuccessul Call (1st Attempt) Unsuccessful Call (1st Attempt) Date: 06/19/22  Attempted to reach the patient regarding the most recent Inpatient/ED visit.  Follow Up Plan: Additional outreach attempts will be made to reach the patient to complete the Transitions of Care (Post Inpatient/ED visit) call.   Demetrios Loll, BSN, RN-BC RN Care Coordinator [facility]  [facility] Direct Dial: [phone] Main #: [phone]

## 2022-06-19 NOTE — Progress Notes (Unsigned)
  Care Coordination Note  06/19/2022 Name: Kevin Smith MRN: [id number] DOB: [birth year]  Kevin Smith is a 67 y.o. year old male who is a primary care patient of Gabriel Earing, FNP and is actively engaged with the Chronic Care Management team. I reached out to [facility] by phone today to assist with re-scheduling a follow up visit with the Pharmacist  Follow up plan: Unsuccessful telephone outreach attempt made. A HIPAA compliant phone message was left for the patient providing contact information and requesting a return call.  The care management team will reach out to the patient again over the next 7 days.  If patient returns call to provider office, please advise to call CCM Care Guide Penne Lash  at [phone]  Penne Lash, RMA Care Guide [facility]  [place], [place] [address] Direct Dial: [phone] [email or web address]

## 2022-06-19 NOTE — Telephone Encounter (Signed)
ok 

## 2022-06-19 NOTE — Chronic Care Management (AMB) (Signed)
   06/19/2022  Nancy Tincher 07/24/1955 [id number]   Reason for Encounter: Patient is not currently enrolled in the CCM program. CCM status changed to previously enrolled.   Katha Cabal RN Care Manager/Chronic Care Management [phone]

## 2022-06-20 ENCOUNTER — Telehealth: Payer: Self-pay | Admitting: *Deleted

## 2022-06-20 NOTE — Transitions of Care (Post Inpatient/ED Visit) (Signed)
   06/20/2022  Name: Kevin Smith MRN: [id number] DOB: Oct 08, 1955  Today's TOC FU Call Status: Today's TOC FU Call Status:: Unsuccessful Call (2nd Attempt) Unsuccessful Call (2nd Attempt) Date: 06/20/22  Attempted to reach the patient regarding the most recent Inpatient/ED visit. Interventions Today    Flowsheet Row Most Recent Value  Chronic Disease   Chronic disease during today's visit Other  [hospitalization for diverticulitis]       TOC Interventions Today    Flowsheet Row Most Recent Value  TOC Interventions   TOC Interventions Discussed/Reviewed Contacted provider for patient needs  [Staff message sent to [facility] Clinical staff advising that 2 TOC calls have been placed unsuccessfully but that he is eligible for a TCM visit if they are able schedule that. I will attempt a 3rd call tomorrow and assist with appt if he doesn't have one.]       Follow Up Plan: Additional outreach attempts will be made to reach the patient to complete the Transitions of Care (Post Inpatient/ED visit) call.   Demetrios Loll, BSN, RN-BC RN Care Coordinator [facility]  [facility] Direct Dial: [phone] Main #: [phone]

## 2022-06-21 ENCOUNTER — Encounter: Payer: Self-pay | Admitting: *Deleted

## 2022-06-21 ENCOUNTER — Telehealth: Payer: Self-pay | Admitting: *Deleted

## 2022-06-21 NOTE — Transitions of Care (Post Inpatient/ED Visit) (Signed)
   06/21/2022  Name: Kevin Smith MRN: [id number] DOB: 18-Jul-1955  Today's TOC FU Call Status: Today's TOC FU Call Status:: Successful TOC FU Call Competed TOC FU Call Complete Date: 06/21/22  Transition Care Management Follow-up Telephone Call Date of Discharge: 06/16/19 [facility] Type of Discharge: Inpatient Admission Primary Inpatient Discharge Diagnosis:: Diverticulitis How have you been since you were released from the hospital?: Same (continued back pain) Any questions or concerns?: No  Items Reviewed: Did you receive and understand the discharge instructions provided?: Yes Medications obtained and verified?: Yes (Medications Reviewed) Any new allergies since your discharge?: No Dietary orders reviewed?: Yes Type of Diet Ordered:: Soft diet Do you have support at home?: Yes People in Home: alone Name of Support/Comfort Primary Source: Tamera Punt (daughter)  Home Care and Equipment/Supplies: Were Home Health Services Ordered?: Yes Name of Home Health Agency:: [facility] Has Agency set up a time to come to your home?: Yes First Home Health Visit Date: 06/23/22 Any new equipment or medical supplies ordered?: No  Functional Questionnaire: Do you need assistance with bathing/showering or dressing?: No Do you need assistance with meal preparation?: No Do you need assistance with eating?: No Do you have difficulty maintaining continence: No Do you need assistance with getting out of bed/getting out of a chair/moving?: No Do you have difficulty managing or taking your medications?: No  Follow up appointments reviewed: PCP Follow-up appointment confirmed?: Yes Date of PCP follow-up appointment?: 06/22/22 Follow-up Provider: [facility] Follow-up appointment confirmed?: NA Do you need transportation to your follow-up appointment?: No Do you understand care options if your condition(s) worsen?: Yes-patient verbalized  understanding  SDOH Interventions Today    Flowsheet Row Most Recent Value  SDOH Interventions   Transportation Interventions Patient Resources (Friends/Family)  Financial Strain Interventions Other (Comment)  [Still has some difficulty paying for meds (Jardiance). [facility]]      Interventions Today    Flowsheet Row Most Recent Value  Chronic Disease   Chronic disease during today's visit Other  [hospitalization for diverticulitis, back pain]  Education Interventions   Education Provided Provided Education  Provided Verbal Education On When to see the doctor, Mental Health/Coping with Illness, Medication  Pharmacy Interventions   Pharmacy Dicussed/Reviewed Medications and their functions  Safety Interventions   Safety Discussed/Reviewed Safety Discussed, Fall Risk, Home Safety      TOC Interventions Today    Flowsheet Row Most Recent Value  TOC Interventions   TOC Interventions Discussed/Reviewed Arranged PCP follow up within 7 days/Care Guide scheduled      Demetrios Loll, BSN, RN-BC RN Care Coordinator [facility]  [facility] Direct Dial: [phone] Main #: [phone]

## 2022-06-21 NOTE — Telephone Encounter (Signed)
-----   Message from Gwenith Daily, RN sent at 06/20/2022 10:16 AM EDT ----- Regarding: Hosp Follow-up/TOC appt Hi All,  I've tried to reach Mr Ervin for a TOC call but have been unsuccessful x 2. I will try again tomorrow. He is eligible for a TCM follow-up appt though if you all want to try and schedule him an appt. Discharge instructions recommended following up within a week of completing antibiotic, which should be between 4/11-4/17, to check for diverticulitis resolution. He'll need a repeat BMP as well. If I am able to get him tomorrow and he doesn't have an appt, I'l assist in scheduling that. Just wanted to let you know.  Thanks! Baxter Hire

## 2022-06-21 NOTE — Telephone Encounter (Signed)
Called patient to make an appointment na

## 2022-06-22 ENCOUNTER — Ambulatory Visit: Payer: Medicare HMO | Admitting: Internal Medicine

## 2022-06-22 ENCOUNTER — Emergency Department (HOSPITAL_COMMUNITY): Payer: Medicare HMO

## 2022-06-22 ENCOUNTER — Other Ambulatory Visit: Payer: Self-pay

## 2022-06-22 ENCOUNTER — Encounter (HOSPITAL_COMMUNITY): Payer: Self-pay

## 2022-06-22 ENCOUNTER — Encounter: Payer: Self-pay | Admitting: Family Medicine

## 2022-06-22 ENCOUNTER — Ambulatory Visit (INDEPENDENT_AMBULATORY_CARE_PROVIDER_SITE_OTHER): Payer: Medicare HMO | Admitting: Family Medicine

## 2022-06-22 ENCOUNTER — Emergency Department (HOSPITAL_COMMUNITY)
Admission: EM | Admit: 2022-06-22 | Discharge: 2022-06-22 | Disposition: A | Discharge status: Home / Self Care | Payer: Medicare HMO | Attending: Student | Admitting: Student

## 2022-06-22 VITALS — BP 126/84 | HR 82 | Temp 97.2°F | Ht 71.0 in | Wt 235.0 lb

## 2022-06-22 DIAGNOSIS — R55 Syncope and collapse: Secondary | ICD-10-CM | POA: Insufficient documentation

## 2022-06-22 DIAGNOSIS — I700 Atherosclerosis of aorta: Secondary | ICD-10-CM | POA: Insufficient documentation

## 2022-06-22 DIAGNOSIS — R069 Unspecified abnormalities of breathing: Secondary | ICD-10-CM | POA: Diagnosis not present

## 2022-06-22 DIAGNOSIS — K5730 Diverticulosis of large intestine without perforation or abscess without bleeding: Secondary | ICD-10-CM | POA: Diagnosis not present

## 2022-06-22 DIAGNOSIS — F411 Generalized anxiety disorder: Secondary | ICD-10-CM

## 2022-06-22 DIAGNOSIS — I2510 Atherosclerotic heart disease of native coronary artery without angina pectoris: Secondary | ICD-10-CM | POA: Diagnosis not present

## 2022-06-22 DIAGNOSIS — I252 Old myocardial infarction: Secondary | ICD-10-CM

## 2022-06-22 DIAGNOSIS — R079 Chest pain, unspecified: Secondary | ICD-10-CM | POA: Diagnosis not present

## 2022-06-22 DIAGNOSIS — M545 Low back pain, unspecified: Secondary | ICD-10-CM | POA: Insufficient documentation

## 2022-06-22 DIAGNOSIS — I959 Hypotension, unspecified: Secondary | ICD-10-CM | POA: Diagnosis not present

## 2022-06-22 DIAGNOSIS — R109 Unspecified abdominal pain: Secondary | ICD-10-CM | POA: Diagnosis not present

## 2022-06-22 DIAGNOSIS — N281 Cyst of kidney, acquired: Secondary | ICD-10-CM | POA: Diagnosis not present

## 2022-06-22 DIAGNOSIS — Z7902 Long term (current) use of antithrombotics/antiplatelets: Secondary | ICD-10-CM | POA: Diagnosis not present

## 2022-06-22 DIAGNOSIS — Z7901 Long term (current) use of anticoagulants: Secondary | ICD-10-CM | POA: Diagnosis not present

## 2022-06-22 DIAGNOSIS — D72829 Elevated white blood cell count, unspecified: Secondary | ICD-10-CM | POA: Insufficient documentation

## 2022-06-22 DIAGNOSIS — Z951 Presence of aortocoronary bypass graft: Secondary | ICD-10-CM | POA: Insufficient documentation

## 2022-06-22 DIAGNOSIS — R0602 Shortness of breath: Secondary | ICD-10-CM | POA: Diagnosis not present

## 2022-06-22 DIAGNOSIS — Z79899 Other long term (current) drug therapy: Secondary | ICD-10-CM | POA: Insufficient documentation

## 2022-06-22 LAB — COMPREHENSIVE METABOLIC PANEL
ALT: 44 U/L (ref 0–44)
AST: 31 U/L (ref 15–41)
Albumin: 3.7 g/dL (ref 3.5–5.0)
Alkaline Phosphatase: 71 U/L (ref 38–126)
Anion gap: 13 (ref 5–15)
BUN: 42 mg/dL — ABNORMAL HIGH (ref 8–23)
CO2: 25 mmol/L (ref 22–32)
Calcium: 8.8 mg/dL — ABNORMAL LOW (ref 8.9–10.3)
Chloride: 94 mmol/L — ABNORMAL LOW (ref 98–111)
Creatinine, Ser: 1.71 mg/dL — ABNORMAL HIGH (ref 0.61–1.24)
GFR, Estimated: 44 mL/min — ABNORMAL LOW (ref >60)
Glucose, Bld: 89 mg/dL (ref 70–99)
Potassium: 3.9 mmol/L (ref 3.5–5.1)
Sodium: 132 mmol/L — ABNORMAL LOW (ref 135–145)
Total Bilirubin: 0.8 mg/dL (ref 0.3–1.2)
Total Protein: 7.4 g/dL (ref 6.5–8.1)

## 2022-06-22 LAB — URINALYSIS, ROUTINE W REFLEX MICROSCOPIC
Bilirubin Urine: NEGATIVE
Glucose, UA: NEGATIVE mg/dL
Hgb urine dipstick: NEGATIVE
Ketones, ur: NEGATIVE mg/dL
Leukocytes,Ua: NEGATIVE
Nitrite: NEGATIVE
Protein, ur: NEGATIVE mg/dL
Specific Gravity, Urine: 1.011 (ref 1.005–1.030)
pH: 5 (ref 5.0–8.0)

## 2022-06-22 LAB — CBC
HCT: 49.2 % (ref 39.0–52.0)
Hemoglobin: 16.7 g/dL (ref 13.0–17.0)
MCH: 32.4 pg (ref 26.0–34.0)
MCHC: 33.9 g/dL (ref 30.0–36.0)
MCV: 95.3 fL (ref 80.0–100.0)
Platelets: 367 10*3/uL (ref 150–400)
RBC: 5.16 MIL/uL (ref 4.22–5.81)
RDW: 14.3 % (ref 11.5–15.5)
WBC: 16 10*3/uL — ABNORMAL HIGH (ref 4.0–10.5)
nRBC: 0 % (ref 0.0–0.2)

## 2022-06-22 LAB — RAPID URINE DRUG SCREEN, HOSP PERFORMED
Amphetamines: NOT DETECTED
Barbiturates: NOT DETECTED
Benzodiazepines: NOT DETECTED
Cocaine: NOT DETECTED
Opiates: NOT DETECTED
Tetrahydrocannabinol: POSITIVE — AB

## 2022-06-22 LAB — TROPONIN I (HIGH SENSITIVITY)
Troponin I (High Sensitivity): 11 ng/L (ref <18)
Troponin I (High Sensitivity): 9 ng/L (ref <18)

## 2022-06-22 LAB — ETHANOL: Alcohol, Ethyl (B): <10 mg/dL (ref <10)

## 2022-06-22 MED ORDER — SODIUM CHLORIDE 0.9 % IV BOLUS
500.0000 mL | Freq: Once | INTRAVENOUS | Status: AC
  Administered 2022-06-22: 500 mL via INTRAVENOUS

## 2022-06-22 MED ORDER — HYDROCODONE-ACETAMINOPHEN 5-325 MG PO TABS
1.0000 | ORAL_TABLET | Freq: Once | ORAL | Status: AC
  Administered 2022-06-22: 1 via ORAL
  Filled 2022-06-22: qty 1

## 2022-06-22 MED ORDER — LIDOCAINE 5 % EX PTCH
1.0000 | MEDICATED_PATCH | CUTANEOUS | Status: DC
  Administered 2022-06-22: 1 via TRANSDERMAL
  Filled 2022-06-22: qty 1

## 2022-06-22 MED ORDER — SODIUM CHLORIDE 0.9 % IV BOLUS: 1000.0000 mL | Freq: Once | INTRAVENOUS | Status: DC

## 2022-06-22 MED ORDER — IOHEXOL 300 MG/ML  SOLN
80.0000 mL | Freq: Once | INTRAMUSCULAR | Status: AC | PRN
  Administered 2022-06-22: 80 mL via INTRAVENOUS
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
## 2022-06-22 NOTE — ED Provider Notes (Signed)
[place] EMERGENCY DEPARTMENT AT [facility] Provider Note   CSN: [id number] Arrival date & time: 06/22/22  1436     History  Chief Complaint  Patient presents with   Near Syncope   Back Pain    Kevin Smith is a 67 y.o. male, history of CAD, alcohol abuse, who presents to the ED secondary to a syncopal episode that occurred while he is at his doctor's office today.  He states that he is at his doctor's office today for a follow-up for some right-sided back pain has been going on for the last 2 weeks after falling.  States that the next day he went and was found to have acute diverticulitis, put on antibiotics, and pain meds help with his back pain, but never resolved it.  He states that since then he has just had this recurrent right-sided back pain, that is on the soft side of his back.  He is not take any meds for this, and is persisted.  Went to his doctor today, and was going to give a urine sample, when according to the nurse he went blue, and passed out and slid down the wall.  Denies any head trauma, is not on any blood thinners.  Denies any chest pain or shortness of breath, does state that he had a little bit dizzy prior to the episode.  Endorses mild abdominal pain, that is been intermittent.  No nausea, vomiting.  States he feels fine now.     Home Medications Prior to Admission medications   Medication Sig Start Date End Date Taking? Authorizing Provider  acetaminophen (TYLENOL) 500 MG tablet Take 1 tablet (500 mg total) by mouth every 6 (six) hours as needed for mild pain. Patient taking differently: Take 1,000 mg by mouth every 6 (six) hours as needed for mild pain. 01/02/22   Arrien, York Ram, MD  albuterol (PROVENTIL) (2.5 MG/3ML) 0.083% nebulizer solution Take 3 mLs (2.5 mg total) by nebulization every 4 (four) hours as needed for wheezing or shortness of breath. 06/16/22   Shon Hale, MD  amiodarone (PACERONE) 200 MG tablet Take 1 tablet (200 mg  total) by mouth daily. 06/16/22   Shon Hale, MD  apixaban (ELIQUIS) 5 MG TABS tablet Take 1 tablet (5 mg total) by mouth 2 (two) times daily. 06/16/22   Shon Hale, MD  atorvastatin (LIPITOR) 80 MG tablet Take 1 tablet (80 mg total) by mouth daily. 01/09/22   Raliegh Ip, DO  Budeson-Glycopyrrol-Formoterol (BREZTRI AEROSPHERE) 160-9-4.8 MCG/ACT AERO Inhale 2 puffs into the lungs 2 (two) times daily. 06/16/22   Shon Hale, MD  busPIRone (BUSPAR) 10 MG tablet Take 1 tablet (10 mg total) by mouth 2 (two) times daily. 06/16/22   Shon Hale, MD  clopidogrel (PLAVIX) 75 MG tablet Take 1 tablet (75 mg total) by mouth daily. 06/16/22   Shon Hale, MD  desvenlafaxine (PRISTIQ) 100 MG 24 hr tablet Take 1 tablet (100 mg total) by mouth daily. 06/16/22   Shon Hale, MD  empagliflozin (JARDIANCE) 10 MG TABS tablet Take 1 tablet (10 mg total) by mouth daily. 06/16/22   Shon Hale, MD  ezetimibe (ZETIA) 10 MG tablet Take 1 tablet (10 mg total) by mouth daily. 06/16/22   Shon Hale, MD  folic acid (FOLVITE) 1 MG tablet Take 1 tablet (1 mg total) by mouth daily. 06/16/22   Shon Hale, MD  furosemide (LASIX) 40 MG tablet Take 1 tablet (40 mg total) by mouth daily. 06/16/22   Emokpae,  Courage, MD  methocarbamol (ROBAXIN) 750 MG tablet Take 1 tablet (750 mg total) by mouth 3 (three) times daily. 06/16/22   Shon Hale, MD  metoprolol succinate (TOPROL XL) 50 MG 24 hr tablet Take 1 tablet (50 mg total) by mouth daily. 06/16/22 06/16/23  Shon Hale, MD  nicotine (NICODERM CQ - DOSED IN MG/24 HOURS) 21 mg/24hr patch Place 1 patch (21 mg total) onto the skin daily. 06/17/22   Shon Hale, MD  nitroGLYCERIN (NITROSTAT) 0.4 MG SL tablet Place 1 tablet (0.4 mg total) under the tongue every 5 (five) minutes x 3 doses as needed for chest pain. 03/14/22   Daryll Drown, NP  oxyCODONE (OXY IR/ROXICODONE) 5 MG immediate release tablet Take 1 tablet (5 mg total) by mouth every 4  (four) hours as needed for moderate pain. 06/16/22   Shon Hale, MD  pantoprazole (PROTONIX) 40 MG tablet Take 1 tablet (40 mg total) by mouth 2 (two) times daily. 06/16/22   Shon Hale, MD  rOPINIRole (REQUIP) 1 MG tablet Take 2 tablets (2 mg total) by mouth at bedtime. 04/07/22   Gabriel Earing, FNP  senna-docusate (SENOKOT-S) 8.6-50 MG tablet Take 2 tablets by mouth at bedtime. 06/16/22   Shon Hale, MD  tamsulosin (FLOMAX) 0.4 MG CAPS capsule Take 1 capsule (0.4 mg total) by mouth daily after supper. 06/16/22   Shon Hale, MD  valACYclovir HCl (VALTREX PO) Take by mouth.    [provider]  VENTOLIN HFA 108 (90 Base) MCG/ACT inhaler Inhale 2 puffs into the lungs every 6 (six) hours as needed for wheezing or shortness of breath. 03/09/22   Shon Hale, MD  zolpidem (AMBIEN) 5 MG tablet Take 1 tablet (5 mg total) by mouth at bedtime as needed. for sleep 04/10/22   Gabriel Earing, FNP      Allergies    Sulfa antibiotics    Review of Systems   Review of Systems  Gastrointestinal:  Negative for nausea and vomiting.  Musculoskeletal:  Positive for back pain.  Neurological:  Positive for syncope.    Physical Exam Updated Vital Signs BP (!) 117/95   Pulse 74   Temp 98 F (36.7 C)   Resp 17   Ht 5\' 11"  (1.803 m)   Wt 106 kg   SpO2 94%   BMI 32.59 kg/m  Physical Exam Vitals and nursing note reviewed.  Constitutional:      General: He is not in acute distress.    Appearance: He is well-developed.  HENT:     Head: Normocephalic and atraumatic.  Eyes:     Conjunctiva/sclera: Conjunctivae normal.  Cardiovascular:     Rate and Rhythm: Normal rate and regular rhythm.     Heart sounds: No murmur heard. Pulmonary:     Effort: Pulmonary effort is normal. No respiratory distress.     Breath sounds: Normal breath sounds.  Abdominal:     Palpations: Abdomen is soft.     Tenderness: There is no abdominal tenderness.  Musculoskeletal:        General:  No swelling.     Cervical back: Neck supple.  Skin:    General: Skin is warm and dry.     Capillary Refill: Capillary refill takes less than 2 seconds.  Neurological:     General: No focal deficit present.     Mental Status: He is alert and oriented to person, place, and time.  Psychiatric:        Mood and Affect: Mood normal.  ED Results / Procedures / Treatments   Labs (all labs ordered are listed, but only abnormal results are displayed) Labs Reviewed  CBC - Abnormal; Notable for the following components:      Result Value   WBC 16.0 (*)    All other components within normal limits  COMPREHENSIVE METABOLIC PANEL - Abnormal; Notable for the following components:   Sodium 132 (*)    Chloride 94 (*)    BUN 42 (*)    Creatinine, Ser 1.71 (*)    Calcium 8.8 (*)    GFR, Estimated 44 (*)    All other components within normal limits  RAPID URINE DRUG SCREEN, HOSP PERFORMED - Abnormal; Notable for the following components:   Tetrahydrocannabinol POSITIVE (*)    All other components within normal limits  URINALYSIS, ROUTINE W REFLEX MICROSCOPIC  ETHANOL  TROPONIN I (HIGH SENSITIVITY)  TROPONIN I (HIGH SENSITIVITY)    EKG EKG Interpretation  Date/Time:  Thursday June 22 2022 14:58:02 EDT Ventricular Rate:  77 PR Interval:  161 QRS Duration: 163 QT Interval:  474 QTC Calculation: 537 R Axis:   67 Text Interpretation: Sinus rhythm Right bundle branch block Confirmed by Vonita MossPaterson, Robert [phone]([id number]) on 06/22/2022 3:37:31 PM  Radiology DG Chest 2 View  Result Date: 06/22/2022 CLINICAL DATA:  Status post fall 2 weeks ago with subsequent lower back pain and shortness of breath. EXAM: CHEST - 2 VIEW COMPARISON:  June 15, 2022 FINDINGS: Multiple sternal wires and vascular clips are noted. The heart size and mediastinal contours are within normal limits. There is moderate severity calcification the aortic arch and tortuosity of the descending thoracic aorta. Mild, diffuse, chronic  appearing increased interstitial lung markings are seen. Mild left perihilar linear scarring and/or atelectasis is noted. This is decreased in severity when compared to the prior study. No pleural effusion or pneumothorax is identified. The visualized skeletal structures are unremarkable. IMPRESSION: 1. Evidence of prior median sternotomy/CABG. 2. Chronic appearing increased interstitial lung markings with mild left perihilar linear scarring and/or atelectasis. Electronically Signed   By: Aram Candelahaddeus  Houston M.D.   On: 06/22/2022 18:34   CT ABDOMEN PELVIS W CONTRAST  Result Date: 06/22/2022 CLINICAL DATA:  Syncope.  Status post fall with back pain. EXAM: CT ABDOMEN AND PELVIS WITH CONTRAST TECHNIQUE: Multidetector CT imaging of the abdomen and pelvis was performed using the standard protocol following bolus administration of intravenous contrast. RADIATION DOSE REDUCTION: This exam was performed according to the departmental dose-optimization program which includes automated exposure control, adjustment of the mA and/or kV according to patient size and/or use of iterative reconstruction technique. CONTRAST:  80mL OMNIPAQUE IOHEXOL 300 MG/ML  SOLN COMPARISON:  June 12, 2022 FINDINGS: Lower chest: No acute abnormality. Hepatobiliary: 1.3 cm left liver cyst. The liver is otherwise normal in appearance. Gallbladder is normal. Pancreas: Unremarkable. No pancreatic ductal dilatation or surrounding inflammatory changes. Spleen: Normal in size without focal abnormality. Adrenals/Urinary Tract: Calcifications within the right adrenal gland, nonspecific. Normal left adrenal gland. Bilateral too Alfio Loescher to be actually characterize hypoattenuated masses of the kidneys. 3.8 cm left renal cyst. Urinary bladder is decompressed but grossly normal. Stomach/Bowel: Stomach is within normal limits. Appendix appears normal. No evidence of bowel wall thickening, distention, or inflammatory changes. Left colonic diverticulosis without  evidence of diverticulitis. Vascular/Lymphatic: Aortic atherosclerosis. 2.5 cm focal aneurysmal dilation of the infrarenal aorta. No enlarged abdominal or pelvic lymph nodes. Reproductive: Prostate is unremarkable. Other: No abdominal wall hernia or abnormality. No abdominopelvic ascites. Musculoskeletal: No evidence of  acute fracture. Right femoral IM pin fixation. IMPRESSION: 1. No evidence of acute traumatic injury to the abdomen or pelvis. 2. 2.5 cm focal aneurysmal dilation of the infrarenal aorta. Follow-up in 6 months with vascular ultrasound may be considered. 3. Left colonic diverticulosis without evidence of diverticulitis. 4. Aortic atherosclerosis. Aortic Atherosclerosis (ICD10-I70.0). Electronically Signed   By: Ted Mcalpine M.D.   On: 06/22/2022 17:31    Procedures Procedures    Medications Ordered in ED Medications  lidocaine (LIDODERM) 5 % 1 patch (1 patch Transdermal Patch Applied 06/22/22 1541)  sodium chloride 0.9 % bolus 500 mL (has no administration in time range)  HYDROcodone-acetaminophen (NORCO/VICODIN) 5-325 MG per tablet 1 tablet (1 tablet Oral Given 06/22/22 1541)  iohexol (OMNIPAQUE) 300 MG/ML solution 80 mL (80 mLs Intravenous Contrast Given 06/22/22 1656)    ED Course/ Medical Decision Making/ A&P                             Medical Decision Making Patient is 67 year old male, here for syncopal episode today that came about when he was at his primary care doctors giving a urine sample.  He states he felt a bit dizzy prior to this, and then fell, did not hit his head, but, slid down the wall.  Was witnessed by nurses, which in the note states that he went pale.  He has not had any weakness in his legs, numbness, weakness on one side of the body, confusion, chest pain, shortness of breath.  He is overall well-appearing he has had this persistent right-sided low back pain, since falling about a week and a half ago.  Has no other complaints will obtain labs,  urinalysis, CT abdomen pelvis given back pain.  Amount and/or Complexity of Data Reviewed Labs: ordered.    Details: Leukocytosis of 16.0, normal troponin Radiology: ordered.    Details: CT abdomen pelvis shows 2.5 cm focal aneurysmal dilation of the infrarenal aorta, no other acute findings Discussion of management or test interpretation with external provider(s): Discussed with Dr. Juanetta Gosling, concern for possible new 2.5 cm focal aneurysm dilation not seen on 4/1 CT scan, he reviewed the CT scans, and he states that just not previously noted in the 4/1 scan, but that it has been present and consistent.  No growth.  Patient's leukocytosis may be reactive from syncopal episode is unclear, and may be vasovagal in nature, his orthostatics, were within normal limits, however he did feel dizzy with standing, this may be secondary to hypovolemia as his creatinine has elevated 0.3 in the past 10 days.  He was given IV fluids for further hydration.  He has no neurodeficits, is neurovascularly intact, and has no complaints other than his right-sided back pain that has just been persistent since the fall 2 weeks ago.  He is overall well-appearing and we discussed return precautions follow-up with PCP, recheck leukocytosis, and follow-up with Dr. Juanetta Gosling for his aneurysm.  Risk Prescription drug management.    Final Clinical Impression(s) / ED Diagnoses Final diagnoses:  Syncope, unspecified syncope type  Acute low back pain without sciatica, unspecified back pain laterality    Rx / DC Orders ED Discharge Orders     None         Dolphus Jenny, Harley [place], PA 06/22/22 1856    Rondel Baton, MD 06/23/22 [phone]

## 2022-06-22 NOTE — ED Notes (Signed)
Pt attempted to provide urine specimen, but unable to go at this time.

## 2022-06-22 NOTE — Progress Notes (Signed)
Called RE: patient with near syncope and back pain.  ER staff appropriately concerned about report of "new" abdominal aortic aneurysm which was not reported on recent CT angiogram 06/12/22.  On my personal review of the images, the aneurysm was present and similar in size and configuration on scan of 06/12/22. It appears unchanged on the scan today. There are no worrisome findings such as stranding   Recommend outpatient follow up with me or PCP to begin surveillance of small abdominal aortic aneurysm. Please call for any questions  Rande Brunt. Lenell Antu, MD [facility] Phone Number: [phone] 06/22/2022 6:15 PM

## 2022-06-22 NOTE — ED Triage Notes (Signed)
Pt BIB [facility] coming from his PCP for a f/u of a fall that happened 2 weeks ago. EMS received report of pt having syncopal episode while at the office, but pt states he was just feeling lightheaded from the pain. Pt reporting uncontrolled lower back pain that has been persistent since his fall.

## 2022-06-22 NOTE — Discharge Instructions (Addendum)
Please follow-up with your primary care doctor, the cause of your seeking syncope cannot be determined today.  If you have any chest pain, shortness of breath please return to the ER.  Additionally the cause of your back pain cannot be determined, this may be musculoskeletal.  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  He will need to follow-up with Dr. Juanetta Gosling our vascular surgeon for further evaluation and monitoring.  Additionally her white count was up, there is unknown cause for this, please repeat your labs in 1 week.

## 2022-06-23 ENCOUNTER — Telehealth: Payer: Self-pay | Admitting: Family Medicine

## 2022-06-23 DIAGNOSIS — K5732 Diverticulitis of large intestine without perforation or abscess without bleeding: Secondary | ICD-10-CM | POA: Diagnosis not present

## 2022-06-23 DIAGNOSIS — I2510 Atherosclerotic heart disease of native coronary artery without angina pectoris: Secondary | ICD-10-CM | POA: Diagnosis not present

## 2022-06-23 DIAGNOSIS — J4489 Other specified chronic obstructive pulmonary disease: Secondary | ICD-10-CM | POA: Diagnosis not present

## 2022-06-23 DIAGNOSIS — I5022 Chronic systolic (congestive) heart failure: Secondary | ICD-10-CM | POA: Diagnosis not present

## 2022-06-23 DIAGNOSIS — M62838 Other muscle spasm: Secondary | ICD-10-CM

## 2022-06-23 DIAGNOSIS — I130 Hypertensive heart and chronic kidney disease with heart failure and stage 1 through stage 4 chronic kidney disease, or unspecified chronic kidney disease: Secondary | ICD-10-CM | POA: Diagnosis not present

## 2022-06-23 DIAGNOSIS — I480 Paroxysmal atrial fibrillation: Secondary | ICD-10-CM | POA: Diagnosis not present

## 2022-06-23 DIAGNOSIS — F419 Anxiety disorder, unspecified: Secondary | ICD-10-CM | POA: Diagnosis not present

## 2022-06-23 DIAGNOSIS — E1122 Type 2 diabetes mellitus with diabetic chronic kidney disease: Secondary | ICD-10-CM | POA: Diagnosis not present

## 2022-06-23 DIAGNOSIS — N1831 Chronic kidney disease, stage 3a: Secondary | ICD-10-CM | POA: Diagnosis not present

## 2022-06-23 NOTE — Telephone Encounter (Signed)
I spoke to pt and he says a nurse from [facility] just left the house and she had found the muscle relaxers so pt is ok.

## 2022-06-23 NOTE — Telephone Encounter (Signed)
Robaxin was sent in on 06/15/21 with refills.

## 2022-06-23 NOTE — Telephone Encounter (Signed)
Patient aware.

## 2022-06-23 NOTE — Telephone Encounter (Signed)
Pt called stating that he talked with someone this morning who told him that a new Rx for muscle relaxers was going to be sent in for him today and is wondering why [facility] has not received anything.  Explained to pt that it looks like Robaxin was sent in for him on 4/5. And then I called [facility] to make sure they received the Rx because pt says he has not received any muscle relaxers. Per pharmacy, Robaxin was filled and picked up by a [facility] 7 days ago. I called pt back to make him aware of this and pt says he knows that but was told today that a different muscle relaxer was going to be sent in..  Can someone advise on this and call patient to clarify. I dont see any documentation of new muscle relaxer Rx.

## 2022-06-23 NOTE — Telephone Encounter (Signed)
Per chart notes, patient was seen yesterday by Dr. Darlyn Read but while in office developed syncopal episode and was transferred to ER via EMS.  Patient was given one Hydrocodone and a lidocaine patch applied for back/flank pain while in ER.  Patient was discharged home.

## 2022-06-28 DIAGNOSIS — F419 Anxiety disorder, unspecified: Secondary | ICD-10-CM | POA: Diagnosis not present

## 2022-06-28 DIAGNOSIS — I480 Paroxysmal atrial fibrillation: Secondary | ICD-10-CM | POA: Diagnosis not present

## 2022-06-28 DIAGNOSIS — I130 Hypertensive heart and chronic kidney disease with heart failure and stage 1 through stage 4 chronic kidney disease, or unspecified chronic kidney disease: Secondary | ICD-10-CM | POA: Diagnosis not present

## 2022-06-28 DIAGNOSIS — K5732 Diverticulitis of large intestine without perforation or abscess without bleeding: Secondary | ICD-10-CM | POA: Diagnosis not present

## 2022-06-28 DIAGNOSIS — E1122 Type 2 diabetes mellitus with diabetic chronic kidney disease: Secondary | ICD-10-CM | POA: Diagnosis not present

## 2022-06-28 DIAGNOSIS — I5022 Chronic systolic (congestive) heart failure: Secondary | ICD-10-CM | POA: Diagnosis not present

## 2022-06-28 DIAGNOSIS — I2510 Atherosclerotic heart disease of native coronary artery without angina pectoris: Secondary | ICD-10-CM | POA: Diagnosis not present

## 2022-06-28 DIAGNOSIS — N1831 Chronic kidney disease, stage 3a: Secondary | ICD-10-CM | POA: Diagnosis not present

## 2022-06-28 DIAGNOSIS — J4489 Other specified chronic obstructive pulmonary disease: Secondary | ICD-10-CM | POA: Diagnosis not present

## 2022-06-29 ENCOUNTER — Inpatient Hospital Stay: Payer: Medicare HMO | Admitting: Family Medicine

## 2022-07-04 ENCOUNTER — Other Ambulatory Visit: Payer: Self-pay | Admitting: Family Medicine

## 2022-07-04 DIAGNOSIS — I480 Paroxysmal atrial fibrillation: Secondary | ICD-10-CM | POA: Diagnosis not present

## 2022-07-04 DIAGNOSIS — I2510 Atherosclerotic heart disease of native coronary artery without angina pectoris: Secondary | ICD-10-CM | POA: Diagnosis not present

## 2022-07-04 DIAGNOSIS — J4489 Other specified chronic obstructive pulmonary disease: Secondary | ICD-10-CM | POA: Diagnosis not present

## 2022-07-04 DIAGNOSIS — I5022 Chronic systolic (congestive) heart failure: Secondary | ICD-10-CM | POA: Diagnosis not present

## 2022-07-04 DIAGNOSIS — F5101 Primary insomnia: Secondary | ICD-10-CM

## 2022-07-04 DIAGNOSIS — F419 Anxiety disorder, unspecified: Secondary | ICD-10-CM | POA: Diagnosis not present

## 2022-07-04 DIAGNOSIS — G2581 Restless legs syndrome: Secondary | ICD-10-CM

## 2022-07-04 DIAGNOSIS — N1831 Chronic kidney disease, stage 3a: Secondary | ICD-10-CM | POA: Diagnosis not present

## 2022-07-04 DIAGNOSIS — I130 Hypertensive heart and chronic kidney disease with heart failure and stage 1 through stage 4 chronic kidney disease, or unspecified chronic kidney disease: Secondary | ICD-10-CM | POA: Diagnosis not present

## 2022-07-04 DIAGNOSIS — K5732 Diverticulitis of large intestine without perforation or abscess without bleeding: Secondary | ICD-10-CM | POA: Diagnosis not present

## 2022-07-04 DIAGNOSIS — E1122 Type 2 diabetes mellitus with diabetic chronic kidney disease: Secondary | ICD-10-CM | POA: Diagnosis not present

## 2022-07-05 ENCOUNTER — Ambulatory Visit (INDEPENDENT_AMBULATORY_CARE_PROVIDER_SITE_OTHER): Payer: Medicare HMO

## 2022-07-05 ENCOUNTER — Encounter: Payer: Self-pay | Admitting: Family Medicine

## 2022-07-05 ENCOUNTER — Ambulatory Visit (INDEPENDENT_AMBULATORY_CARE_PROVIDER_SITE_OTHER): Payer: Medicare HMO | Admitting: Family Medicine

## 2022-07-05 VITALS — BP 118/76 | HR 74 | Temp 97.8°F | Ht 71.0 in | Wt 232.2 lb

## 2022-07-05 DIAGNOSIS — I5020 Unspecified systolic (congestive) heart failure: Secondary | ICD-10-CM

## 2022-07-05 DIAGNOSIS — M542 Cervicalgia: Secondary | ICD-10-CM | POA: Diagnosis not present

## 2022-07-05 DIAGNOSIS — I25709 Atherosclerosis of coronary artery bypass graft(s), unspecified, with unspecified angina pectoris: Secondary | ICD-10-CM | POA: Diagnosis not present

## 2022-07-05 DIAGNOSIS — G2581 Restless legs syndrome: Secondary | ICD-10-CM

## 2022-07-05 DIAGNOSIS — R110 Nausea: Secondary | ICD-10-CM

## 2022-07-05 DIAGNOSIS — I252 Old myocardial infarction: Secondary | ICD-10-CM | POA: Diagnosis not present

## 2022-07-05 DIAGNOSIS — I480 Paroxysmal atrial fibrillation: Secondary | ICD-10-CM

## 2022-07-05 DIAGNOSIS — Z951 Presence of aortocoronary bypass graft: Secondary | ICD-10-CM | POA: Diagnosis not present

## 2022-07-05 DIAGNOSIS — F332 Major depressive disorder, recurrent severe without psychotic features: Secondary | ICD-10-CM

## 2022-07-05 DIAGNOSIS — E785 Hyperlipidemia, unspecified: Secondary | ICD-10-CM

## 2022-07-05 DIAGNOSIS — F419 Anxiety disorder, unspecified: Secondary | ICD-10-CM

## 2022-07-05 DIAGNOSIS — F5104 Psychophysiologic insomnia: Secondary | ICD-10-CM

## 2022-07-05 DIAGNOSIS — G8929 Other chronic pain: Secondary | ICD-10-CM

## 2022-07-05 DIAGNOSIS — R1084 Generalized abdominal pain: Secondary | ICD-10-CM | POA: Diagnosis not present

## 2022-07-05 DIAGNOSIS — I110 Hypertensive heart disease with heart failure: Secondary | ICD-10-CM

## 2022-07-05 DIAGNOSIS — I10 Essential (primary) hypertension: Secondary | ICD-10-CM

## 2022-07-05 DIAGNOSIS — K219 Gastro-esophageal reflux disease without esophagitis: Secondary | ICD-10-CM | POA: Diagnosis not present

## 2022-07-05 DIAGNOSIS — F411 Generalized anxiety disorder: Secondary | ICD-10-CM

## 2022-07-05 MED ORDER — ZOLPIDEM TARTRATE 5 MG PO TABS: 5.0000 mg | ORAL_TABLET | Freq: Every evening | ORAL | 2 refills | Status: DC | PRN

## 2022-07-05 MED ORDER — ROPINIROLE HCL 1 MG PO TABS
3.0000 mg | ORAL_TABLET | Freq: Every day | ORAL | 3 refills | Status: DC
Start: 2022-07-05 — End: 2023-02-28
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
## 2022-07-06 LAB — CBC WITH DIFFERENTIAL/PLATELET
Basophils Absolute: 0.1 10*3/uL (ref 0.0–0.2)
Basos: 1 %
EOS (ABSOLUTE): 0.5 10*3/uL — ABNORMAL HIGH (ref 0.0–0.4)
Eos: 4 %
Hematocrit: 50.2 % (ref 37.5–51.0)
Hemoglobin: 16.9 g/dL (ref 13.0–17.7)
Immature Grans (Abs): 0 10*3/uL (ref 0.0–0.1)
Immature Granulocytes: 0 %
Lymphocytes Absolute: 2.9 10*3/uL (ref 0.7–3.1)
Lymphs: 23 %
MCH: 32.3 pg (ref 26.6–33.0)
MCHC: 33.7 g/dL (ref 31.5–35.7)
MCV: 96 fL (ref 79–97)
Monocytes Absolute: 0.8 10*3/uL (ref 0.1–0.9)
Monocytes: 6 %
Neutrophils Absolute: 8.4 10*3/uL — ABNORMAL HIGH (ref 1.4–7.0)
Neutrophils: 66 %
Platelets: 298 10*3/uL (ref 150–450)
RBC: 5.24 x10E6/uL (ref 4.14–5.80)
RDW: 13.7 % (ref 11.6–15.4)
WBC: 12.6 10*3/uL — ABNORMAL HIGH (ref 3.4–10.8)

## 2022-07-06 LAB — CMP14+EGFR
ALT: 40 IU/L (ref 0–44)
AST: 27 IU/L (ref 0–40)
Albumin/Globulin Ratio: 1.6 (ref 1.2–2.2)
Albumin: 4.5 g/dL (ref 3.9–4.9)
Alkaline Phosphatase: 115 IU/L (ref 44–121)
BUN/Creatinine Ratio: 17 (ref 10–24)
BUN: 23 mg/dL (ref 8–27)
Bilirubin Total: 0.7 mg/dL (ref 0.0–1.2)
CO2: 20 mmol/L (ref 20–29)
Calcium: 10.1 mg/dL (ref 8.6–10.2)
Chloride: 101 mmol/L (ref 96–106)
Creatinine, Ser: 1.37 mg/dL — ABNORMAL HIGH (ref 0.76–1.27)
Globulin, Total: 2.9 g/dL (ref 1.5–4.5)
Glucose: 74 mg/dL (ref 70–99)
Potassium: 4.9 mmol/L (ref 3.5–5.2)
Sodium: 139 mmol/L (ref 134–144)
Total Protein: 7.4 g/dL (ref 6.0–8.5)
eGFR: 57 mL/min/{1.73_m2} — ABNORMAL LOW (ref >59)

## 2022-07-07 ENCOUNTER — Telehealth: Payer: Self-pay | Admitting: Family Medicine

## 2022-07-07 DIAGNOSIS — F419 Anxiety disorder, unspecified: Secondary | ICD-10-CM | POA: Diagnosis not present

## 2022-07-07 DIAGNOSIS — I5022 Chronic systolic (congestive) heart failure: Secondary | ICD-10-CM | POA: Diagnosis not present

## 2022-07-07 DIAGNOSIS — N1831 Chronic kidney disease, stage 3a: Secondary | ICD-10-CM | POA: Diagnosis not present

## 2022-07-07 DIAGNOSIS — J4489 Other specified chronic obstructive pulmonary disease: Secondary | ICD-10-CM | POA: Diagnosis not present

## 2022-07-07 DIAGNOSIS — I480 Paroxysmal atrial fibrillation: Secondary | ICD-10-CM | POA: Diagnosis not present

## 2022-07-07 DIAGNOSIS — I130 Hypertensive heart and chronic kidney disease with heart failure and stage 1 through stage 4 chronic kidney disease, or unspecified chronic kidney disease: Secondary | ICD-10-CM | POA: Diagnosis not present

## 2022-07-07 DIAGNOSIS — I2510 Atherosclerotic heart disease of native coronary artery without angina pectoris: Secondary | ICD-10-CM | POA: Diagnosis not present

## 2022-07-07 DIAGNOSIS — E1122 Type 2 diabetes mellitus with diabetic chronic kidney disease: Secondary | ICD-10-CM | POA: Diagnosis not present

## 2022-07-07 DIAGNOSIS — K5732 Diverticulitis of large intestine without perforation or abscess without bleeding: Secondary | ICD-10-CM | POA: Diagnosis not present

## 2022-07-10 NOTE — Telephone Encounter (Signed)
Calling to add that sometimes he gets light headed. At this time he is more concerned with mental health than his physical. He is not suicidal but sometimes questions why he is here. Support network is unclear per [place], she knows that he has a daughter and friends that help but unsure of how much.

## 2022-07-10 NOTE — Telephone Encounter (Signed)
I spoke with Kevin Smith and let her know about provider feedback and then tried to reach out to pt but no answer and lmtcb.

## 2022-07-11 ENCOUNTER — Encounter (INDEPENDENT_AMBULATORY_CARE_PROVIDER_SITE_OTHER): Payer: Self-pay | Admitting: *Deleted

## 2022-07-11 NOTE — Telephone Encounter (Signed)
I spoke to pt and advised him of provider feedback and recommendations and pt voiced understanding.

## 2022-07-12 ENCOUNTER — Ambulatory Visit (INDEPENDENT_AMBULATORY_CARE_PROVIDER_SITE_OTHER): Payer: Medicare HMO

## 2022-07-12 DIAGNOSIS — F419 Anxiety disorder, unspecified: Secondary | ICD-10-CM

## 2022-07-12 DIAGNOSIS — I719 Aortic aneurysm of unspecified site, without rupture: Secondary | ICD-10-CM

## 2022-07-12 DIAGNOSIS — J4489 Other specified chronic obstructive pulmonary disease: Secondary | ICD-10-CM

## 2022-07-12 DIAGNOSIS — N1831 Chronic kidney disease, stage 3a: Secondary | ICD-10-CM

## 2022-07-12 DIAGNOSIS — I2510 Atherosclerotic heart disease of native coronary artery without angina pectoris: Secondary | ICD-10-CM

## 2022-07-12 DIAGNOSIS — I130 Hypertensive heart and chronic kidney disease with heart failure and stage 1 through stage 4 chronic kidney disease, or unspecified chronic kidney disease: Secondary | ICD-10-CM | POA: Diagnosis not present

## 2022-07-12 DIAGNOSIS — F32A Depression, unspecified: Secondary | ICD-10-CM | POA: Diagnosis not present

## 2022-07-12 DIAGNOSIS — K5732 Diverticulitis of large intestine without perforation or abscess without bleeding: Secondary | ICD-10-CM | POA: Diagnosis not present

## 2022-07-12 DIAGNOSIS — I5022 Chronic systolic (congestive) heart failure: Secondary | ICD-10-CM | POA: Diagnosis not present

## 2022-07-12 DIAGNOSIS — I480 Paroxysmal atrial fibrillation: Secondary | ICD-10-CM

## 2022-07-19 DIAGNOSIS — I480 Paroxysmal atrial fibrillation: Secondary | ICD-10-CM | POA: Diagnosis not present

## 2022-07-19 DIAGNOSIS — J4489 Other specified chronic obstructive pulmonary disease: Secondary | ICD-10-CM | POA: Diagnosis not present

## 2022-07-19 DIAGNOSIS — I130 Hypertensive heart and chronic kidney disease with heart failure and stage 1 through stage 4 chronic kidney disease, or unspecified chronic kidney disease: Secondary | ICD-10-CM | POA: Diagnosis not present

## 2022-07-19 DIAGNOSIS — K5732 Diverticulitis of large intestine without perforation or abscess without bleeding: Secondary | ICD-10-CM | POA: Diagnosis not present

## 2022-07-19 DIAGNOSIS — F419 Anxiety disorder, unspecified: Secondary | ICD-10-CM | POA: Diagnosis not present

## 2022-07-19 DIAGNOSIS — I5022 Chronic systolic (congestive) heart failure: Secondary | ICD-10-CM | POA: Diagnosis not present

## 2022-07-19 DIAGNOSIS — I2510 Atherosclerotic heart disease of native coronary artery without angina pectoris: Secondary | ICD-10-CM | POA: Diagnosis not present

## 2022-07-19 DIAGNOSIS — N1831 Chronic kidney disease, stage 3a: Secondary | ICD-10-CM | POA: Diagnosis not present

## 2022-07-19 DIAGNOSIS — E1122 Type 2 diabetes mellitus with diabetic chronic kidney disease: Secondary | ICD-10-CM | POA: Diagnosis not present

## 2022-07-23 DIAGNOSIS — K5732 Diverticulitis of large intestine without perforation or abscess without bleeding: Secondary | ICD-10-CM | POA: Diagnosis not present

## 2022-07-23 DIAGNOSIS — E1122 Type 2 diabetes mellitus with diabetic chronic kidney disease: Secondary | ICD-10-CM | POA: Diagnosis not present

## 2022-07-23 DIAGNOSIS — I130 Hypertensive heart and chronic kidney disease with heart failure and stage 1 through stage 4 chronic kidney disease, or unspecified chronic kidney disease: Secondary | ICD-10-CM | POA: Diagnosis not present

## 2022-07-23 DIAGNOSIS — I5022 Chronic systolic (congestive) heart failure: Secondary | ICD-10-CM | POA: Diagnosis not present

## 2022-07-23 DIAGNOSIS — N1831 Chronic kidney disease, stage 3a: Secondary | ICD-10-CM | POA: Diagnosis not present

## 2022-07-23 DIAGNOSIS — F419 Anxiety disorder, unspecified: Secondary | ICD-10-CM | POA: Diagnosis not present

## 2022-07-23 DIAGNOSIS — I480 Paroxysmal atrial fibrillation: Secondary | ICD-10-CM | POA: Diagnosis not present

## 2022-07-23 DIAGNOSIS — I2510 Atherosclerotic heart disease of native coronary artery without angina pectoris: Secondary | ICD-10-CM | POA: Diagnosis not present

## 2022-07-23 DIAGNOSIS — J4489 Other specified chronic obstructive pulmonary disease: Secondary | ICD-10-CM | POA: Diagnosis not present

## 2022-08-17 DIAGNOSIS — I5022 Chronic systolic (congestive) heart failure: Secondary | ICD-10-CM | POA: Diagnosis not present

## 2022-08-17 DIAGNOSIS — F419 Anxiety disorder, unspecified: Secondary | ICD-10-CM | POA: Diagnosis not present

## 2022-08-17 DIAGNOSIS — E1122 Type 2 diabetes mellitus with diabetic chronic kidney disease: Secondary | ICD-10-CM | POA: Diagnosis not present

## 2022-08-17 DIAGNOSIS — I480 Paroxysmal atrial fibrillation: Secondary | ICD-10-CM | POA: Diagnosis not present

## 2022-08-17 DIAGNOSIS — J4489 Other specified chronic obstructive pulmonary disease: Secondary | ICD-10-CM | POA: Diagnosis not present

## 2022-08-17 DIAGNOSIS — N1831 Chronic kidney disease, stage 3a: Secondary | ICD-10-CM | POA: Diagnosis not present

## 2022-08-17 DIAGNOSIS — K5732 Diverticulitis of large intestine without perforation or abscess without bleeding: Secondary | ICD-10-CM | POA: Diagnosis not present

## 2022-08-17 DIAGNOSIS — I2510 Atherosclerotic heart disease of native coronary artery without angina pectoris: Secondary | ICD-10-CM | POA: Diagnosis not present

## 2022-08-17 DIAGNOSIS — I130 Hypertensive heart and chronic kidney disease with heart failure and stage 1 through stage 4 chronic kidney disease, or unspecified chronic kidney disease: Secondary | ICD-10-CM | POA: Diagnosis not present

## 2022-09-11 ENCOUNTER — Ambulatory Visit (INDEPENDENT_AMBULATORY_CARE_PROVIDER_SITE_OTHER): Payer: Medicare HMO | Admitting: Gastroenterology

## 2022-09-18 ENCOUNTER — Other Ambulatory Visit: Payer: Self-pay | Admitting: *Deleted

## 2022-09-18 DIAGNOSIS — J4489 Other specified chronic obstructive pulmonary disease: Secondary | ICD-10-CM

## 2022-09-18 DIAGNOSIS — J439 Emphysema, unspecified: Secondary | ICD-10-CM

## 2022-09-18 DIAGNOSIS — J441 Chronic obstructive pulmonary disease with (acute) exacerbation: Secondary | ICD-10-CM

## 2022-09-18 MED ORDER — VENTOLIN HFA 108 (90 BASE) MCG/ACT IN AERS
2.0000 | INHALATION_SPRAY | Freq: Four times a day (QID) | RESPIRATORY_TRACT | 2 refills | Status: DC | PRN
Start: 2022-09-18 — End: 2023-01-01

## 2022-10-05 ENCOUNTER — Ambulatory Visit: Payer: Medicare HMO | Admitting: Nurse Practitioner

## 2022-10-25 ENCOUNTER — Other Ambulatory Visit: Payer: Self-pay | Admitting: Family Medicine

## 2022-10-25 DIAGNOSIS — F5104 Psychophysiologic insomnia: Secondary | ICD-10-CM

## 2023-01-01 ENCOUNTER — Other Ambulatory Visit: Payer: Self-pay | Admitting: Family Medicine

## 2023-01-01 DIAGNOSIS — J439 Emphysema, unspecified: Secondary | ICD-10-CM

## 2023-01-01 DIAGNOSIS — J441 Chronic obstructive pulmonary disease with (acute) exacerbation: Secondary | ICD-10-CM

## 2023-01-09 ENCOUNTER — Ambulatory Visit: Payer: Medicare HMO

## 2023-01-09 VITALS — Ht 71.0 in | Wt 231.0 lb

## 2023-01-09 DIAGNOSIS — Z0000 Encounter for general adult medical examination without abnormal findings: Secondary | ICD-10-CM | POA: Diagnosis not present

## 2023-01-09 NOTE — Patient Instructions (Signed)
Mr. Billa , Thank you for taking time to come for your Medicare Wellness Visit. I appreciate your ongoing commitment to your health goals. Please review the following plan we discussed and let me know if I can assist you in the future.   Referrals/Orders/Follow-Ups/Clinician Recommendations: Aim for 30 minutes of exercise or brisk walking, 6-8 glasses of water, and 5 servings of fruits and vegetables each day.   This is a list of the screening recommended for you and due dates:  Health Maintenance  Topic Date Due   Pneumonia Vaccine (1 of 2 - PCV) Never done   COVID-19 Vaccine (1) 04/27/2023*   Flu Shot  06/11/2023*   Colon Cancer Screening  07/05/2023*   Zoster (Shingles) Vaccine (1 of 2) 07/20/2023*   Screening for Lung Cancer  06/12/2023   DTaP/Tdap/Td vaccine (2 - Td or Tdap) 01/07/2024   Medicare Annual Wellness Visit  01/09/2024   Hepatitis C Screening  Completed   HPV Vaccine  Aged Out  *Topic was postponed. The date shown is not the original due date.    Advanced directives: (Provided) Advance directive discussed with you today. I have provided a copy for you to complete at home and have notarized. Once this is complete, please bring a copy in to our office so we can scan it into your chart. Information on Advanced Care Planning can be found at [facility] Advance Health Care Directives Advance Health Care Directives ([email or web address]    Next Medicare Annual Wellness Visit scheduled for next year: Yes  insert Preventive Care attachment Insert FALL PREVENTION attachment if needed
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
## 2023-01-22 ENCOUNTER — Other Ambulatory Visit: Payer: Self-pay | Admitting: Family Medicine

## 2023-01-22 DIAGNOSIS — I214 Non-ST elevation (NSTEMI) myocardial infarction: Secondary | ICD-10-CM

## 2023-01-23 ENCOUNTER — Other Ambulatory Visit: Payer: Self-pay | Admitting: *Deleted

## 2023-01-23 DIAGNOSIS — I214 Non-ST elevation (NSTEMI) myocardial infarction: Secondary | ICD-10-CM

## 2023-01-23 MED ORDER — FUROSEMIDE 40 MG PO TABS: 40.0000 mg | ORAL_TABLET | Freq: Every day | ORAL | 0 refills | Status: DC

## 2023-02-20 ENCOUNTER — Other Ambulatory Visit: Payer: Self-pay | Admitting: Family Medicine

## 2023-02-20 DIAGNOSIS — I214 Non-ST elevation (NSTEMI) myocardial infarction: Secondary | ICD-10-CM

## 2023-02-20 NOTE — Telephone Encounter (Signed)
Kevin Smith pt NTBS 30-d given 01/23/23

## 2023-02-26 ENCOUNTER — Ambulatory Visit: Payer: Medicare HMO | Admitting: Family Medicine

## 2023-02-28 ENCOUNTER — Ambulatory Visit: Payer: Medicare HMO | Admitting: Family Medicine

## 2023-02-28 ENCOUNTER — Encounter: Payer: Self-pay | Admitting: Family Medicine

## 2023-02-28 VITALS — BP 127/82 | HR 76 | Temp 98.7°F | Ht 71.0 in | Wt 220.6 lb

## 2023-02-28 DIAGNOSIS — I10 Essential (primary) hypertension: Secondary | ICD-10-CM

## 2023-02-28 DIAGNOSIS — J4489 Other specified chronic obstructive pulmonary disease: Secondary | ICD-10-CM

## 2023-02-28 DIAGNOSIS — Z951 Presence of aortocoronary bypass graft: Secondary | ICD-10-CM | POA: Diagnosis not present

## 2023-02-28 DIAGNOSIS — I5020 Unspecified systolic (congestive) heart failure: Secondary | ICD-10-CM

## 2023-02-28 DIAGNOSIS — N1831 Chronic kidney disease, stage 3a: Secondary | ICD-10-CM

## 2023-02-28 DIAGNOSIS — I480 Paroxysmal atrial fibrillation: Secondary | ICD-10-CM

## 2023-02-28 DIAGNOSIS — F332 Major depressive disorder, recurrent severe without psychotic features: Secondary | ICD-10-CM

## 2023-02-28 DIAGNOSIS — E785 Hyperlipidemia, unspecified: Secondary | ICD-10-CM | POA: Diagnosis not present

## 2023-02-28 DIAGNOSIS — N401 Enlarged prostate with lower urinary tract symptoms: Secondary | ICD-10-CM

## 2023-02-28 DIAGNOSIS — I25709 Atherosclerosis of coronary artery bypass graft(s), unspecified, with unspecified angina pectoris: Secondary | ICD-10-CM

## 2023-02-28 DIAGNOSIS — K219 Gastro-esophageal reflux disease without esophagitis: Secondary | ICD-10-CM | POA: Diagnosis not present

## 2023-02-28 DIAGNOSIS — F1010 Alcohol abuse, uncomplicated: Secondary | ICD-10-CM

## 2023-02-28 LAB — LIPID PANEL

## 2023-02-28 MED ORDER — VENTOLIN HFA 108 (90 BASE) MCG/ACT IN AERS
2.0000 | INHALATION_SPRAY | Freq: Four times a day (QID) | RESPIRATORY_TRACT | 0 refills | Status: DC | PRN
Start: 2023-02-28 — End: 2023-04-26

## 2023-02-28 MED ORDER — EZETIMIBE 10 MG PO TABS: 10.0000 mg | ORAL_TABLET | Freq: Every day | ORAL | 3 refills | Status: DC

## 2023-02-28 MED ORDER — ATORVASTATIN CALCIUM 80 MG PO TABS
80.0000 mg | ORAL_TABLET | Freq: Every day | ORAL | 3 refills | Status: DC
Start: 2023-02-28 — End: 2023-04-26

## 2023-02-28 MED ORDER — DESVENLAFAXINE SUCCINATE ER 100 MG PO TB24: 100.0000 mg | ORAL_TABLET | Freq: Every day | ORAL | 3 refills | Status: DC

## 2023-02-28 MED ORDER — PANTOPRAZOLE SODIUM 40 MG PO TBEC
40.0000 mg | DELAYED_RELEASE_TABLET | Freq: Two times a day (BID) | ORAL | 3 refills | Status: DC
Start: 2023-02-28 — End: 2023-04-26

## 2023-02-28 MED ORDER — TAMSULOSIN HCL 0.4 MG PO CAPS
0.4000 mg | ORAL_CAPSULE | Freq: Every day | ORAL | 3 refills | Status: DC
Start: 2023-02-28 — End: 2023-04-26

## 2023-02-28 MED ORDER — METOPROLOL SUCCINATE ER 50 MG PO TB24
50.0000 mg | ORAL_TABLET | Freq: Every day | ORAL | 11 refills | Status: DC
Start: 2023-02-28 — End: 2023-02-28

## 2023-02-28 MED ORDER — ALBUTEROL SULFATE (2.5 MG/3ML) 0.083% IN NEBU
2.5000 mg | INHALATION_SOLUTION | RESPIRATORY_TRACT | 12 refills | Status: DC | PRN
Start: 2023-02-28 — End: 2023-04-26

## 2023-02-28 MED ORDER — BUSPIRONE HCL 10 MG PO TABS
10.0000 mg | ORAL_TABLET | Freq: Two times a day (BID) | ORAL | 3 refills | Status: DC
Start: 2023-02-28 — End: 2023-04-26

## 2023-02-28 MED ORDER — CLOPIDOGREL BISULFATE 75 MG PO TABS
75.0000 mg | ORAL_TABLET | Freq: Every day | ORAL | 3 refills | Status: DC
Start: 2023-02-28 — End: 2023-04-26

## 2023-02-28 MED ORDER — METOPROLOL SUCCINATE ER 50 MG PO TB24: 50.0000 mg | ORAL_TABLET | Freq: Every day | ORAL | 3 refills | Status: DC

## 2023-02-28 MED ORDER — FUROSEMIDE 40 MG PO TABS
40.0000 mg | ORAL_TABLET | Freq: Every day | ORAL | 1 refills | Status: DC
Start: 2023-02-28 — End: 2023-04-26

## 2023-02-28 NOTE — Assessment & Plan Note (Signed)
Patient stopped with amiodarone and eliquis and is not interested in restarting. Regular rhythm and rate today.

## 2023-02-28 NOTE — Assessment & Plan Note (Signed)
On statin and zetia 

## 2023-02-28 NOTE — Assessment & Plan Note (Signed)
Stable with pristiq and buspar. Denies SI. Declined referral due to cost.

## 2023-02-28 NOTE — Assessment & Plan Note (Signed)
Drinks 12 beers 3x a week. Declined treatment

## 2023-02-28 NOTE — Progress Notes (Signed)
Established Patient Office Visit  Subjective   Patient ID: Kevin Smith, male    DOB: 21-Sep-1955  Age: 67 y.o. MRN: [id number]  Chief Complaint  Patient presents with   Medical Management of Chronic Issues    HPI  Cardio He stopped taking amidorone and eliquis. Reports that he feels better without these medicines as they were making her very sick. He denies palpitations. He has had some chest pain that feels like a heaviness in his chest that radiates down to his left arm. This occurred 12 different times over a period of 6 days 2 weeks ago. He has felt tired and reprots dyspnea with exertions. Denies edema, changes in vision. Reports chronic dizziness with standing, head movement. .  He has been compliant with lasix. He has been having urinary frequency and nocturia. HX of BPH with LUTS. Hasn't been on flomax  2. HLD He has been compliant with statin.   3. COPD Having to use his nebulizer TID. Using Breztri BID. Has wheezing throughout day.   4. CKD Not taking jardiance.   5. GERD Compliant with medications - Yes Current medications - protonix  Sore throat - No Voice change - No Hemoptysis - No Dysphagia or dyspepsia - No Water brash - No Red Flags (weight loss, hematochezia, melena, weight loss, early satiety, fevers, odynophagia, or persistent vomiting) - No  6. Anxiety/depression/sleep Compliant with pristiq and buspar. Depression is stable, improved from previous. Not able to afford specialist. Using [facility] and alcohol. Drinks 12 beers 3 days a week. Isn't need to taking medication to help with sleep anymore. Also isn't struggling with restless leg symptoms anymore.      02/28/2023    1:32 PM 01/09/2023    3:07 PM 07/05/2022    2:53 PM  Depression screen PHQ 2/9  Decreased Interest 1 0 1  Down, Depressed, Hopeless 0 0 2  PHQ - 2 Score 1 0 3  Altered sleeping 3  3  Tired, decreased energy 1  1  Change in appetite 2  3  Feeling bad or failure about yourself  0  3   Trouble concentrating 0  1  Moving slowly or fidgety/restless 0  1  Suicidal thoughts 0  0  PHQ-9 Score 7  15  Difficult doing work/chores Not difficult at all  Very difficult      02/28/2023    1:33 PM 07/05/2022    2:54 PM 06/22/2022    1:23 PM 05/24/2022    4:12 PM  GAD 7 : Generalized Anxiety Score  Nervous, Anxious, on Edge 1 3 1 3   Control/stop worrying 1 3 2 3   Worry too much - different things 3 3 2 3   Trouble relaxing 2 3 3 1   Restless 1 3 3 1   Easily annoyed or irritable 1 1 3 1   Afraid - awful might happen 2 3 3 3   Total GAD 7 Score 11 19 17 15   Anxiety Difficulty Somewhat difficult Very difficult Very difficult Very difficult         ROS Negative unless specially indicated above in HPI.   Objective:     BP 127/82   Pulse 76   Temp 98.7 F (37.1 C) (Temporal)   Ht 5\' 11"  (1.803 m)   Wt 220 lb 9.6 oz (100.1 kg)   SpO2 96%   BMI 30.77 kg/m    Physical Exam Vitals and nursing note reviewed.  Constitutional:      General: He is not in acute  distress.    Appearance: Normal appearance. He is not ill-appearing.  HENT:     Nose: Nose normal.     Mouth/Throat:     Mouth: Mucous membranes are moist.     Pharynx: Oropharynx is clear.  Cardiovascular:     Rate and Rhythm: Normal rate and regular rhythm.     Pulses: Normal pulses.     Heart sounds: Normal heart sounds. No murmur heard. Pulmonary:     Effort: Pulmonary effort is normal. No respiratory distress.     Breath sounds: Normal breath sounds.  Abdominal:     General: Bowel sounds are normal. There is no distension.     Palpations: Abdomen is soft. There is no mass.     Tenderness: There is no abdominal tenderness. There is no guarding or rebound.  Musculoskeletal:     Cervical back: Neck supple. No tenderness.     Right lower leg: No edema.     Left lower leg: No edema.  Lymphadenopathy:     Cervical: No cervical adenopathy.  Skin:    General: Skin is warm and dry.  Neurological:      General: No focal deficit present.     Mental Status: He is alert and oriented to person, place, and time.     Motor: No weakness.     Gait: Gait normal.  Psychiatric:        Mood and Affect: Mood normal.        Behavior: Behavior normal.        Thought Content: Thought content normal.        Judgment: Judgment normal.      No results found for any visits on 02/28/23.    The ASCVD Risk score (Arnett DK, et al., 2019) failed to calculate for the following reasons:   Risk score cannot be calculated because patient has a medical history suggesting prior/existing ASCVD    Assessment & Plan:   Primary hypertension Assessment & Plan: BP at goal. On metoprolol and lasix.  Orders: -     CMP14+EGFR -     CBC with Differential/Platelet -     TSH -     EKG 12-Lead -     Metoprolol Succinate ER; Take 1 tablet (50 mg total) by mouth daily.  Dispense: 90 tablet; Refill: 3 -     Ambulatory referral to Cardiology  Coronary artery disease involving 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 [facility]) Assessment & Plan: Reporting chest pain with radiation to left arm 2 weeks ago. Urgent referral back to cardiology discussed and placed. EKG today with no acute changes from previous. Compliant with statin and zetia. S/P stent. On plavix.  Orders: -     Atorvastatin Calcium; Take 1 tablet (80 mg total) by mouth daily.  Dispense: 90 tablet; Refill: 3 -     Clopidogrel Bisulfate; Take 1 tablet (75 mg total) by mouth daily.  Dispense: 90 tablet; Refill: 3 -     Ezetimibe; Take 1 tablet (10 mg total) by mouth daily.  Dispense: 90 tablet; Refill: 3 -     EKG 12-Lead -     Ambulatory referral to Cardiology  S/P CABG x 3 -     Clopidogrel Bisulfate; Take 1 tablet (75 mg total) by mouth daily.  Dispense: 90 tablet; Refill: 3 -     Ambulatory referral to Cardiology  HFrEF (heart failure with reduced ejection fraction) (HCC) Assessment & Plan: On lasix. Euvolemic on  exam.  Orders: -     Furosemide; Take 1 tablet (40 mg total) by mouth daily.  Dispense: 90 tablet; Refill: 1 -     Ambulatory referral to Cardiology  Paroxysmal atrial fibrillation [facility]) Assessment & Plan: Patient stopped with amiodarone and eliquis and is not interested in restarting. Regular rhythm and rate today.   Orders: -     Metoprolol Succinate ER; Take 1 tablet (50 mg total) by mouth daily.  Dispense: 90 tablet; Refill: 3 -     Ambulatory referral to Cardiology  Hyperlipidemia with target LDL less than 70 Assessment & Plan: On statin and zetia.   Orders: -     Ezetimibe; Take 1 tablet (10 mg total) by mouth daily.  Dispense: 90 tablet; Refill: 3 -     Lipid panel  Gastroesophageal reflux disease without esophagitis Assessment & Plan: Well controlled with protonix BID.  Orders: -     Atorvastatin Calcium; Take 1 tablet (80 mg total) by mouth daily.  Dispense: 90 tablet; Refill: 3 -     Pantoprazole Sodium; Take 1 tablet (40 mg total) by mouth 2 (two) times daily.  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 Denies SI. Declined referral due to cost.  Orders: -     busPIRone HCl; Take 1 tablet (10 mg total) by mouth 2 (two) times daily.  Dispense: 180 tablet; Refill: 3 -     Desvenlafaxine Succinate ER; Take 1 tablet (100 mg total) by mouth daily.  Dispense: 90 tablet; Refill: 3  COPD with chronic bronchitis and emphysema (HCC) Assessment & Plan: Not well controlled. Compliant with breztri. Using nebulzier TID for wheezing. Referral back to pulmonology placed. Discussed need for smoking cessation.   Orders: -     Albuterol Sulfate; Take 3 mLs (2.5 mg total) by nebulization every 4 (four) hours as needed for wheezing or shortness of breath.  Dispense: 75 mL; Refill: 12 -     Ventolin HFA; Inhale 2 puffs into the lungs every 6 (six) hours as needed for wheezing or  shortness of breath. **NEEDS TO BE SEEN BEFORE NEXT REFILL**  Dispense: 18 g; Refill: 0 -     Ambulatory referral to Pulmonology  Stage 3a chronic kidney disease (HCC) Assessment & Plan: Patient stopped jardiance. Avoid NSAIDs.   Orders: -     VITAMIN D 25 Hydroxy (Vit-D Deficiency, Fractures)  Benign prostatic hyperplasia with urinary frequency Assessment & Plan: Reporting nocturia and urinary frequency. Restart flomax.   Orders: -     Tamsulosin HCl; Take 1 capsule (0.4 mg total) by mouth daily after supper.  Dispense: 90 capsule; Refill: 3 -     PSA, total and free  Alcohol abuse Assessment & Plan: Drinks 12 beers 3x a week. Declined treatment  Orders: -     Vitamin B12 -     Folate -     Vitamin B1 -     Magnesium    Total time spent caring for the patient today was 59 minutes. This includes time spent before the visit reviewing the chart, time spent during the visit, and time spent after the visit on documentation    Return in about 3 months (around 05/29/2023) for chronic follow up. Sooner for new or worsening symptoms.   The patient indicates understanding of these issues and agrees with the plan.  Gabriel Earing, FNP

## 2023-02-28 NOTE — Assessment & Plan Note (Addendum)
Patient stopped jardiance. Avoid NSAIDs.

## 2023-02-28 NOTE — Assessment & Plan Note (Signed)
BP at goal. On metoprolol and lasix.

## 2023-02-28 NOTE — Assessment & Plan Note (Addendum)
Reporting chest pain with radiation to left arm 2 weeks ago. Urgent referral back to cardiology discussed and placed. EKG today with no acute changes from previous. Compliant with statin and zetia. S/P stent. On plavix.

## 2023-02-28 NOTE — Assessment & Plan Note (Signed)
Well controlled with protonix BID.

## 2023-02-28 NOTE — Assessment & Plan Note (Signed)
Not well controlled. Compliant with breztri. Using nebulzier TID for wheezing. Referral back to pulmonology placed. Discussed need for smoking cessation.

## 2023-02-28 NOTE — Assessment & Plan Note (Signed)
On lasix. Euvolemic on exam.

## 2023-02-28 NOTE — Assessment & Plan Note (Signed)
Reporting nocturia and urinary frequency. Restart flomax.

## 2023-03-02 ENCOUNTER — Other Ambulatory Visit: Payer: Self-pay | Admitting: Family Medicine

## 2023-03-02 ENCOUNTER — Other Ambulatory Visit: Payer: Self-pay | Admitting: *Deleted

## 2023-03-02 DIAGNOSIS — E559 Vitamin D deficiency, unspecified: Secondary | ICD-10-CM | POA: Insufficient documentation

## 2023-03-02 DIAGNOSIS — D582 Other hemoglobinopathies: Secondary | ICD-10-CM

## 2023-03-02 DIAGNOSIS — E785 Hyperlipidemia, unspecified: Secondary | ICD-10-CM

## 2023-03-02 DIAGNOSIS — E875 Hyperkalemia: Secondary | ICD-10-CM

## 2023-03-02 MED ORDER — VITAMIN D (ERGOCALCIFEROL) 1.25 MG (50000 UNIT) PO CAPS: 50000.0000 [IU] | ORAL_CAPSULE | ORAL | 0 refills | Status: DC

## 2023-03-05 LAB — CBC WITH DIFFERENTIAL/PLATELET
Basophils Absolute: 0.1 10*3/uL (ref 0.0–0.2)
Basos: 1 %
EOS (ABSOLUTE): 0.5 10*3/uL — ABNORMAL HIGH (ref 0.0–0.4)
Eos: 6 %
Hematocrit: 51.9 % — ABNORMAL HIGH (ref 37.5–51.0)
Hemoglobin: 18.1 g/dL — ABNORMAL HIGH (ref 13.0–17.7)
Immature Grans (Abs): 0 10*3/uL (ref 0.0–0.1)
Immature Granulocytes: 0 %
Lymphocytes Absolute: 3.1 10*3/uL (ref 0.7–3.1)
Lymphs: 36 %
MCH: 33.4 pg — ABNORMAL HIGH (ref 26.6–33.0)
MCHC: 34.9 g/dL (ref 31.5–35.7)
MCV: 96 fL (ref 79–97)
Monocytes Absolute: 0.6 10*3/uL (ref 0.1–0.9)
Monocytes: 7 %
Neutrophils Absolute: 4.3 10*3/uL (ref 1.4–7.0)
Neutrophils: 50 %
Platelets: 263 10*3/uL (ref 150–450)
RBC: 5.42 x10E6/uL (ref 4.14–5.80)
RDW: 12.1 % (ref 11.6–15.4)
WBC: 8.6 10*3/uL (ref 3.4–10.8)

## 2023-03-05 LAB — CMP14+EGFR
ALT: 32 [IU]/L (ref 0–44)
AST: 30 [IU]/L (ref 0–40)
Albumin: 4.7 g/dL (ref 3.9–4.9)
Alkaline Phosphatase: 99 [IU]/L (ref 44–121)
BUN/Creatinine Ratio: 14 (ref 10–24)
BUN: 19 mg/dL (ref 8–27)
Bilirubin Total: 0.5 mg/dL (ref 0.0–1.2)
CO2: 25 mmol/L (ref 20–29)
Calcium: 10.3 mg/dL — ABNORMAL HIGH (ref 8.6–10.2)
Chloride: 98 mmol/L (ref 96–106)
Creatinine, Ser: 1.36 mg/dL — ABNORMAL HIGH (ref 0.76–1.27)
Globulin, Total: 2.8 g/dL (ref 1.5–4.5)
Glucose: 88 mg/dL (ref 70–99)
Potassium: 4.9 mmol/L (ref 3.5–5.2)
Sodium: 141 mmol/L (ref 134–144)
Total Protein: 7.5 g/dL (ref 6.0–8.5)
eGFR: 57 mL/min/{1.73_m2} — ABNORMAL LOW (ref >59)

## 2023-03-05 LAB — LIPID PANEL
Chol/HDL Ratio: 2.9 {ratio} (ref 0.0–5.0)
Cholesterol, Total: 190 mg/dL (ref 100–199)
HDL: 65 mg/dL (ref >39)
LDL Chol Calc (NIH): 91 mg/dL (ref 0–99)
Triglycerides: 200 mg/dL — ABNORMAL HIGH (ref 0–149)
VLDL Cholesterol Cal: 34 mg/dL (ref 5–40)

## 2023-03-05 LAB — PSA, TOTAL AND FREE
PSA, Free Pct: 23.9 %
PSA, Free: 0.67 ng/mL
Prostate Specific Ag, Serum: 2.8 ng/mL (ref 0.0–4.0)

## 2023-03-05 LAB — VITAMIN D 25 HYDROXY (VIT D DEFICIENCY, FRACTURES): Vit D, 25-Hydroxy: 11.9 ng/mL — ABNORMAL LOW (ref 30.0–100.0)

## 2023-03-05 LAB — VITAMIN B1: Thiamine: 90.7 nmol/L (ref 66.5–200.0)

## 2023-03-05 LAB — FOLATE: Folate: 6.4 ng/mL (ref >3.0)

## 2023-03-05 LAB — TSH: TSH: 3.55 u[IU]/mL (ref 0.450–4.500)

## 2023-03-05 LAB — MAGNESIUM: Magnesium: 1.9 mg/dL (ref 1.6–2.3)

## 2023-03-05 LAB — VITAMIN B12: Vitamin B-12: 496 pg/mL (ref 232–1245)

## 2023-03-09 ENCOUNTER — Other Ambulatory Visit: Payer: Medicare HMO

## 2023-03-09 DIAGNOSIS — D582 Other hemoglobinopathies: Secondary | ICD-10-CM

## 2023-03-09 DIAGNOSIS — E785 Hyperlipidemia, unspecified: Secondary | ICD-10-CM

## 2023-03-09 LAB — LIPID PANEL

## 2023-03-10 LAB — CBC WITH DIFFERENTIAL/PLATELET
Basophils Absolute: 0.1 10*3/uL (ref 0.0–0.2)
Basos: 1 %
EOS (ABSOLUTE): 0.3 10*3/uL (ref 0.0–0.4)
Eos: 4 %
Hematocrit: 51.4 % — ABNORMAL HIGH (ref 37.5–51.0)
Hemoglobin: 17.6 g/dL (ref 13.0–17.7)
Immature Grans (Abs): 0 10*3/uL (ref 0.0–0.1)
Immature Granulocytes: 0 %
Lymphocytes Absolute: 2.8 10*3/uL (ref 0.7–3.1)
Lymphs: 35 %
MCH: 33.1 pg — ABNORMAL HIGH (ref 26.6–33.0)
MCHC: 34.2 g/dL (ref 31.5–35.7)
MCV: 97 fL (ref 79–97)
Monocytes Absolute: 0.5 10*3/uL (ref 0.1–0.9)
Monocytes: 6 %
Neutrophils Absolute: 4.3 10*3/uL (ref 1.4–7.0)
Neutrophils: 54 %
Platelets: 257 10*3/uL (ref 150–450)
RBC: 5.32 x10E6/uL (ref 4.14–5.80)
RDW: 11.7 % (ref 11.6–15.4)
WBC: 7.9 10*3/uL (ref 3.4–10.8)

## 2023-03-10 LAB — BMP8+EGFR
BUN/Creatinine Ratio: 12 (ref 10–24)
BUN: 20 mg/dL (ref 8–27)
CO2: 24 mmol/L (ref 20–29)
Calcium: 10 mg/dL (ref 8.6–10.2)
Chloride: 100 mmol/L (ref 96–106)
Creatinine, Ser: 1.64 mg/dL — ABNORMAL HIGH (ref 0.76–1.27)
Glucose: 115 mg/dL — ABNORMAL HIGH (ref 70–99)
Potassium: 5.7 mmol/L — ABNORMAL HIGH (ref 3.5–5.2)
Sodium: 142 mmol/L (ref 134–144)
eGFR: 46 mL/min/{1.73_m2} — ABNORMAL LOW (ref >59)

## 2023-03-10 LAB — LIPID PANEL
Cholesterol, Total: 177 mg/dL (ref 100–199)
HDL: 60 mg/dL (ref >39)
LDL CALC COMMENT:: 3 ratio (ref 0.0–5.0)
LDL Chol Calc (NIH): 94 mg/dL (ref 0–99)
Triglycerides: 134 mg/dL (ref 0–149)
VLDL Cholesterol Cal: 23 mg/dL (ref 5–40)

## 2023-03-12 MED ORDER — NEXLIZET 180-10 MG PO TABS: ORAL_TABLET | ORAL | 3 refills | Status: DC

## 2023-03-12 NOTE — Addendum Note (Signed)
Addended by: Hessie Diener on: 03/12/2023 03:50 PM   Modules accepted: Orders

## 2023-03-19 ENCOUNTER — Other Ambulatory Visit: Payer: Medicare HMO

## 2023-03-20 ENCOUNTER — Telehealth: Payer: Self-pay

## 2023-03-20 NOTE — Telephone Encounter (Signed)
 Kevin Smith (KeyBettey Mare) PA Case ID #: [id number] Rx #: [id number] Need Help? Call us at [phone] Status sent iconSent to Plan today Drug Nexlizet 180-10MG  tablets ePA cloud logo Form [facility] Electronic PA Form Original Claim Info [phone]

## 2023-03-20 NOTE — Telephone Encounter (Signed)
 Pharmacy Patient Advocate Encounter  Received notification from [facility] that Prior Authorization for Nexlizet 180-10MG  tablets has been APPROVED from 03/14/23 to 03/12/24   PA #/Case ID/Reference #: [id number]

## 2023-04-04 ENCOUNTER — Telehealth: Payer: Self-pay

## 2023-04-04 NOTE — Telephone Encounter (Signed)
Copied from CRM [id number]. Topic: General - Other >> Apr 04, 2023  1:51 PM Geroge Baseman wrote: Reason for CRM: Insurance states that they need information about alerts for him not being on a certain recommend medication for a diagnosis, checking to see if he needs an ace inhibitors or ARB chs. Faxing copies of these alerts to office. [phone]. Milus Banister for questions
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
## 2023-04-17 ENCOUNTER — Ambulatory Visit (HOSPITAL_COMMUNITY)
Admission: RE | Admit: 2023-04-17 | Discharge: 2023-04-17 | Disposition: A | Discharge status: Home / Self Care | Payer: Medicare HMO | Source: Ambulatory Visit | Attending: Internal Medicine | Admitting: Internal Medicine

## 2023-04-17 ENCOUNTER — Ambulatory Visit: Payer: Medicare HMO | Admitting: Internal Medicine

## 2023-04-17 ENCOUNTER — Encounter: Payer: Self-pay | Admitting: Internal Medicine

## 2023-04-17 VITALS — BP 117/78 | HR 79 | Ht 71.0 in | Wt 222.0 lb

## 2023-04-17 DIAGNOSIS — R058 Other specified cough: Secondary | ICD-10-CM

## 2023-04-17 DIAGNOSIS — Z951 Presence of aortocoronary bypass graft: Secondary | ICD-10-CM | POA: Diagnosis not present

## 2023-04-17 DIAGNOSIS — R0609 Other forms of dyspnea: Secondary | ICD-10-CM | POA: Diagnosis not present

## 2023-04-17 DIAGNOSIS — J449 Chronic obstructive pulmonary disease, unspecified: Secondary | ICD-10-CM

## 2023-04-17 DIAGNOSIS — R059 Cough, unspecified: Secondary | ICD-10-CM | POA: Diagnosis not present

## 2023-04-17 MED ORDER — GABAPENTIN 100 MG PO CAPS: 100.0000 mg | ORAL_CAPSULE | Freq: Three times a day (TID) | ORAL | 2 refills | Status: DC

## 2023-04-17 NOTE — Assessment & Plan Note (Signed)
 Onset ? 2006 with dx copd /gerd -   Eos 0.1 /  IgE  382> referred to allergy 10/12/2021 and added pepcid  20 mg q pm to am ppi > did not do either  -   02/01/2022 added h2 hs and rec trial off entresto  as pt having   cough syncope > no change but did not return for next step - 04/17/2023 rec bid ppi ac and gabapentin  titrated to max of 300  mg tid and f/u in 4 weeks  Of the three most common causes of  Sub-acute / recurrent or chronic cough, only one (GERD)  can actually contribute to/ trigger  the other two (asthma and post nasal drip syndrome)  and perpetuate the cylce of cough.  While not intuitively obvious, many patients with chronic low grade reflux do not cough until there is a primary insult that disturbs the protective epithelial barrier and exposes sensitive nerve endings.   This is typically viral but can due to PNDS and  either may apply here.   The point is that once this occurs, it is difficult to eliminate the cycle  using anything but a maximally effective acid suppression regimen at least in the short run, accompanied by an appropriate diet to address non acid GERD and control / eliminate the cough itself with gabapentin  as above and luden's cough drops to suppress urge to clear throat  F/u 4 weeks with all meds in hand using a trust but verify approach to confirm accurate Medication  Reconciliation The principal here is that until we are certain that the  patients are doing what we've asked, it makes no sense to ask them to do more.   Each maintenance medication was reviewed in detail including emphasizing most importantly the difference between maintenance and prns and under what circumstances the prns are to be triggered using an action plan format where appropriate.  Total time for H and P, chart review, counseling, reviewing hfa  device(s) , directly observing portions of ambulatory 02 saturation study/ and generating customized AVS unique to this office visit / same day charting = 34  min

## 2023-04-17 NOTE — Patient Instructions (Addendum)
 Ok to try albuterol   nebulizer 15 min before an activity (on alternating days)  that you know would usually make you short of breath and see if it makes any difference and if makes none then don't take albuterol  after activity unless you can't catch your breath as this means it's the resting that helps, not the albuterol .  Pantoprazole  40 mg Take 30- 60 min before your first and last meals of the day   Try luden's cough drops    Gabapentin  100 mg three times daily and add one pill every few days until you get to 300 mg three times daily    Please remember to go to the lab department   for your tests - we will call you with the results when they are available.      Please remember to go to the  x-ray department  @  [facility] for your tests - we will call you with the results when they are available     Please schedule a follow up office visit in 4 weeks, sooner if needed

## 2023-04-17 NOTE — Assessment & Plan Note (Signed)
 Active smoker / onset of symptoms around 2006 - Spirometry 08/18/2021  FEV1 2.4 (63%)  Ratio 0.68 p am symb/spiriva  though technique poor - Allergy screen 08/18/2021 >  Eos 0.1 /  IgE  382 - Labs ordered 08/18/2021  :     alpha one AT phenotype  MM  Level 194 - 08/18/2021   Walked on RA  x  2  lap(s) =  approx 500  ft  @ slow due to hip pace, stopped due to hip pain s sob with lowest 02 sats 97%    - 08/18/2021  After extensive coaching inhaler device,  effectiveness =    75% symb/spriva vs breztri  - 02/01/2022  After extensive coaching inhaler device,  effectiveness =    75% (short ti) > continue breztri     - 10/12/2021   Walked on RA  x  3  lap(s) =  approx 450  ft  @ mod pace, stopped due to end of study  with lowest 02 sats 96% sob on 3rd lap   - 02/01/2022  After extensive coaching inhaler device,  effectiveness =    90% > continue breztri  and try off entresto   - 04/17/2023  After extensive coaching inhaler device,  effectiveness =  90%  - 04/17/2023   Walked on RA  x  2  lap(s) =  approx 300  ft  @ mod pace, stopped due to cough/ light headed with lowest 02 sats 90%    Group D (now reclassified as E) in terms of symptom/risk and laba/lama/ICS  therefore appropriate rx at this point >>>  breztri  and approp saba: Re SABA :  I spent extra time with pt today reviewing appropriate use of albuterol  for prn use on exertion with the following points: 1) saba is for relief of sob that does not improve by walking a slower pace or resting but rather if the pt does not improve after trying this first. 2) If the pt is convinced, as many are, that saba helps recover from activity faster then it's easy to tell if this is the case by re-challenging : ie stop, take the inhaler, then p 5 minutes try the exact same activity (intensity of workload) that just caused the symptoms and see if they are substantially diminished or not after saba 3) if there is an activity that reproducibly causes the symptoms, try the saba 15 min  before the activity on alternate days   If in fact the saba really does help, then fine to continue to use it prn but advised may need to look closer at the maintenance regimen being used to achieve better control of airways disease with exertion.

## 2023-04-20 ENCOUNTER — Telehealth: Payer: Self-pay

## 2023-04-20 LAB — IGE: IgE (Immunoglobulin E), Serum: 337 [IU]/mL (ref 6–495)

## 2023-04-20 LAB — D-DIMER, QUANTITATIVE: D-DIMER: 0.61 mg{FEU}/L — ABNORMAL HIGH (ref 0.00–0.49)

## 2023-04-20 LAB — BRAIN NATRIURETIC PEPTIDE: BNP: 192.8 pg/mL — ABNORMAL HIGH (ref 0.0–100.0)

## 2023-04-20 NOTE — Telephone Encounter (Signed)
-----   Message from Kevin Smith sent at 04/20/2023 12:52 PM EST ----- Call patient :  Studies are  remarkable for moderate allergy signal but lower than prior and  not specified - no need to change rx  Be sure patient has/keeps f/u ov so we can go over all the details of this study and get a plan together moving forward - ok to move up f/u if not feeling better and wants to be seen sooner

## 2023-04-20 NOTE — Telephone Encounter (Signed)
 Lvm for pt to call us  back regarding his lab results

## 2023-04-24 ENCOUNTER — Telehealth: Payer: Self-pay

## 2023-04-24 NOTE — Telephone Encounter (Signed)
-----   Message from Sandrea Hughs sent at 04/20/2023 12:52 PM EST ----- Call patient :  Studies are  remarkable for moderate allergy signal but lower than prior and  not specified - no need to change rx  Be sure patient has/keeps f/u ov so we can go over all the details of this study and get a plan together moving forward - ok to move up f/u if not feeling better and wants to be seen sooner

## 2023-04-24 NOTE — Telephone Encounter (Signed)
I left a message for the patient to return my call.

## 2023-04-30 ENCOUNTER — Ambulatory Visit: Payer: Self-pay | Admitting: *Deleted

## 2023-05-12 DEATH — deceased

## 2023-05-15 ENCOUNTER — Ambulatory Visit: Payer: Medicare HMO | Admitting: Internal Medicine

## 2024-02-20 ENCOUNTER — Other Ambulatory Visit: Payer: Self-pay
